# Patient Record
Sex: Female | Born: 1940
Health system: Southern US, Community
[De-identification: ages and names within clinical notes are randomized; demographics above are authoritative.]

## PROBLEM LIST (undated history)

## (undated) DIAGNOSIS — M810 Age-related osteoporosis without current pathological fracture: Secondary | ICD-10-CM

## (undated) DIAGNOSIS — R519 Headache, unspecified: Secondary | ICD-10-CM

## (undated) DIAGNOSIS — R51 Headache: Secondary | ICD-10-CM

## (undated) DIAGNOSIS — K219 Gastro-esophageal reflux disease without esophagitis: Secondary | ICD-10-CM

## (undated) DIAGNOSIS — K449 Diaphragmatic hernia without obstruction or gangrene: Secondary | ICD-10-CM

## (undated) DIAGNOSIS — G988 Other disorders of nervous system: Secondary | ICD-10-CM

## (undated) DIAGNOSIS — C50919 Malignant neoplasm of unspecified site of unspecified female breast: Secondary | ICD-10-CM

## (undated) DIAGNOSIS — K5790 Diverticulosis of intestine, part unspecified, without perforation or abscess without bleeding: Secondary | ICD-10-CM

## (undated) DIAGNOSIS — D126 Benign neoplasm of colon, unspecified: Secondary | ICD-10-CM

## (undated) DIAGNOSIS — N319 Neuromuscular dysfunction of bladder, unspecified: Secondary | ICD-10-CM

## (undated) DIAGNOSIS — G4700 Insomnia, unspecified: Secondary | ICD-10-CM

## (undated) DIAGNOSIS — D6851 Activated protein C resistance: Secondary | ICD-10-CM

## (undated) DIAGNOSIS — R32 Unspecified urinary incontinence: Secondary | ICD-10-CM

## (undated) HISTORY — PX: OTHER SURGICAL HISTORY: SHX169

## (undated) HISTORY — DX: Unspecified urinary incontinence: R32

## (undated) HISTORY — DX: Headache: R51

## (undated) HISTORY — DX: Age-related osteoporosis without current pathological fracture: M81.0

## (undated) HISTORY — DX: Insomnia, unspecified: G47.00

## (undated) HISTORY — DX: Neuromuscular dysfunction of bladder, unspecified: N31.9

## (undated) HISTORY — DX: Other disorders of nervous system: G98.8

## (undated) HISTORY — DX: Diverticulosis of intestine, part unspecified, without perforation or abscess without bleeding: K57.90

## (undated) HISTORY — DX: Benign neoplasm of colon, unspecified: D12.6

## (undated) HISTORY — DX: Activated protein C resistance: D68.51

## (undated) HISTORY — DX: Gastro-esophageal reflux disease without esophagitis: K21.9

## (undated) HISTORY — PX: TUBAL LIGATION: SHX77

## (undated) HISTORY — DX: Diaphragmatic hernia without obstruction or gangrene: K44.9

## (undated) HISTORY — DX: Malignant neoplasm of unspecified site of unspecified female breast: C50.919

## (undated) HISTORY — DX: Headache, unspecified: R51.9

## (undated) HISTORY — PX: SALPINGOOPHORECTOMY: SHX82

---

## 1994-10-12 HISTORY — PX: PARTIAL HYSTERECTOMY: SHX80

## 2008-03-17 ENCOUNTER — Emergency Department (HOSPITAL_COMMUNITY): Admission: EM | Admit: 2008-03-17 | Discharge: 2008-03-17 | Payer: Self-pay | Admitting: Emergency Medicine

## 2008-08-28 ENCOUNTER — Encounter: Admission: RE | Admit: 2008-08-28 | Discharge: 2008-09-26 | Payer: Self-pay | Admitting: Family Medicine

## 2008-11-13 ENCOUNTER — Ambulatory Visit (HOSPITAL_COMMUNITY): Admission: RE | Admit: 2008-11-13 | Discharge: 2008-11-13 | Payer: Self-pay | Admitting: Neurology

## 2010-03-27 ENCOUNTER — Ambulatory Visit (HOSPITAL_COMMUNITY): Admission: RE | Admit: 2010-03-27 | Discharge: 2010-03-27 | Payer: Self-pay | Admitting: Gastroenterology

## 2010-10-08 ENCOUNTER — Encounter
Admission: RE | Admit: 2010-10-08 | Discharge: 2010-10-09 | Payer: Self-pay | Source: Home / Self Care | Attending: Family Medicine | Admitting: Family Medicine

## 2010-10-12 ENCOUNTER — Encounter
Admission: RE | Admit: 2010-10-12 | Discharge: 2010-11-11 | Payer: Self-pay | Source: Home / Self Care | Attending: Family Medicine | Admitting: Family Medicine

## 2010-10-13 ENCOUNTER — Encounter
Admission: RE | Admit: 2010-10-13 | Discharge: 2010-11-11 | Payer: Self-pay | Source: Home / Self Care | Attending: Family Medicine | Admitting: Family Medicine

## 2010-10-20 ENCOUNTER — Encounter: Admit: 2010-10-20 | Payer: Self-pay | Admitting: Family Medicine

## 2010-10-22 ENCOUNTER — Encounter: Admit: 2010-10-22 | Payer: Self-pay | Admitting: Family Medicine

## 2010-10-28 ENCOUNTER — Encounter: Admit: 2010-10-28 | Payer: Self-pay | Admitting: Family Medicine

## 2010-10-31 ENCOUNTER — Encounter: Admit: 2010-10-31 | Payer: Self-pay | Admitting: Family Medicine

## 2010-11-10 ENCOUNTER — Encounter: Admit: 2010-11-10 | Payer: Self-pay | Admitting: Family Medicine

## 2010-11-12 ENCOUNTER — Ambulatory Visit: Payer: Medicare Other | Attending: Family Medicine | Admitting: Physical Therapy

## 2010-11-12 ENCOUNTER — Ambulatory Visit: Payer: Medicare Other | Admitting: Physical Therapy

## 2010-11-12 DIAGNOSIS — M25559 Pain in unspecified hip: Secondary | ICD-10-CM | POA: Insufficient documentation

## 2010-11-12 DIAGNOSIS — IMO0001 Reserved for inherently not codable concepts without codable children: Secondary | ICD-10-CM | POA: Insufficient documentation

## 2010-11-12 DIAGNOSIS — M25659 Stiffness of unspecified hip, not elsewhere classified: Secondary | ICD-10-CM | POA: Insufficient documentation

## 2010-11-14 ENCOUNTER — Ambulatory Visit: Payer: Medicare Other | Admitting: Physical Therapy

## 2010-11-18 ENCOUNTER — Ambulatory Visit: Payer: Medicare Other | Admitting: Physical Therapy

## 2010-11-19 ENCOUNTER — Ambulatory Visit: Payer: Medicare Other | Admitting: Physical Therapy

## 2010-11-19 ENCOUNTER — Encounter: Payer: Medicare Other | Admitting: Physical Therapy

## 2010-11-20 ENCOUNTER — Ambulatory Visit: Payer: Medicare Other | Admitting: Physical Therapy

## 2010-11-24 ENCOUNTER — Ambulatory Visit: Payer: Medicare Other | Admitting: Physical Therapy

## 2010-11-25 ENCOUNTER — Ambulatory Visit: Payer: Medicare Other | Admitting: Physical Therapy

## 2010-11-26 ENCOUNTER — Ambulatory Visit: Payer: Medicare Other | Admitting: Physical Therapy

## 2010-11-26 ENCOUNTER — Encounter: Payer: Medicare Other | Admitting: Physical Therapy

## 2010-11-27 ENCOUNTER — Ambulatory Visit: Payer: Medicare Other | Admitting: Physical Therapy

## 2010-12-01 ENCOUNTER — Ambulatory Visit: Payer: Medicare Other | Admitting: Physical Therapy

## 2010-12-02 ENCOUNTER — Ambulatory Visit: Payer: Medicare Other | Admitting: Physical Therapy

## 2010-12-02 ENCOUNTER — Encounter: Payer: Medicare Other | Admitting: Physical Therapy

## 2010-12-04 ENCOUNTER — Ambulatory Visit: Payer: Medicare Other | Admitting: Physical Therapy

## 2010-12-09 ENCOUNTER — Ambulatory Visit: Payer: Medicare Other | Admitting: Physical Therapy

## 2011-07-09 LAB — CBC
Hemoglobin: 13.5
MCHC: 34.3
Platelets: 207
RDW: 13.7
WBC: 7.7

## 2011-07-09 LAB — DIFFERENTIAL
Basophils Relative: 0
Eosinophils Absolute: 0
Lymphs Abs: 0.6 — ABNORMAL LOW
Monocytes Absolute: 0.3
Monocytes Relative: 3

## 2011-07-09 LAB — URINALYSIS, ROUTINE W REFLEX MICROSCOPIC
Ketones, ur: >80 — AB
Nitrite: NEGATIVE
Protein, ur: NEGATIVE
Urobilinogen, UA: 0.2
pH: 7

## 2011-07-09 LAB — COMPREHENSIVE METABOLIC PANEL
ALT: 33
AST: 27
Albumin: 4
Alkaline Phosphatase: 46
Calcium: 9.2
GFR calc Af Amer: >60
GFR calc non Af Amer: >60
Potassium: 3.8
Sodium: 139
Total Protein: 6.6

## 2011-07-09 LAB — LIPASE, BLOOD: Lipase: 26

## 2012-07-28 ENCOUNTER — Other Ambulatory Visit: Payer: Self-pay | Admitting: Dermatology

## 2012-10-17 ENCOUNTER — Other Ambulatory Visit: Payer: Self-pay | Admitting: Gastroenterology

## 2012-10-17 ENCOUNTER — Ambulatory Visit
Admission: RE | Admit: 2012-10-17 | Discharge: 2012-10-17 | Disposition: A | Discharge status: Home / Self Care | Payer: Medicare Other | Source: Ambulatory Visit | Attending: Gastroenterology | Admitting: Gastroenterology

## 2012-10-17 DIAGNOSIS — R05 Cough: Secondary | ICD-10-CM

## 2012-10-17 DIAGNOSIS — R059 Cough, unspecified: Secondary | ICD-10-CM

## 2012-11-01 ENCOUNTER — Other Ambulatory Visit: Payer: Self-pay | Admitting: Neurology

## 2012-11-01 DIAGNOSIS — R32 Unspecified urinary incontinence: Secondary | ICD-10-CM

## 2012-11-01 DIAGNOSIS — Q068 Other specified congenital malformations of spinal cord: Secondary | ICD-10-CM

## 2012-11-07 ENCOUNTER — Ambulatory Visit
Admission: RE | Admit: 2012-11-07 | Discharge: 2012-11-07 | Disposition: A | Discharge status: Home / Self Care | Payer: Medicare Other | Source: Ambulatory Visit | Attending: Neurology | Admitting: Neurology

## 2012-11-07 DIAGNOSIS — Q068 Other specified congenital malformations of spinal cord: Secondary | ICD-10-CM

## 2012-11-07 DIAGNOSIS — R32 Unspecified urinary incontinence: Secondary | ICD-10-CM

## 2012-11-07 MED ORDER — GADOBENATE DIMEGLUMINE 529 MG/ML IV SOLN
12.0000 mL | Freq: Once | INTRAVENOUS | Status: AC | PRN
  Administered 2012-11-07: 12 mL via INTRAVENOUS

## 2013-02-22 DIAGNOSIS — D179 Benign lipomatous neoplasm, unspecified: Secondary | ICD-10-CM | POA: Insufficient documentation

## 2013-06-30 ENCOUNTER — Other Ambulatory Visit: Payer: Self-pay | Admitting: Gastroenterology

## 2013-08-14 ENCOUNTER — Other Ambulatory Visit: Payer: Self-pay | Admitting: Dermatology

## 2014-03-20 ENCOUNTER — Ambulatory Visit (INDEPENDENT_AMBULATORY_CARE_PROVIDER_SITE_OTHER)
Admission: RE | Admit: 2014-03-20 | Discharge: 2014-03-20 | Disposition: A | Discharge status: Home / Self Care | Payer: Commercial Managed Care - HMO | Source: Ambulatory Visit | Attending: Emergency Medicine | Admitting: Emergency Medicine

## 2014-03-20 ENCOUNTER — Encounter: Payer: Self-pay | Admitting: Emergency Medicine

## 2014-03-20 ENCOUNTER — Ambulatory Visit (INDEPENDENT_AMBULATORY_CARE_PROVIDER_SITE_OTHER): Payer: Commercial Managed Care - HMO | Admitting: Emergency Medicine

## 2014-03-20 VITALS — BP 128/88 | HR 77 | Ht 63.5 in | Wt 138.0 lb

## 2014-03-20 DIAGNOSIS — R05 Cough: Secondary | ICD-10-CM

## 2014-03-20 DIAGNOSIS — R059 Cough, unspecified: Secondary | ICD-10-CM

## 2014-03-20 MED ORDER — FLUTICASONE PROPIONATE 50 MCG/ACT NA SUSP: 2.0000 | Freq: Every day | NASAL | Status: DC

## 2014-03-20 MED ORDER — HYDROCODONE-HOMATROPINE 5-1.5 MG/5ML PO SYRP: 5.0000 mL | ORAL_SOLUTION | Freq: Four times a day (QID) | ORAL | Status: DC | PRN

## 2014-03-20 NOTE — Patient Instructions (Signed)
CXR today Please increase your omeprazole to twice a day Go back on your fexofenadine 180mg  daily Start fluticasone nasal spray 2 sprays daily Use hycodan as needed to suppress you cough Practice voice rest as recommended for a weekend Follow with Dr Lamonte Sakai in 1 month

## 2014-03-20 NOTE — Assessment & Plan Note (Addendum)
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 Using her voice, GERD, allergic rhinitis are all contributing and sustaining her upper airway irritation. We discussed cyclical cough and the role each of these factors was playing. I believe she needs cough suppression and voice rest while we work on eliminating her contributors especially GERD.   CXR today Please increase your omeprazole to twice a day Go back on your fexofenadine 180mg  daily Start fluticasone nasal spray 2 sprays daily Use hycodan as needed to suppress you cough Practice voice rest as recommended for a weekend Follow with Dr Lamonte Sakai in 1 month

## 2014-03-20 NOTE — Progress Notes (Signed)
Subjective:    Patient ID: Regina [name], female    DOB: May 25, 1941, 73 y.o.   MRN: [id number]  HPI 73 yo former smoker (8 pk-yr), little PMH. She is referred for chronic cough. She has had trouble with cough for years, but since May '15 she has had more. 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  It appears to be paroxysmal, is often productive of clear to white mucous. She has GERD and has moderate control. She switched omeprazole from bid to qd several months ago without any apparent worsening. She was tried on allegra, albuterol. The SABA didn't seem to help.   Review of Systems  Constitutional: Negative for fever and unexpected weight change.  HENT: Positive for dental problem and sore throat. Negative for congestion, ear pain, nosebleeds, postnasal drip, rhinorrhea, sinus pressure, sneezing and trouble swallowing.   Eyes: Negative for redness and itching.  Respiratory: Positive for shortness of breath. Negative for cough, chest tightness and wheezing.   Cardiovascular: Negative for palpitations and leg swelling.  Gastrointestinal: Positive for abdominal distention. Negative for nausea and vomiting.       Reflux   Genitourinary: Negative for dysuria.  Musculoskeletal: Negative for joint swelling.  Skin: Negative for rash.  Neurological: Negative for headaches.  Hematological: Does not bruise/bleed easily.  Psychiatric/Behavioral: Negative for dysphoric mood. The patient is not nervous/anxious.    Past Medical History  Diagnosis Date  . Nervous system disorder     spinal cord tethered     Family History  Problem Relation Age of Onset  . Asthma Mother   . COPD Mother      History   Social History  . Marital Status: Married    Spouse Name: N/A    Number of Children: N/A  . Years of Education: N/A   Occupational History  . Not on file.   Social History Main Topics  . Smoking status: Former Smoker -- 2.00 packs/day  for 4 years    Types: Cigarettes    Quit date: 12/10/1972  . Smokeless tobacco: Not on file  . Alcohol Use: Yes     Comment: social  . Drug Use: No  . Sexual Activity: Not on file   Other Topics Concern  . Not on file   Social History Narrative  . No narrative on file     Allergies  Allergen Reactions  . Ketoprofen     Stomach issues  . Morphine And Related      No outpatient prescriptions prior to visit.   No facility-administered medications prior to visit.       Objective:   Physical Exam Filed Vitals:   03/20/14 1013  BP: 128/88  Pulse: 77  Height: 5' 3.5" (1.613 m)  Weight: 138 lb (62.596 kg)  SpO2: 97%   Gen: Pleasant, well-nourished, in no distress,  normal affect  ENT: No lesions,  mouth clear,  oropharynx clear, no postnasal drip  Neck: No JVD, no TMG, no carotid bruits  Lungs: No use of accessory muscles, clear without rales or rhonchi  Cardiovascular: RRR, heart sounds normal, no murmur or gallops, no peripheral edema  Musculoskeletal: No deformities, no cyanosis or clubbing  Neuro: alert, non focal  Skin: Warm, no lesions or rashes      Assessment & Plan:  Cough Sounds multifactorial with 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 Using her voice, GERD, allergic rhinitis are all  contributing and sustaining her upper airway irritation. We discussed cyclical cough and the role each of these factors was playing. I believe she needs cough suppression and voice rest while we work on eliminating her contributors especially GERD.   CXR today Please increase your omeprazole to twice a day Go back on your fexofenadine 180mg  daily Start fluticasone nasal spray 2 sprays daily Use hycodan as needed to suppress you cough Practice voice rest as recommended for a weekend Follow with Dr Lamonte Sakai in 1 month

## 2014-04-19 ENCOUNTER — Ambulatory Visit (INDEPENDENT_AMBULATORY_CARE_PROVIDER_SITE_OTHER): Payer: Commercial Managed Care - HMO | Admitting: Emergency Medicine

## 2014-04-19 ENCOUNTER — Encounter: Payer: Self-pay | Admitting: Emergency Medicine

## 2014-04-19 VITALS — BP 122/78 | HR 77 | Ht 63.5 in | Wt 138.0 lb

## 2014-04-19 DIAGNOSIS — R05 Cough: Secondary | ICD-10-CM

## 2014-04-19 DIAGNOSIS — R059 Cough, unspecified: Secondary | ICD-10-CM

## 2014-04-19 MED ORDER — PANTOPRAZOLE SODIUM 40 MG PO TBEC: 40.0000 mg | DELAYED_RELEASE_TABLET | Freq: Every day | ORAL | Status: DC

## 2014-04-19 NOTE — Progress Notes (Signed)
   Subjective:    Patient ID: Regina [name], female    DOB: October 22, 1940, 73 y.o.   MRN: [id number]  Cough Associated symptoms include a sore throat and shortness of breath. Pertinent negatives include no ear pain, eye redness, fever, headaches, postnasal drip, rash, rhinorrhea or wheezing.   73 yo former smoker (8 pk-yr), little PMH. She is referred for chronic cough. She has had trouble with cough for years, but since May '15 she has had more. 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  It appears to be paroxysmal, is often productive of clear to white mucous. She has GERD and has moderate control. She switched omeprazole from bid to qd several months ago without any apparent worsening. She was tried on allegra, albuterol. The SABA didn't seem to help.   ROV 04/19/14 -- follows for chronic cough, suspected allergic rhinitis. We empirically increased omeprazole, added fluticasone (taking prn) to fexofenadine. She had itching with hydrcodone, could not take. She has continued to cough - was treated for an acute bronchitis end of June with pred and azithro.    Review of Systems  Constitutional: Negative for fever and unexpected weight change.  HENT: Positive for dental problem and sore throat. Negative for congestion, ear pain, nosebleeds, postnasal drip, rhinorrhea, sinus pressure, sneezing and trouble swallowing.   Eyes: Negative for redness and itching.  Respiratory: Positive for cough and shortness of breath. Negative for chest tightness and wheezing.   Cardiovascular: Negative for palpitations and leg swelling.  Gastrointestinal: Positive for abdominal distention. Negative for nausea and vomiting.       Reflux   Genitourinary: Negative for dysuria.  Musculoskeletal: Negative for joint swelling.  Skin: Negative for rash.  Neurological: Negative for headaches.  Hematological: Does not bruise/bleed easily.  Psychiatric/Behavioral: Negative for  dysphoric mood. The patient is not nervous/anxious.        Objective:   Physical Exam Filed Vitals:   04/19/14 0946  BP: 122/78  Pulse: 77  Height: 5' 3.5" (1.613 m)  Weight: 138 lb (62.596 kg)  SpO2: 100%   Gen: Pleasant, well-nourished, in no distress,  normal affect  ENT: No lesions,  mouth clear,  oropharynx clear, no postnasal drip  Neck: No JVD, no TMG, no carotid bruits  Lungs: No use of accessory muscles, clear without rales or rhonchi  Cardiovascular: RRR, heart sounds normal, no murmur or gallops, no peripheral edema  Musculoskeletal: No deformities, no cyanosis or clubbing  Neuro: alert, non focal  Skin: Warm, no lesions or rashes      Assessment & Plan:  Cough - we will perform full PFT - continue allegra, start taking nasal steroid every day  - raise head of bed - GERD precautions.  - change omeprazole to pantoprazole  - follow 1 month

## 2014-04-19 NOTE — Patient Instructions (Signed)
Please continue allegra Start taking your fluticasone nasal spray every day Please stop your omeprazole. We will start pantoprazole 40mg . Take this twice a day for the first 10 days, then change to daily. If you have any reflux symptoms on the lower dose then go back to twice a day We will perform full PFT at your next office visit Follow with Dr Lamonte Sakai in 1 month with full PFT

## 2014-04-19 NOTE — Assessment & Plan Note (Signed)
-   we will perform full PFT - continue allegra, start taking nasal steroid every day  - raise head of bed - GERD precautions.  - change omeprazole to pantoprazole  - follow 1 month

## 2014-04-24 ENCOUNTER — Telehealth: Payer: Self-pay | Admitting: Emergency Medicine

## 2014-04-24 ENCOUNTER — Encounter (INDEPENDENT_AMBULATORY_CARE_PROVIDER_SITE_OTHER): Payer: Self-pay

## 2014-04-24 NOTE — Telephone Encounter (Signed)
Spoke with pt and advised her of RB's rec's. Confirmed appointment for 06/05/14 and advised pt to call if symptoms no better within 10 days. Pt verbalized understanding

## 2014-04-24 NOTE — Telephone Encounter (Signed)
Per OV 04/19/14; Please continue allegra Start taking your fluticasone nasal spray every day Please stop your omeprazole. We will start pantoprazole 40mg . Take this twice a day for the first 10 days, then change to daily. If you have any reflux symptoms on the lower dose then go back to twice a day We will perform full PFT at your next office visit Follow with Dr Lamonte Sakai in 1 month with full PFT --   Called spoke with pt. She reports the new PPI she was placed on, she is now coughing more than when she was on the omperazole. It is a constant cough and happens all during the day and night. Pt is requesting further recs. Please advise RB thanks  Allergies  Allergen Reactions  . Ketoprofen     Stomach issues  . Morphine And Related

## 2014-04-24 NOTE — Telephone Encounter (Signed)
There could be multiple reasons why she is coughing more, but I would like for her to stop the pantoprazole and go back to omeprazole. Please have her make this change and ensure that she has followup with me

## 2014-06-05 ENCOUNTER — Encounter: Payer: Self-pay | Admitting: Emergency Medicine

## 2014-06-05 ENCOUNTER — Ambulatory Visit (INDEPENDENT_AMBULATORY_CARE_PROVIDER_SITE_OTHER): Payer: Commercial Managed Care - HMO | Admitting: Emergency Medicine

## 2014-06-05 VITALS — BP 118/80 | HR 70 | Temp 98.2°F | Ht 63.0 in | Wt 140.0 lb

## 2014-06-05 DIAGNOSIS — R059 Cough, unspecified: Secondary | ICD-10-CM

## 2014-06-05 DIAGNOSIS — R05 Cough: Secondary | ICD-10-CM

## 2014-06-05 DIAGNOSIS — J4520 Mild intermittent asthma, uncomplicated: Secondary | ICD-10-CM

## 2014-06-05 DIAGNOSIS — J45909 Unspecified asthma, uncomplicated: Secondary | ICD-10-CM

## 2014-06-05 LAB — PULMONARY FUNCTION TEST
DL/VA % pred: 96 %
DL/VA: 4.52 ml/min/mmHg/L
DLCO unc % pred: 77 %
DLCO unc: 17.68 ml/min/mmHg
FEF 25-75 PRE: 1.57 L/s
FEF 25-75 Post: 2.4 L/sec
FEF2575-%CHANGE-POST: 53 %
FEF2575-%Pred-Post: 132 %
FEF2575-%Pred-Pre: 86 %
FEV1-%CHANGE-POST: 14 %
FEV1-%PRED-PRE: 84 %
FEV1-%Pred-Post: 97 %
FEV1-Post: 2.09 L
FEV1-Pre: 1.82 L
FEV1FVC-%CHANGE-POST: 3 %
FEV1FVC-%Pred-Pre: 103 %
FEV6-%Change-Post: 11 %
FEV6-%PRED-POST: 94 %
FEV6-%Pred-Pre: 84 %
FEV6-PRE: 2.31 L
FEV6-Post: 2.58 L
FEV6FVC-%Change-Post: 0 %
FEV6FVC-%Pred-Post: 105 %
FEV6FVC-%Pred-Pre: 104 %
FVC-%Change-Post: 11 %
FVC-%Pred-Post: 90 %
FVC-%Pred-Pre: 81 %
FVC-POST: 2.58 L
FVC-Pre: 2.32 L
POST FEV1/FVC RATIO: 81 %
POST FEV6/FVC RATIO: 100 %
Pre FEV1/FVC ratio: 79 %
Pre FEV6/FVC Ratio: 100 %
RV % pred: 84 %
RV: 1.83 L
TLC % PRED: 87 %
TLC: 4.27 L

## 2014-06-05 NOTE — Assessment & Plan Note (Signed)
-   continue rx for allergies, decrease omeprazole back to qd - use albuterol to treat any component of lower airways disease.  - rov 6

## 2014-06-05 NOTE — Patient Instructions (Signed)
We will continue your fluticasone nasal spray, 1 spray twice a day Continue your allegra Decrease your omeprazole to once a day Try using albuterol 2 puffs if needed for coughing spells or shortness of breath Follow with Dr Lamonte Sakai in 6 months or sooner if you have any problems

## 2014-06-05 NOTE — Assessment & Plan Note (Signed)
Based on BD response on PFT today. Suspect her main manifestation (if any) is paroxysms of cough.  - will try to target her albuterol use to her coughing spells, see if she benefits.

## 2014-06-05 NOTE — Progress Notes (Signed)
PFT done today. 

## 2014-06-05 NOTE — Progress Notes (Signed)
   Subjective:    Patient ID: Regina [name], female    DOB: 1941-08-05, 73 y.o.   MRN: [id number]  Cough Associated symptoms include a sore throat and shortness of breath. Pertinent negatives include no ear pain, eye redness, fever, headaches, postnasal drip, rash, rhinorrhea or wheezing.   73 yo former smoker (8 pk-yr), little PMH. She is referred for chronic cough. She has had trouble with cough for years, but since May '15 she has had more. 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  It appears to be paroxysmal, is often productive of clear to white mucous. She has GERD and has moderate control. She switched omeprazole from bid to qd several months ago without any apparent worsening. She was tried on allegra, albuterol. The SABA didn't seem to help.   ROV 04/19/14 -- follows for chronic cough, suspected allergic rhinitis. We empirically increased omeprazole, added fluticasone (taking prn) to fexofenadine. She had itching with hydrcodone, could not take. She has continued to cough - was treated for an acute bronchitis end of June with pred and azithro.   ROV 06/05/14 -- follow up visit for cough in setting rhinitis, GERD. We performed PFT to assess for AFL today >> Mild AFL based on positive BD response, normal volumes, decreased DLCO with correction. She tells me that she has been doing better, less cough. She believes that she benefited from the fluticasone. She wasn't able to stay on the pantoprazole, back to omeprazole.    Review of Systems  Constitutional: Negative for fever and unexpected weight change.  HENT: Positive for dental problem and sore throat. Negative for congestion, ear pain, nosebleeds, postnasal drip, rhinorrhea, sinus pressure, sneezing and trouble swallowing.   Eyes: Negative for redness and itching.  Respiratory: Positive for cough and shortness of breath. Negative for chest tightness and wheezing.   Cardiovascular: Negative  for palpitations and leg swelling.  Gastrointestinal: Positive for abdominal distention. Negative for nausea and vomiting.       Reflux   Genitourinary: Negative for dysuria.  Musculoskeletal: Negative for joint swelling.  Skin: Negative for rash.  Neurological: Negative for headaches.  Hematological: Does not bruise/bleed easily.  Psychiatric/Behavioral: Negative for dysphoric mood. The patient is not nervous/anxious.        Objective:   Physical Exam Filed Vitals:   06/05/14 1103  BP: 118/80  Pulse: 70  Temp: 98.2 F (36.8 C)  TempSrc: Oral  Height: 5\' 3"  (1.6 m)  Weight: 140 lb (63.504 kg)  SpO2: 99%   Gen: Pleasant, well-nourished, in no distress,  normal affect  ENT: No lesions,  mouth clear,  oropharynx clear, no postnasal drip  Neck: No JVD, no TMG, no carotid bruits  Lungs: No use of accessory muscles, clear without rales or rhonchi  Cardiovascular: RRR, heart sounds normal, no murmur or gallops, no peripheral edema  Musculoskeletal: No deformities, no cyanosis or clubbing  Neuro: alert, non focal  Skin: Warm, no lesions or rashes      Assessment & Plan:  Asthma, mild intermittent Based on BD response on PFT today. Suspect her main manifestation (if any) is paroxysms of cough.  - will try to target her albuterol use to her coughing spells, see if she benefits.   Cough - continue rx for allergies, decrease omeprazole back to qd - use albuterol to treat any component of lower airways disease.  - rov 6

## 2014-07-19 ENCOUNTER — Other Ambulatory Visit (HOSPITAL_COMMUNITY): Payer: Self-pay | Admitting: Urology

## 2014-07-19 DIAGNOSIS — N281 Cyst of kidney, acquired: Secondary | ICD-10-CM

## 2014-08-01 ENCOUNTER — Other Ambulatory Visit (HOSPITAL_COMMUNITY): Payer: Self-pay | Admitting: Urology

## 2014-08-01 ENCOUNTER — Ambulatory Visit (HOSPITAL_COMMUNITY)
Admission: RE | Admit: 2014-08-01 | Discharge: 2014-08-01 | Disposition: A | Discharge status: Home / Self Care | Payer: Medicare PPO | Source: Ambulatory Visit | Attending: Urology | Admitting: Urology

## 2014-08-01 DIAGNOSIS — D3502 Benign neoplasm of left adrenal gland: Secondary | ICD-10-CM | POA: Diagnosis not present

## 2014-08-01 DIAGNOSIS — K7689 Other specified diseases of liver: Secondary | ICD-10-CM | POA: Diagnosis not present

## 2014-08-01 DIAGNOSIS — N289 Disorder of kidney and ureter, unspecified: Secondary | ICD-10-CM | POA: Insufficient documentation

## 2014-08-01 DIAGNOSIS — N281 Cyst of kidney, acquired: Secondary | ICD-10-CM

## 2014-08-01 MED ORDER — GADOBENATE DIMEGLUMINE 529 MG/ML IV SOLN
13.0000 mL | Freq: Once | INTRAVENOUS | Status: AC | PRN
  Administered 2014-08-01: 13 mL via INTRAVENOUS

## 2014-09-19 NOTE — Progress Notes (Signed)
Quick Note:    Discussed at OV  ______

## 2014-11-07 ENCOUNTER — Other Ambulatory Visit: Payer: Self-pay | Admitting: Dermatology

## 2014-12-10 ENCOUNTER — Encounter: Payer: Self-pay | Admitting: Emergency Medicine

## 2014-12-24 ENCOUNTER — Ambulatory Visit: Payer: Commercial Managed Care - HMO | Admitting: Emergency Medicine

## 2015-01-31 ENCOUNTER — Encounter: Payer: Self-pay | Admitting: Emergency Medicine

## 2015-01-31 ENCOUNTER — Ambulatory Visit (INDEPENDENT_AMBULATORY_CARE_PROVIDER_SITE_OTHER): Payer: PPO | Admitting: Emergency Medicine

## 2015-01-31 VITALS — BP 124/64 | HR 82 | Ht 67.0 in | Wt 142.6 lb

## 2015-01-31 DIAGNOSIS — R05 Cough: Secondary | ICD-10-CM

## 2015-01-31 DIAGNOSIS — J4520 Mild intermittent asthma, uncomplicated: Secondary | ICD-10-CM

## 2015-01-31 DIAGNOSIS — R059 Cough, unspecified: Secondary | ICD-10-CM

## 2015-01-31 MED ORDER — FEXOFENADINE HCL 180 MG PO TABS: 180.0000 mg | ORAL_TABLET | Freq: Every day | ORAL | Status: DC

## 2015-01-31 NOTE — Assessment & Plan Note (Signed)
Continue albuterol as needed 

## 2015-01-31 NOTE — Assessment & Plan Note (Signed)
Continue to suspect that this is largely due to upper airway irritation. She has been on and off her PPI and her allergy routine. She currently inhales and essential oil which concerns me for a risk for lipoid pneumonia. I think the best approach currently would be to restart her omeprazole twice a day, encouraged her to adhere to a GERD diet and reflux precautions. We will also restart her fluticasone nasal spray and her fexofenadine. She can continue to use her albuterol if needed. If she does not improve then I believe she needs an airway inspection with bronchoscopy. We will discuss this at her next visit.

## 2015-01-31 NOTE — Progress Notes (Signed)
Subjective:    Patient ID: Regina [name], female    DOB: [birth year], 74 y.o.   MRN: [id number]  Cough Associated symptoms include shortness of breath. Pertinent negatives include no ear pain, eye redness, fever, headaches, postnasal drip, rash, rhinorrhea, sore throat or wheezing.   74 yo former smoker (8 pk-yr), little PMH. She is referred for chronic cough. She has had trouble with cough for years, but since May '15 she has had more. 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  It appears to be paroxysmal, is often productive of clear to white mucous. She has GERD and has moderate control. She switched omeprazole from bid to qd several months ago without any apparent worsening. She was tried on allegra, albuterol. The SABA didn't seem to help.   ROV 04/19/14 -- follows for chronic cough, suspected allergic rhinitis. We empirically increased omeprazole, added fluticasone (taking prn) to fexofenadine. She had itching with hydrcodone, could not take. She has continued to cough - was treated for an acute bronchitis end of June with pred and azithro.   ROV 06/05/14 -- follow up visit for cough in setting rhinitis, GERD. We performed PFT to assess for AFL today >> Mild AFL based on positive BD response, normal volumes, decreased DLCO with correction. She tells me that she has been doing better, less cough. She believes that she benefited from the fluticasone. She wasn't able to stay on the pantoprazole, back to omeprazole.   ROV 01/31/15 -- Follow-up visit for mild obstructive lung disease and cough in the setting of allergic rhinitis and GERD.  She has a positive bronchodilator response on her pulmonary function testing. She has some spells of coughing that seem to happen after breakfast, when sitting up in bed. She is taking her omeprazole 40mg  in the evening before supper. She has purchased an air filter. She has used albuterol with some success. She  stopped allegra, uses flonase prn.    Review of Systems  Constitutional: Negative for fever and unexpected weight change.  HENT: Negative for congestion, dental problem, ear pain, nosebleeds, postnasal drip, rhinorrhea, sinus pressure, sneezing, sore throat and trouble swallowing.   Eyes: Negative for redness and itching.  Respiratory: Positive for cough and shortness of breath. Negative for chest tightness and wheezing.   Cardiovascular: Negative for palpitations and leg swelling.  Gastrointestinal: Negative for nausea, vomiting and abdominal distention.       Reflux   Genitourinary: Negative for dysuria.  Musculoskeletal: Negative for joint swelling.  Skin: Negative for rash.  Neurological: Negative for headaches.  Hematological: Does not bruise/bleed easily.  Psychiatric/Behavioral: Negative for dysphoric mood. The patient is not nervous/anxious.        Objective:   Physical Exam Filed Vitals:   01/31/15 1625  BP: 124/64  Pulse: 82  Height: 5\' 7"  (1.702 m)  Weight: 142 lb 9.6 oz (64.683 kg)  SpO2: 100%    Gen: Pleasant, well-nourished, in no distress,  normal affect  ENT: No lesions,  mouth clear,  oropharynx narrow but clear, no postnasal drip  Neck: No JVD, no TMG, no carotid bruits  Lungs: No use of accessory muscles, clear without rales or rhonchi  Cardiovascular: RRR, heart sounds normal, no murmur or gallops, no peripheral edema  Musculoskeletal: No deformities, no cyanosis or clubbing   Neuro: alert, non focal  Skin: Warm, no lesions or rashes      Assessment & Plan:  Cough Continue to suspect that  this is largely due to upper airway irritation. She has been on and off her PPI and her allergy routine. She currently inhales and essential oil which concerns me for a risk for lipoid pneumonia. I think the best approach currently would be to restart her omeprazole twice a day, encouraged her to adhere to a GERD diet and reflux precautions. We will also  restart her fluticasone nasal spray and her fexofenadine. She can continue to use her albuterol if needed. If she does not improve then I believe she needs an airway inspection with bronchoscopy. We will discuss this at her next visit.    Asthma, mild intermittent Continue albuterol as needed    Over 75% of this 30 minute visit was spent counseling and discussing strategies to avoid upper airway irritation and cough.

## 2015-01-31 NOTE — Patient Instructions (Signed)
Please increase your omeprazole back to 40mg  twice a day Restart your flonase every day Start fexofenadine 180mg  daily Use albuterol 2 puffs as needed for your breathing or for cough We may consider performing a bronchoscopy (airway inspection) at some point in the future.  Follow with Dr Lamonte Sakai in 3 months or sooner if you have any problems.

## 2015-03-07 ENCOUNTER — Other Ambulatory Visit: Payer: Self-pay | Admitting: Emergency Medicine

## 2015-08-01 DIAGNOSIS — H02831 Dermatochalasis of right upper eyelid: Secondary | ICD-10-CM | POA: Insufficient documentation

## 2015-10-17 DIAGNOSIS — N281 Cyst of kidney, acquired: Secondary | ICD-10-CM | POA: Diagnosis not present

## 2015-11-13 DIAGNOSIS — Z23 Encounter for immunization: Secondary | ICD-10-CM | POA: Diagnosis not present

## 2015-11-13 DIAGNOSIS — D225 Melanocytic nevi of trunk: Secondary | ICD-10-CM | POA: Diagnosis not present

## 2015-11-13 DIAGNOSIS — D485 Neoplasm of uncertain behavior of skin: Secondary | ICD-10-CM | POA: Diagnosis not present

## 2015-11-13 DIAGNOSIS — Z85828 Personal history of other malignant neoplasm of skin: Secondary | ICD-10-CM | POA: Diagnosis not present

## 2015-11-13 DIAGNOSIS — L821 Other seborrheic keratosis: Secondary | ICD-10-CM | POA: Diagnosis not present

## 2015-11-13 DIAGNOSIS — L309 Dermatitis, unspecified: Secondary | ICD-10-CM | POA: Diagnosis not present

## 2015-11-13 DIAGNOSIS — L570 Actinic keratosis: Secondary | ICD-10-CM | POA: Diagnosis not present

## 2015-12-10 DIAGNOSIS — M8589 Other specified disorders of bone density and structure, multiple sites: Secondary | ICD-10-CM | POA: Diagnosis not present

## 2016-01-27 DIAGNOSIS — K30 Functional dyspepsia: Secondary | ICD-10-CM | POA: Diagnosis not present

## 2016-01-27 DIAGNOSIS — Z86010 Personal history of colonic polyps: Secondary | ICD-10-CM | POA: Diagnosis not present

## 2016-01-27 DIAGNOSIS — Z131 Encounter for screening for diabetes mellitus: Secondary | ICD-10-CM | POA: Diagnosis not present

## 2016-01-27 DIAGNOSIS — Z7189 Other specified counseling: Secondary | ICD-10-CM | POA: Diagnosis not present

## 2016-01-27 DIAGNOSIS — Z1389 Encounter for screening for other disorder: Secondary | ICD-10-CM | POA: Diagnosis not present

## 2016-01-27 DIAGNOSIS — N319 Neuromuscular dysfunction of bladder, unspecified: Secondary | ICD-10-CM | POA: Diagnosis not present

## 2016-01-27 DIAGNOSIS — M85851 Other specified disorders of bone density and structure, right thigh: Secondary | ICD-10-CM | POA: Diagnosis not present

## 2016-01-27 DIAGNOSIS — Q069 Congenital malformation of spinal cord, unspecified: Secondary | ICD-10-CM | POA: Diagnosis not present

## 2016-01-27 DIAGNOSIS — G43109 Migraine with aura, not intractable, without status migrainosus: Secondary | ICD-10-CM | POA: Diagnosis not present

## 2016-01-27 DIAGNOSIS — R05 Cough: Secondary | ICD-10-CM | POA: Diagnosis not present

## 2016-01-27 DIAGNOSIS — Z0000 Encounter for general adult medical examination without abnormal findings: Secondary | ICD-10-CM | POA: Diagnosis not present

## 2016-01-27 DIAGNOSIS — M25552 Pain in left hip: Secondary | ICD-10-CM | POA: Diagnosis not present

## 2016-02-06 DIAGNOSIS — M25552 Pain in left hip: Secondary | ICD-10-CM | POA: Diagnosis not present

## 2016-02-06 DIAGNOSIS — M25562 Pain in left knee: Secondary | ICD-10-CM | POA: Diagnosis not present

## 2016-03-13 DIAGNOSIS — G43801 Other migraine, not intractable, with status migrainosus: Secondary | ICD-10-CM | POA: Diagnosis not present

## 2016-03-13 DIAGNOSIS — H16223 Keratoconjunctivitis sicca, not specified as Sjogren's, bilateral: Secondary | ICD-10-CM | POA: Diagnosis not present

## 2016-03-13 DIAGNOSIS — Z83511 Family history of glaucoma: Secondary | ICD-10-CM | POA: Diagnosis not present

## 2016-03-13 DIAGNOSIS — H40023 Open angle with borderline findings, high risk, bilateral: Secondary | ICD-10-CM | POA: Diagnosis not present

## 2016-03-13 DIAGNOSIS — H2513 Age-related nuclear cataract, bilateral: Secondary | ICD-10-CM | POA: Diagnosis not present

## 2016-03-19 DIAGNOSIS — M25552 Pain in left hip: Secondary | ICD-10-CM | POA: Diagnosis not present

## 2016-03-19 DIAGNOSIS — M25562 Pain in left knee: Secondary | ICD-10-CM | POA: Diagnosis not present

## 2016-03-23 DIAGNOSIS — M25552 Pain in left hip: Secondary | ICD-10-CM | POA: Diagnosis not present

## 2016-03-23 DIAGNOSIS — M25562 Pain in left knee: Secondary | ICD-10-CM | POA: Diagnosis not present

## 2016-03-25 DIAGNOSIS — M25552 Pain in left hip: Secondary | ICD-10-CM | POA: Diagnosis not present

## 2016-03-25 DIAGNOSIS — M25562 Pain in left knee: Secondary | ICD-10-CM | POA: Diagnosis not present

## 2016-03-30 DIAGNOSIS — M25552 Pain in left hip: Secondary | ICD-10-CM | POA: Diagnosis not present

## 2016-03-30 DIAGNOSIS — M25562 Pain in left knee: Secondary | ICD-10-CM | POA: Diagnosis not present

## 2016-04-01 DIAGNOSIS — M25562 Pain in left knee: Secondary | ICD-10-CM | POA: Diagnosis not present

## 2016-04-01 DIAGNOSIS — M25552 Pain in left hip: Secondary | ICD-10-CM | POA: Diagnosis not present

## 2016-04-06 ENCOUNTER — Ambulatory Visit (INDEPENDENT_AMBULATORY_CARE_PROVIDER_SITE_OTHER): Payer: PPO | Admitting: Neurology

## 2016-04-06 ENCOUNTER — Encounter: Payer: Self-pay | Admitting: Neurology

## 2016-04-06 VITALS — BP 92/68 | HR 66 | Ht 63.5 in | Wt 130.6 lb

## 2016-04-06 DIAGNOSIS — M25562 Pain in left knee: Secondary | ICD-10-CM | POA: Diagnosis not present

## 2016-04-06 DIAGNOSIS — G43109 Migraine with aura, not intractable, without status migrainosus: Secondary | ICD-10-CM

## 2016-04-06 DIAGNOSIS — M25552 Pain in left hip: Secondary | ICD-10-CM | POA: Diagnosis not present
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
## 2016-04-06 NOTE — Patient Instructions (Addendum)
Remember to drink plenty of fluid, eat healthy meals and do not skip any meals. Try to eat protein with a every meal and eat a healthy snack such as fruit or nuts in between meals. Try to keep a regular sleep-wake schedule and try to exercise daily, particularly in the form of walking, 20-30 minutes a day, if you can.   As far as diagnostic testing: Will review all the records and get back to you over the phone  Will review all the records and give you a call back.   Our phone number is [phone]. We also have an after hours call service for urgent matters and there is a physician on-call for urgent questions. For any emergencies you know to call 911 or go to the nearest emergency room

## 2016-04-08 DIAGNOSIS — M25552 Pain in left hip: Secondary | ICD-10-CM | POA: Diagnosis not present

## 2016-04-08 DIAGNOSIS — M25562 Pain in left knee: Secondary | ICD-10-CM | POA: Diagnosis not present

## 2016-04-13 ENCOUNTER — Encounter: Payer: Self-pay | Admitting: Neurology

## 2016-04-13 DIAGNOSIS — G43109 Migraine with aura, not intractable, without status migrainosus: Secondary | ICD-10-CM | POA: Insufficient documentation

## 2016-04-13 DIAGNOSIS — M25552 Pain in left hip: Secondary | ICD-10-CM | POA: Diagnosis not present

## 2016-04-13 DIAGNOSIS — M25562 Pain in left knee: Secondary | ICD-10-CM | POA: Diagnosis not present

## 2016-04-16 DIAGNOSIS — M25552 Pain in left hip: Secondary | ICD-10-CM | POA: Diagnosis not present

## 2016-04-16 DIAGNOSIS — M25562 Pain in left knee: Secondary | ICD-10-CM | POA: Diagnosis not present

## 2016-04-20 DIAGNOSIS — M25552 Pain in left hip: Secondary | ICD-10-CM | POA: Diagnosis not present

## 2016-04-20 DIAGNOSIS — M25562 Pain in left knee: Secondary | ICD-10-CM | POA: Diagnosis not present

## 2016-04-22 DIAGNOSIS — M25552 Pain in left hip: Secondary | ICD-10-CM | POA: Diagnosis not present

## 2016-04-22 DIAGNOSIS — M25562 Pain in left knee: Secondary | ICD-10-CM | POA: Diagnosis not present

## 2016-04-27 DIAGNOSIS — M25552 Pain in left hip: Secondary | ICD-10-CM | POA: Diagnosis not present

## 2016-04-27 DIAGNOSIS — M25562 Pain in left knee: Secondary | ICD-10-CM | POA: Diagnosis not present

## 2016-04-29 DIAGNOSIS — M25562 Pain in left knee: Secondary | ICD-10-CM | POA: Diagnosis not present

## 2016-04-29 DIAGNOSIS — M25552 Pain in left hip: Secondary | ICD-10-CM | POA: Diagnosis not present

## 2016-05-08 DIAGNOSIS — G43801 Other migraine, not intractable, with status migrainosus: Secondary | ICD-10-CM | POA: Diagnosis not present

## 2016-05-08 DIAGNOSIS — G453 Amaurosis fugax: Secondary | ICD-10-CM | POA: Diagnosis not present

## 2016-05-11 ENCOUNTER — Other Ambulatory Visit: Payer: Self-pay | Admitting: Family Medicine

## 2016-05-11 DIAGNOSIS — G453 Amaurosis fugax: Secondary | ICD-10-CM

## 2016-05-13 DIAGNOSIS — M25562 Pain in left knee: Secondary | ICD-10-CM | POA: Diagnosis not present

## 2016-05-13 DIAGNOSIS — M25552 Pain in left hip: Secondary | ICD-10-CM | POA: Diagnosis not present

## 2016-05-18 DIAGNOSIS — M25562 Pain in left knee: Secondary | ICD-10-CM | POA: Diagnosis not present

## 2016-05-18 DIAGNOSIS — M25552 Pain in left hip: Secondary | ICD-10-CM | POA: Diagnosis not present

## 2016-05-19 ENCOUNTER — Other Ambulatory Visit: Payer: PPO

## 2016-05-19 DIAGNOSIS — G453 Amaurosis fugax: Secondary | ICD-10-CM | POA: Diagnosis not present

## 2016-05-19 DIAGNOSIS — G43801 Other migraine, not intractable, with status migrainosus: Secondary | ICD-10-CM | POA: Diagnosis not present

## 2016-05-19 DIAGNOSIS — H40013 Open angle with borderline findings, low risk, bilateral: Secondary | ICD-10-CM | POA: Diagnosis not present

## 2016-05-21 DIAGNOSIS — M25562 Pain in left knee: Secondary | ICD-10-CM | POA: Diagnosis not present

## 2016-05-21 DIAGNOSIS — M25552 Pain in left hip: Secondary | ICD-10-CM | POA: Diagnosis not present

## 2016-05-22 ENCOUNTER — Encounter: Payer: Self-pay | Admitting: Hematology and Oncology

## 2016-05-22 ENCOUNTER — Telehealth: Payer: Self-pay | Admitting: Hematology and Oncology

## 2016-05-22 NOTE — Telephone Encounter (Signed)
Appointment scheduled with Alvy Bimler on 8/16 at 930am. Patient agreed. Letter faxed to the referring and mailed to the patient. Demographics to the referring.

## 2016-05-25 ENCOUNTER — Ambulatory Visit
Admission: RE | Admit: 2016-05-25 | Discharge: 2016-05-25 | Disposition: A | Discharge status: Home / Self Care | Payer: PPO | Source: Ambulatory Visit | Attending: Family Medicine | Admitting: Family Medicine

## 2016-05-25 DIAGNOSIS — I6523 Occlusion and stenosis of bilateral carotid arteries: Secondary | ICD-10-CM | POA: Diagnosis not present

## 2016-05-25 DIAGNOSIS — G453 Amaurosis fugax: Secondary | ICD-10-CM

## 2016-05-26 DIAGNOSIS — M25552 Pain in left hip: Secondary | ICD-10-CM | POA: Diagnosis not present

## 2016-05-26 DIAGNOSIS — M25562 Pain in left knee: Secondary | ICD-10-CM | POA: Diagnosis not present

## 2016-05-27 ENCOUNTER — Ambulatory Visit: Payer: PPO | Admitting: Hematology and Oncology

## 2016-05-28 ENCOUNTER — Ambulatory Visit (HOSPITAL_BASED_OUTPATIENT_CLINIC_OR_DEPARTMENT_OTHER): Payer: PPO | Admitting: Hematology and Oncology

## 2016-05-28 ENCOUNTER — Encounter: Payer: Self-pay | Admitting: Hematology and Oncology

## 2016-05-28 DIAGNOSIS — G453 Amaurosis fugax: Secondary | ICD-10-CM

## 2016-05-28 DIAGNOSIS — D6851 Activated protein C resistance: Secondary | ICD-10-CM | POA: Diagnosis not present

## 2016-05-28 DIAGNOSIS — M25562 Pain in left knee: Secondary | ICD-10-CM | POA: Diagnosis not present

## 2016-05-28 DIAGNOSIS — M25552 Pain in left hip: Secondary | ICD-10-CM | POA: Diagnosis not present

## 2016-05-28 NOTE — Assessment & Plan Note (Signed)
This is likely triggered by recent HRT use She will remain on aspirin indefinitely Sometimes, significant migraine can also cause TIA-like symptoms If aspirin is not enough to protect her, she may need additional Plavix

## 2016-05-28 NOTE — Progress Notes (Signed)
[facility] CONSULT NOTE  Patient Care Team: Cari Caraway, MD as PCP - General (Family Medicine)  CHIEF COMPLAINTS/PURPOSE OF CONSULTATION:  Recurrent TIA/amaurosis fugax of the left eye  HISTORY OF PRESENTING ILLNESS:  Regina [name] 75 y.o. female is here because of recent symptoms of TIA. This patient experienced transient left eye visual loss lasting approximately 5 minutes, 2 separate episodes a month apart recently. The patient had been taking estrogen replacement therapy for many years for osteoporosis. She subsequently went to see her ophthalmologist and her primary care doctor had ordered further evaluation. She tested positive for factor V Leiden mutation and hence was referred here. She denies permanent neurological deficits. Interim of risk factors, she has no history of diabetes, hypertension, prior stroke or atrial fibrillation She denies lower extremity swelling, warmth, tenderness & erythema.  She denies recent chest pain on exertion, shortness of breath on minimal exertion, pre-syncopal episodes, hemoptysis, or palpitation. She denies recent history of trauma, long distance travel, dehydration, recent surgery, smoking or prolonged immobilization. She had no prior history or diagnosis of cancer. Her age appropriate screening programs are up-to-date. She had prior surgeries before and never had perioperative thromboembolic events. There is no family history of blood clots or miscarriages. She has no children   MEDICAL HISTORY:  Past Medical History:  Diagnosis Date  . Headache   . Nervous system disorder    spinal cord tethered  . Osteoporosis     SURGICAL HISTORY: Past Surgical History:  Procedure Laterality Date  . PARTIAL HYSTERECTOMY  1996  . TUBAL LIGATION      SOCIAL HISTORY: Social History   Social History  . Marital status: Married    Spouse name: [name]  . Number of children: 0  . Years of education: Some colle   Occupational  History  . Self employed    Social History Main Topics  . Smoking status: Former Smoker    Packs/day: 2.00    Years: 4.00    Types: Cigarettes    Quit date: 12/10/1972  . Smokeless tobacco: Never Used  . Alcohol use 0.0 oz/week     Comment: 2-3 glasses wine/week  . Drug use: No  . Sexual activity: Not on file   Other Topics Concern  . Not on file   Social History Narrative   Lives w/ husband    Caffeine use: 2-3 cups coffee per day    FAMILY HISTORY: Family History  Problem Relation Age of Onset  . Asthma Mother   . COPD Mother   . Stroke Mother     TIA  . Heart attack Maternal Grandmother   . Heart attack Maternal Grandfather   . Cancer Paternal Grandmother   . Migraines Neg Hx     ALLERGIES:  is allergic to ketoprofen and morphine and related.  MEDICATIONS:  Current Outpatient Prescriptions  Medication Sig Dispense Refill  . acetaminophen-codeine (TYLENOL #3) 300-30 MG tablet Take 1 tablet by mouth every 6 (six) hours as needed.    . Ascorbic Acid (VITAMIN C) 1000 MG tablet Take 1,000 mg by mouth.    Marland Kitchen aspirin EC 81 MG tablet Take 81 mg by mouth.    . B Complex Vitamins (VITAMIN-B COMPLEX) TABS Take 1 tablet by mouth daily.    . calcium citrate-vitamin D (CITRACAL+D) 315-200 MG-UNIT tablet Take 1 tablet by mouth every morning.    . Cholecalciferol (VITAMIN D3) 2000 units TABS Take 2,000 Units by mouth daily.    . CHROMIUM PO  Take 200 mg by mouth daily.    Marland Kitchen LORazepam (ATIVAN) 0.5 MG tablet Take 1 tablet by mouth daily as needed.     . Lutein 6 MG CAPS Take 1 capsule by mouth daily.    Marland Kitchen Lysine ([employer] LYSINE) 1000 MG TABS Take 1 tablet by mouth daily.    . Multiple Vitamins-Minerals (MACUVITE EYE CARE PO) Take 1 capsule by mouth daily.    . Multiple Vitamins-Minerals (WOMENS BONE HEALTH PO) Take 1 tablet by mouth daily.    Marland Kitchen omeprazole (PRILOSEC) 40 MG capsule 1 capsule 2 (two) times daily.    Marland Kitchen UNABLE TO FIND Inhale 1 puff into the lungs 3 (three) times daily as  needed. Med Name: Wyvonna Plum Inhaler     No current facility-administered medications for this visit.     REVIEW OF SYSTEMS:   Constitutional: Denies fevers, chills or abnormal night sweats Eyes: Denies blurriness of vision, double vision or watery eyes Ears, nose, mouth, throat, and face: Denies mucositis or sore throat Respiratory: Denies cough, dyspnea or wheezes Cardiovascular: Denies palpitation, chest discomfort or lower extremity swelling Gastrointestinal:  Denies nausea, heartburn or change in bowel habits Skin: Denies abnormal skin rashes Lymphatics: Denies new lymphadenopathy or easy bruising Neurological:Denies numbness, tingling or new weaknesses Behavioral/Psych: Mood is stable, no new changes  All other systems were reviewed with the patient and are negative.  PHYSICAL EXAMINATION: ECOG PERFORMANCE STATUS: 0 - Asymptomatic  Vitals:   05/28/16 0836  BP: 126/78  Pulse: (!) 58  Resp: 18  Temp: 97.7 F (36.5 C)   Filed Weights   05/28/16 0836  Weight: 132 lb (59.9 kg)    GENERAL:alert, no distress and comfortable SKIN: skin color, texture, turgor are normal, no rashes or significant lesions EYES: normal, conjunctiva are pink and non-injected, sclera clear OROPHARYNX:no exudate, no erythema and lips, buccal mucosa, and tongue normal  NECK: supple, thyroid normal size, non-tender, without nodularity LYMPH:  no palpable lymphadenopathy in the cervical, axillary or inguinal LUNGS: clear to auscultation and percussion with normal breathing effort HEART: regular rate & rhythm and no murmurs and no lower extremity edema ABDOMEN:abdomen soft, non-tender and normal bowel sounds Musculoskeletal:no cyanosis of digits and no clubbing  PSYCH: alert & oriented x 3 with fluent speech NEURO: no focal motor/sensory deficits  LABORATORY DATA:  I have reviewed the data as listed  RADIOGRAPHIC STUDIES:I reviewed her old MRI from 3 years ago I have personally reviewed the  radiological images as listed and agreed with the findings in the report. US Carotid Bilateral  Result Date: 05/25/2016 CLINICAL DATA:  75 year old female with 2 episodes of vision loss in the left eye and dizziness EXAM: BILATERAL CAROTID DUPLEX ULTRASOUND TECHNIQUE: Pearline Cables scale imaging, color Doppler and duplex ultrasound were performed of bilateral carotid and vertebral arteries in the neck. COMPARISON:  None. FINDINGS: Criteria: Quantification of carotid stenosis is based on velocity parameters that correlate the residual internal carotid diameter with NASCET-based stenosis levels, using the diameter of the distal internal carotid lumen as the denominator for stenosis measurement. The following velocity measurements were obtained: RIGHT ICA:  85/30 cm/sec CCA:  [id number] cm/sec SYSTOLIC ICA/CCA RATIO:  1.0 DIASTOLIC ICA/CCA RATIO:  1.9 ECA:  58 cm/sec LEFT ICA:  76/22 cm/sec CCA:  [id number] cm/sec SYSTOLIC ICA/CCA RATIO:  0.8 DIASTOLIC ICA/CCA RATIO:  1.1 ECA:  50 for cm/sec RIGHT CAROTID ARTERY: Trace heterogeneous atherosclerotic plaque in the proximal internal carotid artery. By peak systolic velocity criteria the estimated stenosis remains less than 50%.  RIGHT VERTEBRAL ARTERY:  Patent with normal antegrade flow. LEFT CAROTID ARTERY: Trace heterogeneous plaque in the proximal internal carotid artery. By peak systolic velocity criteria the estimated stenosis remains less than 50%. LEFT VERTEBRAL ARTERY:  Patent with normal antegrade flow. Other: Incidentally detected solid isoechoic nodule in the right thyroid gland measures up to 1.1 cm. No specific imaging follow-up required for incidentally detected thyroid nodule smaller than 1.5 cm. IMPRESSION: 1. Mild (1-49%) stenosis proximal right internal carotid artery secondary to mild focal heterogeneous atherosclerotic plaque. 2. Mild (1-49%) stenosis proximal left internal carotid artery secondary to trace focal heterogeneous atherosclerotic plaque. 3. Vertebral  arteries are patent with normal antegrade flow. Signed, Criselda Peaches, MD [facility] Electronically Signed   By: Jacqulynn Cadet M.D.   On: 05/25/2016 16:58    ASSESSMENT & PLAN: Heterozygous factor V Leiden mutation ([place]) I reviewed with the patient about the plan for care for recent TIA It is very likely that recent TIA was predisposed by hormone replacement therapy. I do not believe the factor V Leiden mutation is the major cause of her recent TIA. The patient have discontinued HRT and is on aspirin. She is doing well. I recommend she continue 81 mg aspirin indefinitely  We discussed the implication of factor V Leiden mutation. Thrombophilia disorder is a genetic predisposition which increases an individual's risk for a thrombotic event, NOT a disease. Since the patient has no children, there is no need to worry about testing other family members.  Finally, at the end of our consultation today, I reinforced the importance of preventive strategies such as avoiding hormonal supplement, avoiding cigarette smoking, keeping up-to-date with screening programs for early cancer detection, frequent ambulation for long distance travel and aggressive DVT prophylaxis in all surgical settings.    Amaurosis fugax of left eye This is likely triggered by recent HRT use She will remain on aspirin indefinitely Sometimes, significant migraine can also cause TIA-like symptoms If aspirin is not enough to protect her, she may need additional Plavix    I have not made a return appointment for the patient to come back. All questions were answered. The patient knows to call the clinic with any problems, questions or concerns. I spent 40 minutes counseling the patient face to face. The total time spent in the appointment was 55 minutes and more than 50% was on counseling.     Sufyaan Palma, MD 05/28/2016 10:24 AM

## 2016-05-28 NOTE — Assessment & Plan Note (Signed)
I reviewed with the patient about the plan for care for recent TIA It is very likely that recent TIA was predisposed by hormone replacement therapy. I do not believe the factor V Leiden mutation is the major cause of her recent TIA. The patient have discontinued HRT and is on aspirin. She is doing well. I recommend she continue 81 mg aspirin indefinitely  We discussed the implication of factor V Leiden mutation. Thrombophilia disorder is a genetic predisposition which increases an individual's risk for a thrombotic event, NOT a disease. Since the patient has no children, there is no need to worry about testing other family members.  Finally, at the end of our consultation today, I reinforced the importance of preventive strategies such as avoiding hormonal supplement, avoiding cigarette smoking, keeping up-to-date with screening programs for early cancer detection, frequent ambulation for long distance travel and aggressive DVT prophylaxis in all surgical settings.

## 2016-06-05 DIAGNOSIS — M25552 Pain in left hip: Secondary | ICD-10-CM | POA: Diagnosis not present

## 2016-06-05 DIAGNOSIS — M25562 Pain in left knee: Secondary | ICD-10-CM | POA: Diagnosis not present

## 2016-06-11 DIAGNOSIS — M25562 Pain in left knee: Secondary | ICD-10-CM | POA: Diagnosis not present

## 2016-06-11 DIAGNOSIS — M25552 Pain in left hip: Secondary | ICD-10-CM | POA: Diagnosis not present

## 2016-06-17 DIAGNOSIS — M25552 Pain in left hip: Secondary | ICD-10-CM | POA: Diagnosis not present

## 2016-06-17 DIAGNOSIS — M25562 Pain in left knee: Secondary | ICD-10-CM | POA: Diagnosis not present

## 2016-06-24 DIAGNOSIS — M25562 Pain in left knee: Secondary | ICD-10-CM | POA: Diagnosis not present

## 2016-06-24 DIAGNOSIS — M25552 Pain in left hip: Secondary | ICD-10-CM | POA: Diagnosis not present

## 2016-07-03 DIAGNOSIS — M25552 Pain in left hip: Secondary | ICD-10-CM | POA: Diagnosis not present

## 2016-07-03 DIAGNOSIS — M25562 Pain in left knee: Secondary | ICD-10-CM | POA: Diagnosis not present

## 2016-08-26 DIAGNOSIS — L820 Inflamed seborrheic keratosis: Secondary | ICD-10-CM | POA: Diagnosis not present

## 2016-08-26 DIAGNOSIS — D225 Melanocytic nevi of trunk: Secondary | ICD-10-CM | POA: Diagnosis not present

## 2016-08-26 DIAGNOSIS — L918 Other hypertrophic disorders of the skin: Secondary | ICD-10-CM | POA: Diagnosis not present

## 2016-08-26 DIAGNOSIS — D2372 Other benign neoplasm of skin of left lower limb, including hip: Secondary | ICD-10-CM | POA: Diagnosis not present

## 2016-08-26 DIAGNOSIS — D2271 Melanocytic nevi of right lower limb, including hip: Secondary | ICD-10-CM | POA: Diagnosis not present

## 2016-08-26 DIAGNOSIS — D1801 Hemangioma of skin and subcutaneous tissue: Secondary | ICD-10-CM | POA: Diagnosis not present

## 2016-08-26 DIAGNOSIS — L821 Other seborrheic keratosis: Secondary | ICD-10-CM | POA: Diagnosis not present

## 2016-08-26 DIAGNOSIS — C44519 Basal cell carcinoma of skin of other part of trunk: Secondary | ICD-10-CM | POA: Diagnosis not present

## 2016-08-26 DIAGNOSIS — L812 Freckles: Secondary | ICD-10-CM | POA: Diagnosis not present

## 2016-10-06 DIAGNOSIS — Z83511 Family history of glaucoma: Secondary | ICD-10-CM | POA: Diagnosis not present

## 2016-10-06 DIAGNOSIS — H2513 Age-related nuclear cataract, bilateral: Secondary | ICD-10-CM | POA: Diagnosis not present

## 2016-10-06 DIAGNOSIS — H40013 Open angle with borderline findings, low risk, bilateral: Secondary | ICD-10-CM | POA: Diagnosis not present

## 2016-10-06 DIAGNOSIS — H16223 Keratoconjunctivitis sicca, not specified as Sjogren's, bilateral: Secondary | ICD-10-CM | POA: Diagnosis not present

## 2016-11-11 DIAGNOSIS — H43812 Vitreous degeneration, left eye: Secondary | ICD-10-CM | POA: Diagnosis not present

## 2016-11-11 DIAGNOSIS — H43392 Other vitreous opacities, left eye: Secondary | ICD-10-CM | POA: Diagnosis not present

## 2016-12-02 DIAGNOSIS — H43392 Other vitreous opacities, left eye: Secondary | ICD-10-CM | POA: Diagnosis not present

## 2016-12-02 DIAGNOSIS — H43812 Vitreous degeneration, left eye: Secondary | ICD-10-CM | POA: Diagnosis not present

## 2017-01-28 DIAGNOSIS — Z0000 Encounter for general adult medical examination without abnormal findings: Secondary | ICD-10-CM | POA: Diagnosis not present

## 2017-01-28 DIAGNOSIS — G453 Amaurosis fugax: Secondary | ICD-10-CM | POA: Diagnosis not present

## 2017-01-28 DIAGNOSIS — Z7189 Other specified counseling: Secondary | ICD-10-CM | POA: Diagnosis not present

## 2017-01-28 DIAGNOSIS — R8290 Unspecified abnormal findings in urine: Secondary | ICD-10-CM | POA: Diagnosis not present

## 2017-01-28 DIAGNOSIS — D6851 Activated protein C resistance: Secondary | ICD-10-CM | POA: Diagnosis not present

## 2017-01-28 DIAGNOSIS — M810 Age-related osteoporosis without current pathological fracture: Secondary | ICD-10-CM | POA: Diagnosis not present

## 2017-01-28 DIAGNOSIS — N319 Neuromuscular dysfunction of bladder, unspecified: Secondary | ICD-10-CM | POA: Diagnosis not present

## 2017-01-28 DIAGNOSIS — F419 Anxiety disorder, unspecified: Secondary | ICD-10-CM | POA: Diagnosis not present

## 2017-01-28 DIAGNOSIS — Z131 Encounter for screening for diabetes mellitus: Secondary | ICD-10-CM | POA: Diagnosis not present

## 2017-01-28 DIAGNOSIS — Z01419 Encounter for gynecological examination (general) (routine) without abnormal findings: Secondary | ICD-10-CM | POA: Diagnosis not present

## 2017-01-28 DIAGNOSIS — Z1389 Encounter for screening for other disorder: Secondary | ICD-10-CM | POA: Diagnosis not present

## 2017-01-28 DIAGNOSIS — K30 Functional dyspepsia: Secondary | ICD-10-CM | POA: Diagnosis not present

## 2017-02-17 DIAGNOSIS — H16223 Keratoconjunctivitis sicca, not specified as Sjogren's, bilateral: Secondary | ICD-10-CM | POA: Diagnosis not present

## 2017-02-17 DIAGNOSIS — H40013 Open angle with borderline findings, low risk, bilateral: Secondary | ICD-10-CM | POA: Diagnosis not present

## 2017-02-17 DIAGNOSIS — H2513 Age-related nuclear cataract, bilateral: Secondary | ICD-10-CM | POA: Diagnosis not present

## 2017-06-24 DIAGNOSIS — N1330 Unspecified hydronephrosis: Secondary | ICD-10-CM | POA: Diagnosis not present

## 2017-06-24 DIAGNOSIS — N319 Neuromuscular dysfunction of bladder, unspecified: Secondary | ICD-10-CM | POA: Diagnosis not present

## 2017-06-28 DIAGNOSIS — Z1231 Encounter for screening mammogram for malignant neoplasm of breast: Secondary | ICD-10-CM | POA: Diagnosis not present

## 2017-08-10 DIAGNOSIS — M25552 Pain in left hip: Secondary | ICD-10-CM | POA: Diagnosis not present

## 2017-08-10 DIAGNOSIS — R8290 Unspecified abnormal findings in urine: Secondary | ICD-10-CM | POA: Diagnosis not present

## 2017-08-10 DIAGNOSIS — Q069 Congenital malformation of spinal cord, unspecified: Secondary | ICD-10-CM | POA: Diagnosis not present

## 2017-08-17 DIAGNOSIS — H43392 Other vitreous opacities, left eye: Secondary | ICD-10-CM | POA: Diagnosis not present

## 2017-08-17 DIAGNOSIS — H40013 Open angle with borderline findings, low risk, bilateral: Secondary | ICD-10-CM | POA: Diagnosis not present

## 2017-08-17 DIAGNOSIS — H2513 Age-related nuclear cataract, bilateral: Secondary | ICD-10-CM | POA: Diagnosis not present

## 2017-08-17 DIAGNOSIS — H43812 Vitreous degeneration, left eye: Secondary | ICD-10-CM | POA: Diagnosis not present

## 2017-10-19 DIAGNOSIS — D224 Melanocytic nevi of scalp and neck: Secondary | ICD-10-CM | POA: Diagnosis not present

## 2017-10-19 DIAGNOSIS — L821 Other seborrheic keratosis: Secondary | ICD-10-CM | POA: Diagnosis not present

## 2017-10-19 DIAGNOSIS — D692 Other nonthrombocytopenic purpura: Secondary | ICD-10-CM | POA: Diagnosis not present

## 2017-10-19 DIAGNOSIS — D2261 Melanocytic nevi of right upper limb, including shoulder: Secondary | ICD-10-CM | POA: Diagnosis not present

## 2017-10-19 DIAGNOSIS — L814 Other melanin hyperpigmentation: Secondary | ICD-10-CM | POA: Diagnosis not present

## 2017-10-19 DIAGNOSIS — L603 Nail dystrophy: Secondary | ICD-10-CM | POA: Diagnosis not present

## 2017-10-19 DIAGNOSIS — Z85828 Personal history of other malignant neoplasm of skin: Secondary | ICD-10-CM | POA: Diagnosis not present

## 2017-10-19 DIAGNOSIS — D1801 Hemangioma of skin and subcutaneous tissue: Secondary | ICD-10-CM | POA: Diagnosis not present

## 2017-10-19 DIAGNOSIS — D225 Melanocytic nevi of trunk: Secondary | ICD-10-CM | POA: Diagnosis not present

## 2017-10-19 DIAGNOSIS — L7211 Pilar cyst: Secondary | ICD-10-CM | POA: Diagnosis not present

## 2017-12-16 DIAGNOSIS — J029 Acute pharyngitis, unspecified: Secondary | ICD-10-CM | POA: Diagnosis not present

## 2017-12-28 DIAGNOSIS — J029 Acute pharyngitis, unspecified: Secondary | ICD-10-CM | POA: Diagnosis not present

## 2017-12-28 DIAGNOSIS — R490 Dysphonia: Secondary | ICD-10-CM | POA: Diagnosis not present

## 2017-12-28 DIAGNOSIS — R05 Cough: Secondary | ICD-10-CM | POA: Diagnosis not present

## 2018-01-03 DIAGNOSIS — J343 Hypertrophy of nasal turbinates: Secondary | ICD-10-CM | POA: Diagnosis not present

## 2018-01-03 DIAGNOSIS — R05 Cough: Secondary | ICD-10-CM | POA: Diagnosis not present

## 2018-01-03 DIAGNOSIS — R490 Dysphonia: Secondary | ICD-10-CM | POA: Diagnosis not present

## 2018-01-03 DIAGNOSIS — R0982 Postnasal drip: Secondary | ICD-10-CM | POA: Insufficient documentation

## 2018-01-05 DIAGNOSIS — J329 Chronic sinusitis, unspecified: Secondary | ICD-10-CM | POA: Diagnosis not present

## 2018-01-05 DIAGNOSIS — R05 Cough: Secondary | ICD-10-CM | POA: Diagnosis not present

## 2018-01-05 DIAGNOSIS — R0982 Postnasal drip: Secondary | ICD-10-CM | POA: Diagnosis not present

## 2018-01-20 DIAGNOSIS — R05 Cough: Secondary | ICD-10-CM | POA: Diagnosis not present

## 2018-01-20 DIAGNOSIS — J302 Other seasonal allergic rhinitis: Secondary | ICD-10-CM | POA: Diagnosis not present

## 2018-01-31 DIAGNOSIS — Z131 Encounter for screening for diabetes mellitus: Secondary | ICD-10-CM | POA: Diagnosis not present

## 2018-01-31 DIAGNOSIS — R05 Cough: Secondary | ICD-10-CM | POA: Diagnosis not present

## 2018-01-31 DIAGNOSIS — H9193 Unspecified hearing loss, bilateral: Secondary | ICD-10-CM | POA: Diagnosis not present

## 2018-01-31 DIAGNOSIS — B351 Tinea unguium: Secondary | ICD-10-CM | POA: Diagnosis not present

## 2018-01-31 DIAGNOSIS — Z1389 Encounter for screening for other disorder: Secondary | ICD-10-CM | POA: Diagnosis not present

## 2018-01-31 DIAGNOSIS — Z23 Encounter for immunization: Secondary | ICD-10-CM | POA: Diagnosis not present

## 2018-01-31 DIAGNOSIS — R011 Cardiac murmur, unspecified: Secondary | ICD-10-CM | POA: Diagnosis not present

## 2018-01-31 DIAGNOSIS — Z79899 Other long term (current) drug therapy: Secondary | ICD-10-CM | POA: Diagnosis not present

## 2018-01-31 DIAGNOSIS — Z0000 Encounter for general adult medical examination without abnormal findings: Secondary | ICD-10-CM | POA: Diagnosis not present

## 2018-01-31 DIAGNOSIS — Z86010 Personal history of colonic polyps: Secondary | ICD-10-CM | POA: Diagnosis not present

## 2018-01-31 DIAGNOSIS — M810 Age-related osteoporosis without current pathological fracture: Secondary | ICD-10-CM | POA: Diagnosis not present

## 2018-01-31 DIAGNOSIS — D6851 Activated protein C resistance: Secondary | ICD-10-CM | POA: Diagnosis not present

## 2018-02-03 DIAGNOSIS — M810 Age-related osteoporosis without current pathological fracture: Secondary | ICD-10-CM | POA: Diagnosis not present

## 2018-02-03 DIAGNOSIS — Z79899 Other long term (current) drug therapy: Secondary | ICD-10-CM | POA: Diagnosis not present

## 2018-02-03 DIAGNOSIS — Z131 Encounter for screening for diabetes mellitus: Secondary | ICD-10-CM | POA: Diagnosis not present

## 2018-02-08 DIAGNOSIS — M8588 Other specified disorders of bone density and structure, other site: Secondary | ICD-10-CM | POA: Diagnosis not present

## 2018-02-08 DIAGNOSIS — M810 Age-related osteoporosis without current pathological fracture: Secondary | ICD-10-CM | POA: Diagnosis not present

## 2018-03-02 DIAGNOSIS — H35363 Drusen (degenerative) of macula, bilateral: Secondary | ICD-10-CM | POA: Diagnosis not present

## 2018-03-02 DIAGNOSIS — H40013 Open angle with borderline findings, low risk, bilateral: Secondary | ICD-10-CM | POA: Diagnosis not present

## 2018-03-02 DIAGNOSIS — H04123 Dry eye syndrome of bilateral lacrimal glands: Secondary | ICD-10-CM | POA: Diagnosis not present

## 2018-03-02 DIAGNOSIS — H2513 Age-related nuclear cataract, bilateral: Secondary | ICD-10-CM | POA: Diagnosis not present

## 2018-03-29 DIAGNOSIS — S8000XA Contusion of unspecified knee, initial encounter: Secondary | ICD-10-CM | POA: Diagnosis not present

## 2018-03-29 DIAGNOSIS — M25562 Pain in left knee: Secondary | ICD-10-CM | POA: Diagnosis not present

## 2018-04-21 DIAGNOSIS — H259 Unspecified age-related cataract: Secondary | ICD-10-CM | POA: Diagnosis not present

## 2018-04-21 DIAGNOSIS — S8002XD Contusion of left knee, subsequent encounter: Secondary | ICD-10-CM | POA: Diagnosis not present

## 2018-05-03 DIAGNOSIS — Z86010 Personal history of colonic polyps: Secondary | ICD-10-CM | POA: Diagnosis not present

## 2018-05-03 DIAGNOSIS — K219 Gastro-esophageal reflux disease without esophagitis: Secondary | ICD-10-CM | POA: Diagnosis not present

## 2018-05-26 DIAGNOSIS — Z86010 Personal history of colonic polyps: Secondary | ICD-10-CM | POA: Diagnosis not present

## 2018-05-26 DIAGNOSIS — K219 Gastro-esophageal reflux disease without esophagitis: Secondary | ICD-10-CM | POA: Diagnosis not present

## 2018-05-26 DIAGNOSIS — D126 Benign neoplasm of colon, unspecified: Secondary | ICD-10-CM | POA: Diagnosis not present

## 2018-05-26 DIAGNOSIS — R05 Cough: Secondary | ICD-10-CM | POA: Diagnosis not present

## 2018-05-31 DIAGNOSIS — D126 Benign neoplasm of colon, unspecified: Secondary | ICD-10-CM | POA: Diagnosis not present

## 2018-07-07 DIAGNOSIS — N319 Neuromuscular dysfunction of bladder, unspecified: Secondary | ICD-10-CM | POA: Diagnosis not present

## 2018-07-07 DIAGNOSIS — N3946 Mixed incontinence: Secondary | ICD-10-CM | POA: Diagnosis not present

## 2018-07-07 DIAGNOSIS — N281 Cyst of kidney, acquired: Secondary | ICD-10-CM | POA: Diagnosis not present

## 2018-07-19 DIAGNOSIS — N281 Cyst of kidney, acquired: Secondary | ICD-10-CM | POA: Diagnosis not present

## 2018-07-19 DIAGNOSIS — C642 Malignant neoplasm of left kidney, except renal pelvis: Secondary | ICD-10-CM | POA: Diagnosis not present

## 2018-07-21 DIAGNOSIS — N319 Neuromuscular dysfunction of bladder, unspecified: Secondary | ICD-10-CM | POA: Diagnosis not present

## 2018-07-21 DIAGNOSIS — N281 Cyst of kidney, acquired: Secondary | ICD-10-CM | POA: Diagnosis not present

## 2018-08-11 DIAGNOSIS — Z7184 Encounter for health counseling related to travel: Secondary | ICD-10-CM | POA: Diagnosis not present

## 2018-08-11 DIAGNOSIS — F419 Anxiety disorder, unspecified: Secondary | ICD-10-CM | POA: Diagnosis not present

## 2018-08-11 DIAGNOSIS — G250 Essential tremor: Secondary | ICD-10-CM | POA: Diagnosis not present

## 2018-08-17 DIAGNOSIS — H40013 Open angle with borderline findings, low risk, bilateral: Secondary | ICD-10-CM | POA: Diagnosis not present

## 2018-10-19 DIAGNOSIS — D2271 Melanocytic nevi of right lower limb, including hip: Secondary | ICD-10-CM | POA: Diagnosis not present

## 2018-10-19 DIAGNOSIS — Z85828 Personal history of other malignant neoplasm of skin: Secondary | ICD-10-CM | POA: Diagnosis not present

## 2018-10-19 DIAGNOSIS — L570 Actinic keratosis: Secondary | ICD-10-CM | POA: Diagnosis not present

## 2018-10-19 DIAGNOSIS — L218 Other seborrheic dermatitis: Secondary | ICD-10-CM | POA: Diagnosis not present

## 2018-10-19 DIAGNOSIS — D2272 Melanocytic nevi of left lower limb, including hip: Secondary | ICD-10-CM | POA: Diagnosis not present

## 2018-10-19 DIAGNOSIS — D485 Neoplasm of uncertain behavior of skin: Secondary | ICD-10-CM | POA: Diagnosis not present

## 2018-10-19 DIAGNOSIS — D225 Melanocytic nevi of trunk: Secondary | ICD-10-CM | POA: Diagnosis not present

## 2018-10-19 DIAGNOSIS — L821 Other seborrheic keratosis: Secondary | ICD-10-CM | POA: Diagnosis not present

## 2018-10-19 DIAGNOSIS — C44319 Basal cell carcinoma of skin of other parts of face: Secondary | ICD-10-CM | POA: Diagnosis not present

## 2018-10-19 DIAGNOSIS — L603 Nail dystrophy: Secondary | ICD-10-CM | POA: Diagnosis not present

## 2018-10-19 DIAGNOSIS — D1801 Hemangioma of skin and subcutaneous tissue: Secondary | ICD-10-CM | POA: Diagnosis not present

## 2018-11-17 DIAGNOSIS — Z85828 Personal history of other malignant neoplasm of skin: Secondary | ICD-10-CM | POA: Diagnosis not present

## 2018-11-17 DIAGNOSIS — C44319 Basal cell carcinoma of skin of other parts of face: Secondary | ICD-10-CM | POA: Diagnosis not present

## 2019-02-21 DIAGNOSIS — M810 Age-related osteoporosis without current pathological fracture: Secondary | ICD-10-CM | POA: Diagnosis not present

## 2019-02-21 DIAGNOSIS — Z131 Encounter for screening for diabetes mellitus: Secondary | ICD-10-CM | POA: Diagnosis not present

## 2019-02-21 DIAGNOSIS — Z79899 Other long term (current) drug therapy: Secondary | ICD-10-CM | POA: Diagnosis not present

## 2019-02-23 DIAGNOSIS — N319 Neuromuscular dysfunction of bladder, unspecified: Secondary | ICD-10-CM | POA: Diagnosis not present

## 2019-02-23 DIAGNOSIS — K219 Gastro-esophageal reflux disease without esophagitis: Secondary | ICD-10-CM | POA: Diagnosis not present

## 2019-02-23 DIAGNOSIS — D6851 Activated protein C resistance: Secondary | ICD-10-CM | POA: Diagnosis not present

## 2019-02-23 DIAGNOSIS — M810 Age-related osteoporosis without current pathological fracture: Secondary | ICD-10-CM | POA: Diagnosis not present

## 2019-02-23 DIAGNOSIS — Q069 Congenital malformation of spinal cord, unspecified: Secondary | ICD-10-CM | POA: Diagnosis not present

## 2019-02-23 DIAGNOSIS — G250 Essential tremor: Secondary | ICD-10-CM | POA: Diagnosis not present

## 2019-02-23 DIAGNOSIS — Z0000 Encounter for general adult medical examination without abnormal findings: Secondary | ICD-10-CM | POA: Diagnosis not present

## 2019-02-23 DIAGNOSIS — R05 Cough: Secondary | ICD-10-CM | POA: Diagnosis not present

## 2019-02-23 DIAGNOSIS — F419 Anxiety disorder, unspecified: Secondary | ICD-10-CM | POA: Diagnosis not present

## 2019-02-23 DIAGNOSIS — G43109 Migraine with aura, not intractable, without status migrainosus: Secondary | ICD-10-CM | POA: Diagnosis not present

## 2019-02-23 DIAGNOSIS — R748 Abnormal levels of other serum enzymes: Secondary | ICD-10-CM | POA: Diagnosis not present

## 2019-02-23 DIAGNOSIS — Z1389 Encounter for screening for other disorder: Secondary | ICD-10-CM | POA: Diagnosis not present

## 2019-03-03 DIAGNOSIS — Z23 Encounter for immunization: Secondary | ICD-10-CM | POA: Diagnosis not present

## 2019-03-29 DIAGNOSIS — H02886 Meibomian gland dysfunction of left eye, unspecified eyelid: Secondary | ICD-10-CM | POA: Insufficient documentation

## 2019-03-29 DIAGNOSIS — H43393 Other vitreous opacities, bilateral: Secondary | ICD-10-CM | POA: Diagnosis not present

## 2019-03-29 DIAGNOSIS — H524 Presbyopia: Secondary | ICD-10-CM | POA: Diagnosis not present

## 2019-03-29 DIAGNOSIS — Z83511 Family history of glaucoma: Secondary | ICD-10-CM | POA: Insufficient documentation

## 2019-03-29 DIAGNOSIS — H40013 Open angle with borderline findings, low risk, bilateral: Secondary | ICD-10-CM | POA: Diagnosis not present

## 2019-03-29 DIAGNOSIS — H0288A Meibomian gland dysfunction right eye, upper and lower eyelids: Secondary | ICD-10-CM | POA: Diagnosis not present

## 2019-03-29 DIAGNOSIS — H0288B Meibomian gland dysfunction left eye, upper and lower eyelids: Secondary | ICD-10-CM | POA: Diagnosis not present

## 2019-03-29 DIAGNOSIS — H25043 Posterior subcapsular polar age-related cataract, bilateral: Secondary | ICD-10-CM | POA: Diagnosis not present

## 2019-03-29 DIAGNOSIS — H52203 Unspecified astigmatism, bilateral: Secondary | ICD-10-CM | POA: Diagnosis not present

## 2019-03-29 DIAGNOSIS — H02883 Meibomian gland dysfunction of right eye, unspecified eyelid: Secondary | ICD-10-CM | POA: Insufficient documentation

## 2019-03-29 DIAGNOSIS — H2512 Age-related nuclear cataract, left eye: Secondary | ICD-10-CM | POA: Diagnosis not present

## 2019-03-29 DIAGNOSIS — H2513 Age-related nuclear cataract, bilateral: Secondary | ICD-10-CM | POA: Diagnosis not present

## 2019-03-29 DIAGNOSIS — H18413 Arcus senilis, bilateral: Secondary | ICD-10-CM | POA: Diagnosis not present

## 2019-03-29 DIAGNOSIS — H25042 Posterior subcapsular polar age-related cataract, left eye: Secondary | ICD-10-CM | POA: Diagnosis not present

## 2019-04-06 DIAGNOSIS — Z1231 Encounter for screening mammogram for malignant neoplasm of breast: Secondary | ICD-10-CM | POA: Diagnosis not present

## 2019-04-07 DIAGNOSIS — H2513 Age-related nuclear cataract, bilateral: Secondary | ICD-10-CM | POA: Diagnosis not present

## 2019-04-07 DIAGNOSIS — Z1159 Encounter for screening for other viral diseases: Secondary | ICD-10-CM | POA: Diagnosis not present

## 2019-04-07 DIAGNOSIS — Z01812 Encounter for preprocedural laboratory examination: Secondary | ICD-10-CM | POA: Diagnosis not present

## 2019-04-13 DIAGNOSIS — H2512 Age-related nuclear cataract, left eye: Secondary | ICD-10-CM | POA: Diagnosis not present

## 2019-04-13 DIAGNOSIS — H25042 Posterior subcapsular polar age-related cataract, left eye: Secondary | ICD-10-CM | POA: Diagnosis not present

## 2019-04-13 DIAGNOSIS — H25812 Combined forms of age-related cataract, left eye: Secondary | ICD-10-CM | POA: Diagnosis not present

## 2019-05-19 DIAGNOSIS — H2511 Age-related nuclear cataract, right eye: Secondary | ICD-10-CM | POA: Diagnosis not present

## 2019-07-07 DIAGNOSIS — Z20828 Contact with and (suspected) exposure to other viral communicable diseases: Secondary | ICD-10-CM | POA: Diagnosis not present

## 2019-07-07 DIAGNOSIS — H2511 Age-related nuclear cataract, right eye: Secondary | ICD-10-CM | POA: Diagnosis not present

## 2019-07-07 DIAGNOSIS — H25811 Combined forms of age-related cataract, right eye: Secondary | ICD-10-CM | POA: Diagnosis not present

## 2019-07-07 DIAGNOSIS — Z01812 Encounter for preprocedural laboratory examination: Secondary | ICD-10-CM | POA: Diagnosis not present

## 2019-07-13 DIAGNOSIS — H2511 Age-related nuclear cataract, right eye: Secondary | ICD-10-CM | POA: Diagnosis not present

## 2019-07-13 DIAGNOSIS — H25811 Combined forms of age-related cataract, right eye: Secondary | ICD-10-CM | POA: Diagnosis not present

## 2019-07-13 DIAGNOSIS — H25041 Posterior subcapsular polar age-related cataract, right eye: Secondary | ICD-10-CM | POA: Diagnosis not present

## 2019-08-16 DIAGNOSIS — H52203 Unspecified astigmatism, bilateral: Secondary | ICD-10-CM | POA: Diagnosis not present

## 2019-08-16 DIAGNOSIS — H524 Presbyopia: Secondary | ICD-10-CM | POA: Diagnosis not present

## 2019-08-16 DIAGNOSIS — H5203 Hypermetropia, bilateral: Secondary | ICD-10-CM | POA: Diagnosis not present

## 2019-08-21 DIAGNOSIS — H259 Unspecified age-related cataract: Secondary | ICD-10-CM | POA: Diagnosis not present

## 2019-08-21 DIAGNOSIS — M810 Age-related osteoporosis without current pathological fracture: Secondary | ICD-10-CM | POA: Diagnosis not present

## 2019-08-22 DIAGNOSIS — H9313 Tinnitus, bilateral: Secondary | ICD-10-CM | POA: Diagnosis not present

## 2019-08-22 DIAGNOSIS — S060X1S Concussion with loss of consciousness of 30 minutes or less, sequela: Secondary | ICD-10-CM | POA: Diagnosis not present

## 2019-08-22 DIAGNOSIS — H903 Sensorineural hearing loss, bilateral: Secondary | ICD-10-CM | POA: Diagnosis not present

## 2019-08-22 DIAGNOSIS — Z822 Family history of deafness and hearing loss: Secondary | ICD-10-CM | POA: Diagnosis not present

## 2019-09-27 DIAGNOSIS — N319 Neuromuscular dysfunction of bladder, unspecified: Secondary | ICD-10-CM | POA: Diagnosis not present

## 2019-09-27 DIAGNOSIS — L820 Inflamed seborrheic keratosis: Secondary | ICD-10-CM | POA: Diagnosis not present

## 2019-09-27 DIAGNOSIS — D1801 Hemangioma of skin and subcutaneous tissue: Secondary | ICD-10-CM | POA: Diagnosis not present

## 2019-09-27 DIAGNOSIS — Z85828 Personal history of other malignant neoplasm of skin: Secondary | ICD-10-CM | POA: Diagnosis not present

## 2019-09-27 DIAGNOSIS — D2272 Melanocytic nevi of left lower limb, including hip: Secondary | ICD-10-CM | POA: Diagnosis not present

## 2019-09-27 DIAGNOSIS — C539 Malignant neoplasm of cervix uteri, unspecified: Secondary | ICD-10-CM | POA: Diagnosis not present

## 2019-09-27 DIAGNOSIS — N390 Urinary tract infection, site not specified: Secondary | ICD-10-CM | POA: Diagnosis not present

## 2019-09-27 DIAGNOSIS — B351 Tinea unguium: Secondary | ICD-10-CM | POA: Diagnosis not present

## 2019-09-27 DIAGNOSIS — L821 Other seborrheic keratosis: Secondary | ICD-10-CM | POA: Diagnosis not present

## 2019-09-27 DIAGNOSIS — D2261 Melanocytic nevi of right upper limb, including shoulder: Secondary | ICD-10-CM | POA: Diagnosis not present

## 2019-09-27 DIAGNOSIS — L218 Other seborrheic dermatitis: Secondary | ICD-10-CM | POA: Diagnosis not present

## 2019-10-04 DIAGNOSIS — R350 Frequency of micturition: Secondary | ICD-10-CM | POA: Diagnosis not present

## 2019-10-19 DIAGNOSIS — H259 Unspecified age-related cataract: Secondary | ICD-10-CM | POA: Diagnosis not present

## 2019-10-19 DIAGNOSIS — M810 Age-related osteoporosis without current pathological fracture: Secondary | ICD-10-CM | POA: Diagnosis not present

## 2019-11-09 DIAGNOSIS — R509 Fever, unspecified: Secondary | ICD-10-CM | POA: Diagnosis not present

## 2019-11-09 DIAGNOSIS — R8290 Unspecified abnormal findings in urine: Secondary | ICD-10-CM | POA: Diagnosis not present

## 2019-11-10 DIAGNOSIS — Z1152 Encounter for screening for COVID-19: Secondary | ICD-10-CM | POA: Diagnosis not present

## 2019-11-10 DIAGNOSIS — R509 Fever, unspecified: Secondary | ICD-10-CM | POA: Diagnosis not present

## 2019-11-15 DIAGNOSIS — H259 Unspecified age-related cataract: Secondary | ICD-10-CM | POA: Diagnosis not present

## 2019-11-15 DIAGNOSIS — M810 Age-related osteoporosis without current pathological fracture: Secondary | ICD-10-CM | POA: Diagnosis not present

## 2019-11-20 DIAGNOSIS — S92505A Nondisplaced unspecified fracture of left lesser toe(s), initial encounter for closed fracture: Secondary | ICD-10-CM | POA: Diagnosis not present

## 2019-11-24 DIAGNOSIS — R6889 Other general symptoms and signs: Secondary | ICD-10-CM | POA: Diagnosis not present

## 2019-11-24 DIAGNOSIS — S9032XD Contusion of left foot, subsequent encounter: Secondary | ICD-10-CM | POA: Diagnosis not present

## 2019-11-27 DIAGNOSIS — S9032XD Contusion of left foot, subsequent encounter: Secondary | ICD-10-CM | POA: Diagnosis not present

## 2019-11-27 DIAGNOSIS — R6889 Other general symptoms and signs: Secondary | ICD-10-CM | POA: Diagnosis not present

## 2019-12-02 ENCOUNTER — Ambulatory Visit: Payer: PPO | Attending: Internal Medicine

## 2019-12-02 DIAGNOSIS — Z23 Encounter for immunization: Secondary | ICD-10-CM | POA: Insufficient documentation

## 2019-12-02 NOTE — Progress Notes (Signed)
   Covid-19 Vaccination Clinic  Name:  Regina [name]    MRN: [id number] DOB: 1941/07/07  12/02/2019  Regina [name] was observed post Covid-19 immunization for 30 minutes based on pre-vaccination screening without incidence. She was provided with Vaccine Information Sheet and instruction to access the V-Safe system.   Regina [name] was instructed to call 911 with any severe reactions post vaccine: Marland Kitchen Difficulty breathing  . Swelling of your face and throat  . A fast heartbeat  . A bad rash all over your body  . Dizziness and weakness    Immunizations Administered    Name Date Dose VIS Date Route   Pfizer COVID-19 Vaccine 12/02/2019  2:54 PM 0.3 mL 09/22/2019 Intramuscular   Manufacturer: [place]   Lot: [id number]   [place]: [id number]

## 2019-12-25 DIAGNOSIS — H40013 Open angle with borderline findings, low risk, bilateral: Secondary | ICD-10-CM | POA: Diagnosis not present

## 2019-12-25 DIAGNOSIS — H5319 Other subjective visual disturbances: Secondary | ICD-10-CM | POA: Diagnosis not present

## 2019-12-25 DIAGNOSIS — G43801 Other migraine, not intractable, with status migrainosus: Secondary | ICD-10-CM | POA: Diagnosis not present

## 2019-12-26 ENCOUNTER — Ambulatory Visit: Payer: PPO | Attending: Internal Medicine

## 2019-12-26 DIAGNOSIS — Z23 Encounter for immunization: Secondary | ICD-10-CM

## 2019-12-26 NOTE — Progress Notes (Signed)
   Covid-19 Vaccination Clinic  Name:  MATTILYNN WHITCOMB    MRN: [id number] DOB: October 21, 1940  12/26/2019  Ms. Pieczynski was observed post Covid-19 immunization for 15 minutes without incident. She was provided with Vaccine Information Sheet and instruction to access the V-Safe system.   Ms. Bayes was instructed to call 911 with any severe reactions post vaccine: Marland Kitchen Difficulty breathing  . Swelling of face and throat  . A fast heartbeat  . A bad rash all over body  . Dizziness and weakness   Immunizations Administered    Name Date Dose VIS Date Route   Pfizer COVID-19 Vaccine 12/26/2019  1:16 PM 0.3 mL 09/22/2019 Intramuscular   Manufacturer: [place]   Lot: [id number]   [place]: [id number]

## 2020-01-15 DIAGNOSIS — N390 Urinary tract infection, site not specified: Secondary | ICD-10-CM | POA: Diagnosis not present

## 2020-01-18 ENCOUNTER — Emergency Department (HOSPITAL_COMMUNITY): Payer: PPO

## 2020-01-18 ENCOUNTER — Encounter (HOSPITAL_COMMUNITY): Payer: Self-pay | Admitting: Emergency Medicine

## 2020-01-18 ENCOUNTER — Emergency Department (HOSPITAL_COMMUNITY)
Admission: EM | Admit: 2020-01-18 | Discharge: 2020-01-18 | Disposition: A | Discharge status: Home / Self Care | Payer: PPO | Attending: Emergency Medicine | Admitting: Emergency Medicine

## 2020-01-18 ENCOUNTER — Other Ambulatory Visit: Payer: Self-pay

## 2020-01-18 DIAGNOSIS — T3695XA Adverse effect of unspecified systemic antibiotic, initial encounter: Secondary | ICD-10-CM | POA: Diagnosis not present

## 2020-01-18 DIAGNOSIS — R1110 Vomiting, unspecified: Secondary | ICD-10-CM | POA: Diagnosis not present

## 2020-01-18 DIAGNOSIS — J45909 Unspecified asthma, uncomplicated: Secondary | ICD-10-CM | POA: Diagnosis not present

## 2020-01-18 DIAGNOSIS — R748 Abnormal levels of other serum enzymes: Secondary | ICD-10-CM | POA: Diagnosis not present

## 2020-01-18 DIAGNOSIS — R1010 Upper abdominal pain, unspecified: Secondary | ICD-10-CM | POA: Diagnosis not present

## 2020-01-18 DIAGNOSIS — Z87891 Personal history of nicotine dependence: Secondary | ICD-10-CM | POA: Insufficient documentation

## 2020-01-18 DIAGNOSIS — T50905A Adverse effect of unspecified drugs, medicaments and biological substances, initial encounter: Secondary | ICD-10-CM

## 2020-01-18 DIAGNOSIS — Z79899 Other long term (current) drug therapy: Secondary | ICD-10-CM | POA: Insufficient documentation

## 2020-01-18 DIAGNOSIS — N3090 Cystitis, unspecified without hematuria: Secondary | ICD-10-CM | POA: Diagnosis not present

## 2020-01-18 DIAGNOSIS — T50995A Adverse effect of other drugs, medicaments and biological substances, initial encounter: Secondary | ICD-10-CM | POA: Diagnosis not present

## 2020-01-18 DIAGNOSIS — R109 Unspecified abdominal pain: Secondary | ICD-10-CM | POA: Diagnosis present

## 2020-01-18 DIAGNOSIS — T887XXA Unspecified adverse effect of drug or medicament, initial encounter: Secondary | ICD-10-CM | POA: Insufficient documentation

## 2020-01-18 DIAGNOSIS — R112 Nausea with vomiting, unspecified: Secondary | ICD-10-CM | POA: Diagnosis not present

## 2020-01-18 LAB — URINALYSIS, ROUTINE W REFLEX MICROSCOPIC
Bilirubin Urine: NEGATIVE
Glucose, UA: NEGATIVE mg/dL
Hgb urine dipstick: NEGATIVE
Ketones, ur: 5 mg/dL — AB
Nitrite: POSITIVE — AB
Protein, ur: NEGATIVE mg/dL
Specific Gravity, Urine: >1.046 — ABNORMAL HIGH (ref 1.005–1.030)
WBC, UA: >50 WBC/hpf — ABNORMAL HIGH (ref 0–5)
pH: 6 (ref 5.0–8.0)

## 2020-01-18 LAB — COMPREHENSIVE METABOLIC PANEL
ALT: 26 U/L (ref 0–44)
AST: 24 U/L (ref 15–41)
Albumin: 3.8 g/dL (ref 3.5–5.0)
Alkaline Phosphatase: 65 U/L (ref 38–126)
Anion gap: 10 (ref 5–15)
BUN: 15 mg/dL (ref 8–23)
CO2: 24 mmol/L (ref 22–32)
Calcium: 8.8 mg/dL — ABNORMAL LOW (ref 8.9–10.3)
Chloride: 105 mmol/L (ref 98–111)
Creatinine, Ser: 0.78 mg/dL (ref 0.44–1.00)
GFR calc Af Amer: >60 mL/min (ref >60)
GFR calc non Af Amer: >60 mL/min (ref >60)
Glucose, Bld: 165 mg/dL — ABNORMAL HIGH (ref 70–99)
Potassium: 3.7 mmol/L (ref 3.5–5.1)
Sodium: 139 mmol/L (ref 135–145)
Total Bilirubin: 0.6 mg/dL (ref 0.3–1.2)
Total Protein: 6.7 g/dL (ref 6.5–8.1)

## 2020-01-18 LAB — CBC
HCT: 37.7 % (ref 36.0–46.0)
Hemoglobin: 12.3 g/dL (ref 12.0–15.0)
MCH: 31.1 pg (ref 26.0–34.0)
MCHC: 32.6 g/dL (ref 30.0–36.0)
MCV: 95.2 fL (ref 80.0–100.0)
Platelets: 171 10*3/uL (ref 150–400)
RBC: 3.96 MIL/uL (ref 3.87–5.11)
RDW: 14.1 % (ref 11.5–15.5)
WBC: 10.6 10*3/uL — ABNORMAL HIGH (ref 4.0–10.5)
nRBC: 0 % (ref 0.0–0.2)

## 2020-01-18 LAB — LIPASE, BLOOD: Lipase: 174 U/L — ABNORMAL HIGH (ref 11–51)

## 2020-01-18 MED ORDER — CEFTRIAXONE SODIUM 1 G IJ SOLR
500.0000 mg | Freq: Once | INTRAMUSCULAR | Status: AC
  Administered 2020-01-18: 500 mg via INTRAMUSCULAR
  Filled 2020-01-18: qty 10

## 2020-01-18 MED ORDER — SODIUM CHLORIDE (PF) 0.9 % IJ SOLN
INTRAMUSCULAR | Status: AC
  Filled 2020-01-18: qty 50

## 2020-01-18 MED ORDER — SODIUM CHLORIDE 0.9% FLUSH
3.0000 mL | Freq: Once | INTRAVENOUS | Status: AC
  Administered 2020-01-18: 3 mL via INTRAVENOUS

## 2020-01-18 MED ORDER — CEPHALEXIN 500 MG PO CAPS: 500.0000 mg | ORAL_CAPSULE | Freq: Three times a day (TID) | ORAL | 0 refills | Status: DC

## 2020-01-18 MED ORDER — ONDANSETRON HCL 4 MG PO TABS
4.0000 mg | ORAL_TABLET | Freq: Once | ORAL | Status: AC
  Administered 2020-01-18: 03:00:00 4 mg via ORAL
  Filled 2020-01-18: qty 1

## 2020-01-18 MED ORDER — IOHEXOL 300 MG/ML  SOLN
100.0000 mL | Freq: Once | INTRAMUSCULAR | Status: AC | PRN
  Administered 2020-01-18: 100 mL via INTRAVENOUS

## 2020-01-18 MED ORDER — STERILE WATER FOR INJECTION IJ SOLN
INTRAMUSCULAR | Status: AC
  Filled 2020-01-18: qty 10

## 2020-01-18 NOTE — ED Provider Notes (Signed)
[facility] DEPT Provider Note   CSN: [id number] Arrival date & time: 01/18/20  0039     History Chief Complaint  Patient presents with  . Abdominal Pain    Regina [name] is a 79 y.o. female.  HPI    79 year old female comes in a chief complaint of abdominal pain.  Patient has history of factor V [place], migraines.  She reports that she started having sudden onset abdominal pain around 10 PM.  Pain was epigastric and nonradiating.  Pain was described as sharp and severe.  Her pain has completely resolved by the time I saw her.  She had associated nausea and vomiting x2.  Patient unsure what triggered her pain, but she does suspect that it could be related to Bactrim that was prescribed yesterday.  She took 2 Bactrim sensitive 1 to get jumpstarted on her UTI.  There is no history of heavy alcohol consumption or any gallstones.  Past Medical History:  Diagnosis Date  . Headache   . Nervous system disorder    spinal cord tethered  . Osteoporosis     Patient Active Problem List   Diagnosis Date Noted  . Heterozygous factor V Leiden mutation ([employer]) 05/28/2016  . Amaurosis fugax of left eye 05/28/2016  . Migraine aura without headache 04/13/2016  . Asthma, mild intermittent 06/05/2014  . Cough 03/20/2014    Past Surgical History:  Procedure Laterality Date  . PARTIAL HYSTERECTOMY  1996  . TUBAL LIGATION       OB History   No obstetric history on file.     Family History  Problem Relation Age of Onset  . Asthma Mother   . COPD Mother   . Stroke Mother        TIA  . Heart attack Maternal Grandmother   . Heart attack Maternal Grandfather   . Cancer Paternal Grandmother   . Migraines Neg Hx     Social History   Tobacco Use  . Smoking status: Former Smoker    Packs/day: 2.00    Years: 4.00    Pack years: 8.00    Types: Cigarettes    Quit date: 12/10/1972    Years since quitting: 47.1  . Smokeless tobacco: Never Used  Substance  Use Topics  . Alcohol use: Yes    Alcohol/week: 0.0 standard drinks    Comment: 2-3 glasses wine/week  . Drug use: No    Home Medications Prior to Admission medications   Medication Sig Start Date End Date Taking? Authorizing Provider  Ascorbic Acid (VITAMIN C) 1000 MG tablet Take 1,000 mg by mouth daily.    Yes [provider]  B Complex Vitamins (VITAMIN-B COMPLEX) TABS Take 1 tablet by mouth daily.   Yes [provider]  calcium citrate-vitamin D (CITRACAL+D) 315-200 MG-UNIT tablet Take 1 tablet by mouth every morning.   Yes [provider]  Cholecalciferol (VITAMIN D3) 2000 units TABS Take 2,000 Units by mouth daily.   Yes [provider]  CHROMIUM PO Take 200 mg by mouth daily.   Yes [provider]  Lutein 6 MG CAPS Take 1 capsule by mouth daily.   Yes [provider]  Lysine ([employer] LYSINE) 1000 MG TABS Take 1 tablet by mouth daily.   Yes [provider]  Multiple Vitamins-Minerals (MACUVITE EYE CARE PO) Take 1 capsule by mouth daily.   Yes [provider]  Multiple Vitamins-Minerals (WOMENS BONE HEALTH PO) Take 1 tablet by mouth daily.   Yes  [provider]  cephALEXin (KEFLEX) 500 MG capsule Take 1 capsule (500 mg total) by mouth 3 (three) times daily. 01/18/20   Varney Biles, MD    Allergies    Other, Ketoprofen, Morphine, Morphine and related, Tramadol, Hydrocodone, Oxycodone, and Venlafaxine  Review of Systems   Review of Systems  Constitutional: Positive for activity change.  Respiratory: Negative for shortness of breath.   Cardiovascular: Negative for chest pain.  Gastrointestinal: Positive for abdominal pain.  Genitourinary: Positive for dysuria.  All other systems reviewed and are negative.   Physical Exam Updated Vital Signs BP 97/74   Pulse 100   Temp 98.1 F (36.7 C) (Oral)   Resp (!) 23   Ht 5\' 1"  (1.549 m)   Wt 59 kg   LMP 08/01/2014   SpO2 100%   BMI 24.56 kg/m    Physical Exam Vitals and nursing note reviewed.  Constitutional:      Appearance: She is well-developed.  HENT:     Head: Normocephalic and atraumatic.  Cardiovascular:     Rate and Rhythm: Normal rate.  Pulmonary:     Effort: Pulmonary effort is normal.  Abdominal:     General: Bowel sounds are normal.     Tenderness: There is abdominal tenderness in the epigastric area. There is no guarding or rebound.  Musculoskeletal:     Cervical back: Normal range of motion and neck supple.  Skin:    General: Skin is warm and dry.  Neurological:     Mental Status: She is alert and oriented to person, place, and time.     ED Results / Procedures / Treatments   Labs (all labs ordered are listed, but only abnormal results are displayed) Labs Reviewed  LIPASE, BLOOD - Abnormal; Notable for the following components:      Result Value   Lipase 174 (*)    All other components within normal limits  COMPREHENSIVE METABOLIC PANEL - Abnormal; Notable for the following components:   Glucose, Bld 165 (*)    Calcium 8.8 (*)    All other components within normal limits  CBC - Abnormal; Notable for the following components:   WBC 10.6 (*)    All other components within normal limits  URINALYSIS, ROUTINE W REFLEX MICROSCOPIC    EKG None  Radiology CT ABDOMEN PELVIS W CONTRAST  Result Date: 01/18/2020 CLINICAL DATA:  Abdominal pain with nausea and vomiting. EXAM: CT ABDOMEN AND PELVIS WITH CONTRAST TECHNIQUE: Multidetector CT imaging of the abdomen and pelvis was performed using the standard protocol following bolus administration of intravenous contrast. CONTRAST:  153mL OMNIPAQUE IOHEXOL 300 MG/ML  SOLN COMPARISON:  CT scan 07/19/2018 FINDINGS: Lower chest: The lung bases are clear of acute process. No pleural effusion or pulmonary lesions. Calcified granuloma noted at the left lung base. The heart is normal in size. No pericardial effusion. The distal esophagus and aorta are unremarkable.  Hepatobiliary: Small low-attenuation lesion in the right hepatic lobe peripherally demonstrates some peripheral nodular enhancement and is most consistent with a benign hepatic hemangioma. No change since prior MRI examination from 2015. Small calcified granulomas are noted. No worrisome hepatic lesions or intrahepatic biliary dilatation. The gallbladder is normal. No common bile duct dilatation. Pancreas: No mass, inflammation or ductal dilatation. Spleen: Calcified granulomas. Normal size. No worrisome lesions. Adrenals/Urinary Tract: Adrenal glands are normal. Small left renal cysts. No worrisome renal lesions or hydronephrosis. No renal or ureteral calculi. Diffusely thick walled enhancing bladder suggesting cystitis. Recommend correlation with urinalysis. No  mass or calculi. Stomach/Bowel: The stomach, duodenum, small bowel and colon are unremarkable. No acute inflammatory changes, mass lesions or obstructive findings. The terminal ileum is unremarkable. The appendix is not identified but no findings to suggest acute appendicitis. Moderate sigmoid colon diverticulosis but no findings for acute diverticulitis. Vascular/Lymphatic: The aorta and branch vessels are patent. Minimal scattered atherosclerotic calcifications. The major venous structures are patent. Circumaortic left renal vein is noted. No mesenteric or retroperitoneal mass or adenopathy. No pelvic adenopathy. Reproductive: The uterus is surgically absent. I believe the left ovary is still present and appears normal. I do not see the right ovary for certain. Other: No pelvic mass or adenopathy. No free pelvic fluid collections. No inguinal mass or adenopathy. No abdominal wall hernia or subcutaneous lesions. Musculoskeletal: No significant bony findings. IMPRESSION: 1. Diffusely thick walled enhancing bladder suggesting cystitis. Recommend correlation with urinalysis. 2. No other significant abdominal/pelvic findings, mass lesions or adenopathy. 3.  Stable benign-appearing right hepatic lobe hemangioma. Electronically Signed   By: Marijo Sanes M.D.   On: 01/18/2020 05:24    Procedures Procedures (including critical care time)  Medications Ordered in ED Medications  sterile water (preservative free) injection (has no administration in time range)  sodium chloride flush (NS) 0.9 % injection 3 mL (3 mLs Intravenous Given 01/18/20 0204)  ondansetron (ZOFRAN) tablet 4 mg (4 mg Oral Given 01/18/20 0320)  iohexol (OMNIPAQUE) 300 MG/ML solution 100 mL (100 mLs Intravenous Contrast Given 01/18/20 0455)  cefTRIAXone (ROCEPHIN) injection 500 mg (500 mg Intramuscular Given 01/18/20 0600)    ED Course  I have reviewed the triage vital signs and the nursing notes.  Pertinent labs & imaging results that were available during my care of the patient were reviewed by me and considered in my medical decision making (see chart for details).    MDM Rules/Calculators/A&P                      DDx includes: Pancreatitis Hepatobiliary pathology including cholecystitis Gastritis/PUD ACS  79 year old comes in a chief complaint of epigastric abdominal pain with nausea. Her pain has mostly resolved.  She has just some soreness now.  Exam shows no peritoneal findings. Patient's lipase is slightly elevated.  CT scan ordered and is negative for any acute findings.  Clinically does not seem like gastritis. It is possible that she could have had an allergic reaction from sulfa -which is a class I a medication that can cause pancreatitis.  6:35 AM The patient appears reasonably screened and/or stabilized for discharge and I doubt any other medical condition or other [facility] requiring further screening, evaluation, or treatment in the ED at this time prior to discharge.   Results from the ER workup discussed with the patient face to face and all questions answered to the best of my ability. The patient is safe for discharge with strict return precautions.   Final  Clinical Impression(s) / ED Diagnoses Final diagnoses:  Elevated lipase  Pain of upper abdomen  Medication side effect, initial encounter  Cystitis    Rx / DC Orders ED Discharge Orders         Ordered    cephALEXin (KEFLEX) 500 MG capsule  3 times daily     01/18/20 [id number]           Varney Biles, MD 01/18/20 [phone]

## 2020-01-18 NOTE — ED Triage Notes (Signed)
Patient complaining of abdominal pain, nausea, and vomiting.

## 2020-01-18 NOTE — Discharge Instructions (Addendum)
You are seen in the ER for abdominal pain.  CT scan is reassuring.  We suspect that you might be having side effects from Bactrim that was prescribed.  Instead of Bactrim please start taking Keflex for your UTI.  CT scan does show evidence of cystitis/bladder infection.  Please return to the ER if your symptoms worsen; you have increased pain, fevers, chills, inability to keep any medications down, confusion. Otherwise see the outpatient doctor as requested.

## 2020-01-26 DIAGNOSIS — H35363 Drusen (degenerative) of macula, bilateral: Secondary | ICD-10-CM | POA: Diagnosis not present

## 2020-01-26 DIAGNOSIS — H43813 Vitreous degeneration, bilateral: Secondary | ICD-10-CM | POA: Diagnosis not present

## 2020-01-30 DIAGNOSIS — N390 Urinary tract infection, site not specified: Secondary | ICD-10-CM | POA: Diagnosis not present

## 2020-02-14 DIAGNOSIS — N319 Neuromuscular dysfunction of bladder, unspecified: Secondary | ICD-10-CM | POA: Diagnosis not present

## 2020-02-14 DIAGNOSIS — N390 Urinary tract infection, site not specified: Secondary | ICD-10-CM | POA: Diagnosis not present

## 2020-02-14 DIAGNOSIS — N3946 Mixed incontinence: Secondary | ICD-10-CM | POA: Diagnosis not present

## 2020-02-16 DIAGNOSIS — H259 Unspecified age-related cataract: Secondary | ICD-10-CM | POA: Diagnosis not present

## 2020-02-16 DIAGNOSIS — M810 Age-related osteoporosis without current pathological fracture: Secondary | ICD-10-CM | POA: Diagnosis not present

## 2020-02-21 DIAGNOSIS — M810 Age-related osteoporosis without current pathological fracture: Secondary | ICD-10-CM | POA: Diagnosis not present

## 2020-02-21 DIAGNOSIS — R748 Abnormal levels of other serum enzymes: Secondary | ICD-10-CM | POA: Diagnosis not present

## 2020-02-21 DIAGNOSIS — R509 Fever, unspecified: Secondary | ICD-10-CM | POA: Diagnosis not present

## 2020-02-21 DIAGNOSIS — R8290 Unspecified abnormal findings in urine: Secondary | ICD-10-CM | POA: Diagnosis not present

## 2020-02-21 DIAGNOSIS — G43109 Migraine with aura, not intractable, without status migrainosus: Secondary | ICD-10-CM | POA: Diagnosis not present

## 2020-02-21 DIAGNOSIS — N390 Urinary tract infection, site not specified: Secondary | ICD-10-CM | POA: Diagnosis not present

## 2020-02-21 DIAGNOSIS — D6851 Activated protein C resistance: Secondary | ICD-10-CM | POA: Diagnosis not present

## 2020-02-21 DIAGNOSIS — K219 Gastro-esophageal reflux disease without esophagitis: Secondary | ICD-10-CM | POA: Diagnosis not present

## 2020-02-21 DIAGNOSIS — Z0000 Encounter for general adult medical examination without abnormal findings: Secondary | ICD-10-CM | POA: Diagnosis not present

## 2020-02-21 DIAGNOSIS — N319 Neuromuscular dysfunction of bladder, unspecified: Secondary | ICD-10-CM | POA: Diagnosis not present

## 2020-02-21 DIAGNOSIS — Q069 Congenital malformation of spinal cord, unspecified: Secondary | ICD-10-CM | POA: Diagnosis not present

## 2020-02-28 DIAGNOSIS — R509 Fever, unspecified: Secondary | ICD-10-CM | POA: Diagnosis not present

## 2020-02-28 DIAGNOSIS — R1013 Epigastric pain: Secondary | ICD-10-CM | POA: Diagnosis not present

## 2020-02-28 DIAGNOSIS — Z03818 Encounter for observation for suspected exposure to other biological agents ruled out: Secondary | ICD-10-CM | POA: Diagnosis not present

## 2020-02-28 DIAGNOSIS — R5383 Other fatigue: Secondary | ICD-10-CM | POA: Diagnosis not present

## 2020-02-28 DIAGNOSIS — M810 Age-related osteoporosis without current pathological fracture: Secondary | ICD-10-CM | POA: Diagnosis not present

## 2020-02-28 DIAGNOSIS — R519 Headache, unspecified: Secondary | ICD-10-CM | POA: Diagnosis not present

## 2020-03-12 ENCOUNTER — Telehealth: Payer: Self-pay

## 2020-03-12 ENCOUNTER — Telehealth: Payer: Self-pay | Admitting: *Deleted

## 2020-03-12 NOTE — Telephone Encounter (Signed)
Pt left a VM today requesting her records be faxed to [facility]. Fax is [phone]. Pt would like a call back at [phone].

## 2020-03-12 NOTE — Telephone Encounter (Signed)
Report faxed to [facility] [phone]

## 2020-03-28 DIAGNOSIS — Q068 Other specified congenital malformations of spinal cord: Secondary | ICD-10-CM | POA: Diagnosis not present

## 2020-03-28 DIAGNOSIS — D1779 Benign lipomatous neoplasm of other sites: Secondary | ICD-10-CM | POA: Diagnosis not present

## 2020-03-29 ENCOUNTER — Other Ambulatory Visit: Payer: Self-pay | Admitting: Neurosurgery

## 2020-03-29 DIAGNOSIS — H0288A Meibomian gland dysfunction right eye, upper and lower eyelids: Secondary | ICD-10-CM | POA: Diagnosis not present

## 2020-03-29 DIAGNOSIS — Q068 Other specified congenital malformations of spinal cord: Secondary | ICD-10-CM

## 2020-03-29 DIAGNOSIS — H43811 Vitreous degeneration, right eye: Secondary | ICD-10-CM | POA: Diagnosis not present

## 2020-03-29 DIAGNOSIS — H0288B Meibomian gland dysfunction left eye, upper and lower eyelids: Secondary | ICD-10-CM | POA: Diagnosis not present

## 2020-03-29 DIAGNOSIS — G9589 Other specified diseases of spinal cord: Secondary | ICD-10-CM

## 2020-04-08 DIAGNOSIS — M7918 Myalgia, other site: Secondary | ICD-10-CM | POA: Diagnosis not present

## 2020-04-08 DIAGNOSIS — N319 Neuromuscular dysfunction of bladder, unspecified: Secondary | ICD-10-CM | POA: Diagnosis not present

## 2020-04-08 DIAGNOSIS — R05 Cough: Secondary | ICD-10-CM | POA: Diagnosis not present

## 2020-04-08 DIAGNOSIS — K30 Functional dyspepsia: Secondary | ICD-10-CM | POA: Diagnosis not present

## 2020-04-08 DIAGNOSIS — D6851 Activated protein C resistance: Secondary | ICD-10-CM | POA: Diagnosis not present

## 2020-04-08 DIAGNOSIS — Q069 Congenital malformation of spinal cord, unspecified: Secondary | ICD-10-CM | POA: Diagnosis not present

## 2020-04-08 DIAGNOSIS — Z01419 Encounter for gynecological examination (general) (routine) without abnormal findings: Secondary | ICD-10-CM | POA: Diagnosis not present

## 2020-04-08 DIAGNOSIS — J310 Chronic rhinitis: Secondary | ICD-10-CM | POA: Diagnosis not present

## 2020-04-08 DIAGNOSIS — M810 Age-related osteoporosis without current pathological fracture: Secondary | ICD-10-CM | POA: Diagnosis not present

## 2020-04-08 DIAGNOSIS — Z0000 Encounter for general adult medical examination without abnormal findings: Secondary | ICD-10-CM | POA: Diagnosis not present

## 2020-04-13 DIAGNOSIS — S60862A Insect bite (nonvenomous) of left wrist, initial encounter: Secondary | ICD-10-CM | POA: Diagnosis not present

## 2020-04-16 ENCOUNTER — Ambulatory Visit
Admission: RE | Admit: 2020-04-16 | Discharge: 2020-04-16 | Disposition: A | Discharge status: Home / Self Care | Payer: PPO | Source: Ambulatory Visit | Attending: Neurosurgery | Admitting: Neurosurgery

## 2020-04-16 ENCOUNTER — Other Ambulatory Visit: Payer: Self-pay

## 2020-04-16 DIAGNOSIS — Q068 Other specified congenital malformations of spinal cord: Secondary | ICD-10-CM

## 2020-04-16 DIAGNOSIS — G9589 Other specified diseases of spinal cord: Secondary | ICD-10-CM

## 2020-04-16 DIAGNOSIS — M47814 Spondylosis without myelopathy or radiculopathy, thoracic region: Secondary | ICD-10-CM | POA: Diagnosis not present

## 2020-04-16 DIAGNOSIS — M5125 Other intervertebral disc displacement, thoracolumbar region: Secondary | ICD-10-CM | POA: Diagnosis not present

## 2020-04-16 MED ORDER — GADOBENATE DIMEGLUMINE 529 MG/ML IV SOLN
11.0000 mL | Freq: Once | INTRAVENOUS | Status: AC | PRN
  Administered 2020-04-16: 11 mL via INTRAVENOUS

## 2020-04-29 DIAGNOSIS — Z1231 Encounter for screening mammogram for malignant neoplasm of breast: Secondary | ICD-10-CM | POA: Diagnosis not present

## 2020-04-29 DIAGNOSIS — M810 Age-related osteoporosis without current pathological fracture: Secondary | ICD-10-CM | POA: Diagnosis not present

## 2020-05-03 DIAGNOSIS — W5501XA Bitten by cat, initial encounter: Secondary | ICD-10-CM | POA: Diagnosis not present

## 2020-05-03 DIAGNOSIS — Z23 Encounter for immunization: Secondary | ICD-10-CM | POA: Diagnosis not present

## 2020-05-03 DIAGNOSIS — S61259A Open bite of unspecified finger without damage to nail, initial encounter: Secondary | ICD-10-CM | POA: Diagnosis not present

## 2020-05-06 DIAGNOSIS — G5702 Lesion of sciatic nerve, left lower limb: Secondary | ICD-10-CM | POA: Diagnosis not present

## 2020-05-06 DIAGNOSIS — M7918 Myalgia, other site: Secondary | ICD-10-CM | POA: Diagnosis not present

## 2020-05-07 DIAGNOSIS — G5702 Lesion of sciatic nerve, left lower limb: Secondary | ICD-10-CM | POA: Insufficient documentation

## 2020-05-08 DIAGNOSIS — R921 Mammographic calcification found on diagnostic imaging of breast: Secondary | ICD-10-CM | POA: Diagnosis not present

## 2020-05-15 ENCOUNTER — Other Ambulatory Visit: Payer: Self-pay | Admitting: Radiology

## 2020-05-15 DIAGNOSIS — N6489 Other specified disorders of breast: Secondary | ICD-10-CM | POA: Diagnosis not present

## 2020-05-15 DIAGNOSIS — R921 Mammographic calcification found on diagnostic imaging of breast: Secondary | ICD-10-CM | POA: Diagnosis not present

## 2020-05-17 DIAGNOSIS — N3946 Mixed incontinence: Secondary | ICD-10-CM | POA: Diagnosis not present

## 2020-05-17 DIAGNOSIS — N319 Neuromuscular dysfunction of bladder, unspecified: Secondary | ICD-10-CM | POA: Diagnosis not present

## 2020-05-20 DIAGNOSIS — G5702 Lesion of sciatic nerve, left lower limb: Secondary | ICD-10-CM | POA: Diagnosis not present

## 2020-06-18 DIAGNOSIS — M7918 Myalgia, other site: Secondary | ICD-10-CM | POA: Diagnosis not present

## 2020-06-18 DIAGNOSIS — G5702 Lesion of sciatic nerve, left lower limb: Secondary | ICD-10-CM | POA: Diagnosis not present

## 2020-06-20 DIAGNOSIS — M25552 Pain in left hip: Secondary | ICD-10-CM | POA: Diagnosis not present

## 2020-06-20 DIAGNOSIS — Q069 Congenital malformation of spinal cord, unspecified: Secondary | ICD-10-CM | POA: Diagnosis not present

## 2020-06-20 DIAGNOSIS — N319 Neuromuscular dysfunction of bladder, unspecified: Secondary | ICD-10-CM | POA: Diagnosis not present

## 2020-06-20 DIAGNOSIS — M810 Age-related osteoporosis without current pathological fracture: Secondary | ICD-10-CM | POA: Diagnosis not present

## 2020-06-27 DIAGNOSIS — N6022 Fibroadenosis of left breast: Secondary | ICD-10-CM | POA: Diagnosis not present

## 2020-07-02 DIAGNOSIS — H52203 Unspecified astigmatism, bilateral: Secondary | ICD-10-CM | POA: Diagnosis not present

## 2020-07-02 DIAGNOSIS — H5202 Hypermetropia, left eye: Secondary | ICD-10-CM | POA: Diagnosis not present

## 2020-07-02 DIAGNOSIS — H43813 Vitreous degeneration, bilateral: Secondary | ICD-10-CM | POA: Diagnosis not present

## 2020-07-02 DIAGNOSIS — H0288A Meibomian gland dysfunction right eye, upper and lower eyelids: Secondary | ICD-10-CM | POA: Diagnosis not present

## 2020-07-02 DIAGNOSIS — H0288B Meibomian gland dysfunction left eye, upper and lower eyelids: Secondary | ICD-10-CM | POA: Diagnosis not present

## 2020-07-02 DIAGNOSIS — H40013 Open angle with borderline findings, low risk, bilateral: Secondary | ICD-10-CM | POA: Diagnosis not present

## 2020-07-02 DIAGNOSIS — H35363 Drusen (degenerative) of macula, bilateral: Secondary | ICD-10-CM | POA: Diagnosis not present

## 2020-07-02 DIAGNOSIS — H524 Presbyopia: Secondary | ICD-10-CM | POA: Diagnosis not present

## 2020-07-11 DIAGNOSIS — Z85828 Personal history of other malignant neoplasm of skin: Secondary | ICD-10-CM | POA: Diagnosis not present

## 2020-07-11 DIAGNOSIS — L570 Actinic keratosis: Secondary | ICD-10-CM | POA: Diagnosis not present

## 2020-07-11 DIAGNOSIS — L821 Other seborrheic keratosis: Secondary | ICD-10-CM | POA: Diagnosis not present

## 2020-07-11 DIAGNOSIS — D2371 Other benign neoplasm of skin of right lower limb, including hip: Secondary | ICD-10-CM | POA: Diagnosis not present

## 2020-07-11 DIAGNOSIS — I788 Other diseases of capillaries: Secondary | ICD-10-CM | POA: Diagnosis not present

## 2020-07-11 DIAGNOSIS — B351 Tinea unguium: Secondary | ICD-10-CM | POA: Diagnosis not present

## 2020-07-11 DIAGNOSIS — D1801 Hemangioma of skin and subcutaneous tissue: Secondary | ICD-10-CM | POA: Diagnosis not present

## 2020-07-18 DIAGNOSIS — R29898 Other symptoms and signs involving the musculoskeletal system: Secondary | ICD-10-CM | POA: Diagnosis not present

## 2020-07-18 DIAGNOSIS — Z7409 Other reduced mobility: Secondary | ICD-10-CM | POA: Diagnosis not present

## 2020-07-18 DIAGNOSIS — R2689 Other abnormalities of gait and mobility: Secondary | ICD-10-CM | POA: Diagnosis not present

## 2020-07-18 DIAGNOSIS — G5702 Lesion of sciatic nerve, left lower limb: Secondary | ICD-10-CM | POA: Diagnosis not present

## 2020-07-18 DIAGNOSIS — M7918 Myalgia, other site: Secondary | ICD-10-CM | POA: Diagnosis not present

## 2020-07-18 DIAGNOSIS — Z789 Other specified health status: Secondary | ICD-10-CM | POA: Diagnosis not present

## 2020-07-23 DIAGNOSIS — Z789 Other specified health status: Secondary | ICD-10-CM | POA: Diagnosis not present

## 2020-07-23 DIAGNOSIS — R29898 Other symptoms and signs involving the musculoskeletal system: Secondary | ICD-10-CM | POA: Diagnosis not present

## 2020-07-23 DIAGNOSIS — Z7409 Other reduced mobility: Secondary | ICD-10-CM | POA: Diagnosis not present

## 2020-07-23 DIAGNOSIS — R2689 Other abnormalities of gait and mobility: Secondary | ICD-10-CM | POA: Diagnosis not present

## 2020-07-23 DIAGNOSIS — M7918 Myalgia, other site: Secondary | ICD-10-CM | POA: Diagnosis not present

## 2020-07-23 DIAGNOSIS — G5702 Lesion of sciatic nerve, left lower limb: Secondary | ICD-10-CM | POA: Diagnosis not present

## 2020-08-02 DIAGNOSIS — Z8669 Personal history of other diseases of the nervous system and sense organs: Secondary | ICD-10-CM | POA: Diagnosis not present

## 2020-08-02 DIAGNOSIS — R29898 Other symptoms and signs involving the musculoskeletal system: Secondary | ICD-10-CM | POA: Diagnosis not present

## 2020-08-02 DIAGNOSIS — G8929 Other chronic pain: Secondary | ICD-10-CM | POA: Diagnosis not present

## 2020-08-02 DIAGNOSIS — G5702 Lesion of sciatic nerve, left lower limb: Secondary | ICD-10-CM | POA: Diagnosis not present

## 2020-08-02 DIAGNOSIS — M7918 Myalgia, other site: Secondary | ICD-10-CM | POA: Diagnosis not present

## 2020-08-08 DIAGNOSIS — G5702 Lesion of sciatic nerve, left lower limb: Secondary | ICD-10-CM | POA: Diagnosis not present

## 2020-08-08 DIAGNOSIS — M7918 Myalgia, other site: Secondary | ICD-10-CM | POA: Diagnosis not present

## 2020-08-08 DIAGNOSIS — R29898 Other symptoms and signs involving the musculoskeletal system: Secondary | ICD-10-CM | POA: Diagnosis not present

## 2020-08-08 DIAGNOSIS — Z8669 Personal history of other diseases of the nervous system and sense organs: Secondary | ICD-10-CM | POA: Diagnosis not present

## 2020-08-08 DIAGNOSIS — G8929 Other chronic pain: Secondary | ICD-10-CM | POA: Diagnosis not present

## 2020-08-23 DIAGNOSIS — G8929 Other chronic pain: Secondary | ICD-10-CM | POA: Diagnosis not present

## 2020-08-23 DIAGNOSIS — G5702 Lesion of sciatic nerve, left lower limb: Secondary | ICD-10-CM | POA: Diagnosis not present

## 2020-08-23 DIAGNOSIS — M7918 Myalgia, other site: Secondary | ICD-10-CM | POA: Diagnosis not present

## 2020-08-23 DIAGNOSIS — R29898 Other symptoms and signs involving the musculoskeletal system: Secondary | ICD-10-CM | POA: Diagnosis not present

## 2020-08-23 DIAGNOSIS — Z8669 Personal history of other diseases of the nervous system and sense organs: Secondary | ICD-10-CM | POA: Diagnosis not present

## 2020-08-25 ENCOUNTER — Ambulatory Visit: Payer: PPO | Attending: Internal Medicine

## 2020-08-25 DIAGNOSIS — M810 Age-related osteoporosis without current pathological fracture: Secondary | ICD-10-CM | POA: Diagnosis not present

## 2020-08-25 DIAGNOSIS — Z23 Encounter for immunization: Secondary | ICD-10-CM

## 2020-08-25 DIAGNOSIS — K219 Gastro-esophageal reflux disease without esophagitis: Secondary | ICD-10-CM | POA: Diagnosis not present

## 2020-08-25 DIAGNOSIS — G43109 Migraine with aura, not intractable, without status migrainosus: Secondary | ICD-10-CM | POA: Diagnosis not present

## 2020-08-25 DIAGNOSIS — H259 Unspecified age-related cataract: Secondary | ICD-10-CM | POA: Diagnosis not present

## 2020-08-25 NOTE — Progress Notes (Signed)
° °  Covid-19 Vaccination Clinic  Name:  TYRICA AFZAL    MRN: [id number] DOB: March 28, 1941  08/25/2020  Ms. Smethurst was observed post Covid-19 immunization for 15 minutes without incident. She was provided with Vaccine Information Sheet and instruction to access the V-Safe system.   Ms. Brickhouse was instructed to call 911 with any severe reactions post vaccine:  Difficulty breathing   Swelling of face and throat   A fast heartbeat   A bad rash all over body   Dizziness and weakness   Immunizations Administered    Name Date Dose VIS Date Route   Pfizer COVID-19 Vaccine 08/25/2020 12:53 PM 0.3 mL 07/31/2020 Intramuscular   Manufacturer: [place]   Lot: [id number]   [place]: [id number]

## 2020-08-27 DIAGNOSIS — M7918 Myalgia, other site: Secondary | ICD-10-CM | POA: Diagnosis not present

## 2020-08-27 DIAGNOSIS — G8929 Other chronic pain: Secondary | ICD-10-CM | POA: Diagnosis not present

## 2020-08-27 DIAGNOSIS — R29898 Other symptoms and signs involving the musculoskeletal system: Secondary | ICD-10-CM | POA: Diagnosis not present

## 2020-08-27 DIAGNOSIS — Z8669 Personal history of other diseases of the nervous system and sense organs: Secondary | ICD-10-CM | POA: Diagnosis not present

## 2020-08-27 DIAGNOSIS — G5702 Lesion of sciatic nerve, left lower limb: Secondary | ICD-10-CM | POA: Diagnosis not present

## 2020-09-25 DIAGNOSIS — E673 Hypervitaminosis D: Secondary | ICD-10-CM | POA: Diagnosis not present

## 2020-09-25 DIAGNOSIS — Z13220 Encounter for screening for lipoid disorders: Secondary | ICD-10-CM | POA: Diagnosis not present

## 2020-09-25 DIAGNOSIS — Z131 Encounter for screening for diabetes mellitus: Secondary | ICD-10-CM | POA: Diagnosis not present

## 2020-09-30 DIAGNOSIS — G5702 Lesion of sciatic nerve, left lower limb: Secondary | ICD-10-CM | POA: Diagnosis not present

## 2020-10-10 DIAGNOSIS — J22 Unspecified acute lower respiratory infection: Secondary | ICD-10-CM | POA: Diagnosis not present

## 2020-10-10 DIAGNOSIS — Z03818 Encounter for observation for suspected exposure to other biological agents ruled out: Secondary | ICD-10-CM | POA: Diagnosis not present

## 2020-11-05 DIAGNOSIS — M7918 Myalgia, other site: Secondary | ICD-10-CM | POA: Diagnosis not present

## 2020-11-05 DIAGNOSIS — G5702 Lesion of sciatic nerve, left lower limb: Secondary | ICD-10-CM | POA: Diagnosis not present

## 2020-11-13 DIAGNOSIS — N3946 Mixed incontinence: Secondary | ICD-10-CM | POA: Diagnosis not present

## 2020-11-13 DIAGNOSIS — N319 Neuromuscular dysfunction of bladder, unspecified: Secondary | ICD-10-CM | POA: Diagnosis not present

## 2020-11-14 DIAGNOSIS — M7918 Myalgia, other site: Secondary | ICD-10-CM | POA: Insufficient documentation

## 2020-11-18 DIAGNOSIS — G5702 Lesion of sciatic nerve, left lower limb: Secondary | ICD-10-CM | POA: Diagnosis not present

## 2020-11-18 DIAGNOSIS — M7918 Myalgia, other site: Secondary | ICD-10-CM | POA: Diagnosis not present

## 2020-12-09 DIAGNOSIS — H259 Unspecified age-related cataract: Secondary | ICD-10-CM | POA: Diagnosis not present

## 2020-12-09 DIAGNOSIS — M810 Age-related osteoporosis without current pathological fracture: Secondary | ICD-10-CM | POA: Diagnosis not present

## 2020-12-09 DIAGNOSIS — K219 Gastro-esophageal reflux disease without esophagitis: Secondary | ICD-10-CM | POA: Diagnosis not present

## 2020-12-09 DIAGNOSIS — G43109 Migraine with aura, not intractable, without status migrainosus: Secondary | ICD-10-CM | POA: Diagnosis not present

## 2020-12-17 DIAGNOSIS — M7918 Myalgia, other site: Secondary | ICD-10-CM | POA: Diagnosis not present

## 2020-12-17 DIAGNOSIS — G5702 Lesion of sciatic nerve, left lower limb: Secondary | ICD-10-CM | POA: Diagnosis not present

## 2020-12-30 DIAGNOSIS — H40013 Open angle with borderline findings, low risk, bilateral: Secondary | ICD-10-CM | POA: Diagnosis not present

## 2020-12-30 DIAGNOSIS — H353131 Nonexudative age-related macular degeneration, bilateral, early dry stage: Secondary | ICD-10-CM | POA: Diagnosis not present

## 2020-12-30 DIAGNOSIS — H35363 Drusen (degenerative) of macula, bilateral: Secondary | ICD-10-CM | POA: Diagnosis not present

## 2021-01-08 DIAGNOSIS — B351 Tinea unguium: Secondary | ICD-10-CM | POA: Diagnosis not present

## 2021-01-08 DIAGNOSIS — L821 Other seborrheic keratosis: Secondary | ICD-10-CM | POA: Diagnosis not present

## 2021-01-08 DIAGNOSIS — D1801 Hemangioma of skin and subcutaneous tissue: Secondary | ICD-10-CM | POA: Diagnosis not present

## 2021-01-08 DIAGNOSIS — D1723 Benign lipomatous neoplasm of skin and subcutaneous tissue of right leg: Secondary | ICD-10-CM | POA: Diagnosis not present

## 2021-01-08 DIAGNOSIS — R059 Cough, unspecified: Secondary | ICD-10-CM | POA: Diagnosis not present

## 2021-01-08 DIAGNOSIS — D224 Melanocytic nevi of scalp and neck: Secondary | ICD-10-CM | POA: Diagnosis not present

## 2021-01-08 DIAGNOSIS — Z85828 Personal history of other malignant neoplasm of skin: Secondary | ICD-10-CM | POA: Diagnosis not present

## 2021-01-16 DIAGNOSIS — J310 Chronic rhinitis: Secondary | ICD-10-CM | POA: Diagnosis not present

## 2021-01-16 DIAGNOSIS — Z87898 Personal history of other specified conditions: Secondary | ICD-10-CM | POA: Diagnosis not present

## 2021-01-16 DIAGNOSIS — M25552 Pain in left hip: Secondary | ICD-10-CM | POA: Diagnosis not present

## 2021-01-16 DIAGNOSIS — K30 Functional dyspepsia: Secondary | ICD-10-CM | POA: Diagnosis not present

## 2021-01-16 DIAGNOSIS — R053 Chronic cough: Secondary | ICD-10-CM | POA: Diagnosis not present

## 2021-01-16 DIAGNOSIS — M810 Age-related osteoporosis without current pathological fracture: Secondary | ICD-10-CM | POA: Diagnosis not present

## 2021-01-16 DIAGNOSIS — Z23 Encounter for immunization: Secondary | ICD-10-CM | POA: Diagnosis not present

## 2021-01-16 DIAGNOSIS — G43109 Migraine with aura, not intractable, without status migrainosus: Secondary | ICD-10-CM | POA: Diagnosis not present

## 2021-01-16 DIAGNOSIS — R1031 Right lower quadrant pain: Secondary | ICD-10-CM | POA: Diagnosis not present

## 2021-01-16 DIAGNOSIS — R252 Cramp and spasm: Secondary | ICD-10-CM | POA: Diagnosis not present

## 2021-01-21 DIAGNOSIS — G5702 Lesion of sciatic nerve, left lower limb: Secondary | ICD-10-CM | POA: Diagnosis not present

## 2021-01-29 DIAGNOSIS — N281 Cyst of kidney, acquired: Secondary | ICD-10-CM | POA: Diagnosis not present

## 2021-01-29 DIAGNOSIS — N1330 Unspecified hydronephrosis: Secondary | ICD-10-CM | POA: Diagnosis not present

## 2021-02-11 DIAGNOSIS — N6022 Fibroadenosis of left breast: Secondary | ICD-10-CM | POA: Diagnosis not present

## 2021-02-17 ENCOUNTER — Encounter: Payer: Self-pay | Admitting: Internal Medicine

## 2021-02-17 ENCOUNTER — Ambulatory Visit (INDEPENDENT_AMBULATORY_CARE_PROVIDER_SITE_OTHER): Payer: PPO | Admitting: Internal Medicine

## 2021-02-17 ENCOUNTER — Other Ambulatory Visit: Payer: Self-pay

## 2021-02-17 VITALS — BP 118/80 | HR 78 | Ht 61.0 in | Wt 137.0 lb

## 2021-02-17 DIAGNOSIS — R053 Chronic cough: Secondary | ICD-10-CM

## 2021-02-17 MED ORDER — MONTELUKAST SODIUM 10 MG PO TABS: 10.0000 mg | ORAL_TABLET | Freq: Every day | ORAL | 11 refills | Status: DC

## 2021-02-17 NOTE — Progress Notes (Signed)
Regina [name]    [id number]    08/16/1941  Primary Care Physician:McNeill, Abigail Butts, MD  Referring Physician: Cari Caraway, MD [place],  [place] [address] Reason for Consultation: chronic cough Date of Consultation: 02/17/2021  Chief complaint:   Chief Complaint  Patient presents with  . Consult    Cough x 15 plus years     HPI: Regina [name] is a 80 y.o. woman with history of chronic cough for over 15 years but has gotten worse over the last year. Productive with mucus, usually yellow or clear. Cough is every day, but does not wake her up from sleep. She feels she can soak hankerchefs with her mucus.   She has had trouble identifying a predictable trigger for cough. Sometimes it is worse after eating (15-20 minutes), sometimes it isn't. In the past breakfast has been a predictable trigger.   She denies reflux or heart burn, but it has happened frequently enough that she lays flat and breathing gets worse.  Milk products usually makes it worse. But it also happens after dinner.   No shortness of breath, chest tightness, wheezing. She has had the occasional coughing fit which makes her lose her breath and makes her ribs hurt.   She takes mucinex which helps reduce the amount of mucus, especially related to sinus drainage.   She has an albuterol inhaler but this does not improve the cough.   She does have chronic rhinitis and takes mucinex. She was started recently on it - fluticasone from [employer]. She isn't sure if it's helping. She takes a once a day OTC anti-histamine, not sure what the name of it is.   Takes omeprazole 40 mg BID, forgets her evening dose.  He has seen Dr. Cristina Gong and had an EGD in 2011 and had a small hiatal hernia. No dysphagia. No nausea or vomiting.   No fevers, chills, night sweats weight loss.    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 Exposures: lives at home  Smoking history: remote, 50 years ago.   Social History   Occupational History  . Occupation: Self employed  Tobacco Use  . Smoking status: Former Smoker    Packs/day: 3.00    Years: 4.00    Pack years: 12.00    Types: Cigarettes    Quit date: 12/10/1972    Years since quitting: 48.2  . Smokeless tobacco: Never Used  Substance and Sexual Activity  . Alcohol use: Yes    Alcohol/week: 0.0 standard drinks    Comment: 2-3 glasses wine/week  . Drug use: No  . Sexual activity: Not on file    Relevant family history:  Family History  Problem Relation Age of Onset  . Asthma Mother   . COPD Mother   . Stroke Mother        TIA  . Heart attack Maternal Grandmother   . Heart attack Maternal Grandfather   . Cancer Paternal Grandmother   . Migraines Neg Hx     Past Medical History:  Diagnosis Date  . Headache   . Nervous system disorder    spinal cord tethered  . Osteoporosis     Past Surgical History:  Procedure Laterality Date  . PARTIAL HYSTERECTOMY  1996  . TUBAL LIGATION       Physical Exam: Blood pressure 118/80, pulse 78, height 5\' 1"  (1.549 m), weight  137 lb (62.1 kg), last menstrual period 08/01/2014, SpO2 98 %. Gen:      No acute distress ENT:  no nasal polyps, mucus membranes moist, cobblestoning in orophyarnx Lungs:    No increased respiratory effort, symmetric chest wall excursion, clear to auscultation bilaterally, no wheezes or crackles CV:         Regular rate and rhythm; no murmurs, rubs, or gallops.  No pedal edema Abd:      + bowel sounds; soft, non-tender; no distension MSK: no acute synovitis of DIP or PIP joints, no mechanics hands.  Skin:      Warm and dry; no rashes Neuro: normal speech, no focal facial asymmetry Psych: alert and oriented x3, normal mood and affect   Data Reviewed/Medical Decision Making:  Independent interpretation of tests: Imaging: . Review of patient's Chest xray images 2014 revealed no  acute cardiopulmonary process. The patient's images have been independently reviewed by me.    PFTs: I have personally reviewed the patient's PFTs and show normal pulmonary function PFT Results Latest Ref Rng & Units 06/05/2014  FVC-Pre L 2.32  FVC-Predicted Pre % 81  FVC-Post L 2.58  FVC-Predicted Post % 90  Pre FEV1/FVC % % 79  Post FEV1/FCV % % 81  FEV1-Pre L 1.82  FEV1-Predicted Pre % 84  FEV1-Post L 2.09  DLCO uncorrected ml/min/mmHg 17.68  DLCO UNC% % 77  DLVA Predicted % 96  TLC L 4.27  TLC % Predicted % 87  RV % Predicted % 84    Labs:  Lab Results  Component Value Date   WBC 10.6 (H) 01/18/2020   HGB 12.3 01/18/2020   HCT 37.7 01/18/2020   MCV 95.2 01/18/2020   PLT 171 01/18/2020   Lab Results  Component Value Date   NA 139 01/18/2020   K 3.7 01/18/2020   CL 105 01/18/2020   CO2 24 01/18/2020     Immunization status:  Immunization History  Administered Date(s) Administered  . PFIZER(Purple Top)SARS-COV-2 Vaccination 12/02/2019, 12/26/2019, 08/25/2020    . I reviewed prior external note(s) from ENT, PCP, Pulmonary . I reviewed the result(s) of the labs and imaging as noted above.  . I have ordered    Assessment:  Chronic Cough  Plan/Recommendations: Ms. Dizdarevic is a 80 year old woman with history of chronic cough over 15 years.  Symptoms are worsened of late in the last year or so.  She has had pulmonary evaluation including PFTs and chest x-rays noncontributory.  Her symptoms do not sound consistent with asthma or any kind of pulmonary process.  I think her cough is mostly multifactorial from chronic sinus drainage with a large component of gastroesophageal reflux disease.  She does have a known hiatal hernia previously and I wonder if this is worsened.  She has been on omeprazole twice daily 40 mg for many years but often forgets the evening dose.  I recommend she start taking it twice a day.  I did advise her on things significant lifestyle  modifications for reflux including abstaining from caffeine, alcohol, carbonated beverages.  She is already put a lot of thought into what in her diet might be contributing to this reflux and identified milk of the trigger.  I think she needs bioimpedance testing for acid and nonacid reflux.  I have advised her to follow-up with Dr. Cristina Gong again.  For her chronic rhinitis she should continue taking antihistamine, and Flonase.  I have corrected her Flonase technique.  She is hesitant to try additional medications, but  neck step would be to add montelukast.  I have prescribed this if she decides she wants to take it.  She has seen ENT and has CT sinuses in 2019 when she saw Dr. Redmond Baseman.  There is no significant sinus disease.  If Flonase not beneficial can also try ipratropium nasal spray for vasomotor rhinitis.  We discussed disease management and progression at length today.   I spent 45 minutes in the care of this patient today including pre-charting, chart review, review of results, face-to-face care, coordination of care and communication with consultants etc.).   Return to Care: Return in about 6 months (around 08/20/2021).  Lenice Llamas, MD Pulmonary and [place]  CC: Cari Caraway, MD

## 2021-02-17 NOTE — Patient Instructions (Addendum)
The patient should have follow up scheduled with myself in 6 months.   I recommend following up with GI for reflux.   Omeprazole continue 40 mg twice daily.   Cetirizine 10 mg once daily. - at night time because it can make you sleepy.  I would recommend singulair or montelukast for your rhinitis  Try nasal saline in between for your rhinitis throughout the day Fluticasone- 1 spray on each side of your nose twice a day for first week, then 1 spray on each side.   Instructions for use:  If you also use a saline nasal spray or rinse, use that first.  Position the head with the chin slightly tucked. Use the right hand to spray into the left nostril and the right hand to spray into the left nostril.   Point the bottle away from the septum of your nose (cartilage that divides the two sides of your nose).   Hold the nostril closed on the opposite side from where you will spray  Spray once and gently sniff to pull the medicine into the higher parts of your nose.  Don't sniff too hard as the medicine will drain down the back of your throat instead.  Repeat with a second spray on the same side if prescribed.  Repeat on the other side of your nose.  What is GERD? Gastroesophageal reflux disease (GERD) is gastroesophageal reflux diseasewhich occurs when the lower esophageal sphincter (LES) opens spontaneously, for varying periods of time, or does not close properly and stomach contents rise up into the esophagus. GER is also called acid reflux or acid regurgitation, because digestive juices--called acids--rise up with the food. The esophagus is the tube that carries food from the mouth to the stomach. The LES is a ring of muscle at the bottom of the esophagus that acts like a valve between the esophagus and stomach.  When acid reflux occurs, food or fluid can be tasted in the back of the mouth. When refluxed stomach acid touches the lining of the esophagus it may cause a burning sensation in the  chest or throat called heartburn or acid indigestion. Occasional reflux is common. Persistent reflux that occurs more than twice a week is considered GERD, and it can eventually lead to more serious health problems. People of all ages can have GERD. Studies have shown that GERD may worsen or contribute to asthma, chronic cough, and pulmonary fibrosis.   What are the symptoms of GERD? The main symptom of GERD in adults is frequent heartburn, also called acid indigestion--burning-type pain in the lower part of the mid-chest, behind the breast bone, and in the mid-abdomen.  Not all reflux is acidic in nature, and many patients don't have heart burn at all. Sometimes it feels like a cough (either dry or with mucus), choking sensation, asthma, shortness of breath, waking up at night, frequent throat clearing, or trouble swallowing.    What causes GERD? The reason some people develop GERD is still unclear. However, research shows that in people with GERD, the LES relaxes while the rest of the esophagus is working. Anatomical abnormalities such as a hiatal hernia may also contribute to GERD. A hiatal hernia occurs when the upper part of the stomach and the LES move above the diaphragm, the muscle wall that separates the stomach from the chest. Normally, the diaphragm helps the LES keep acid from rising up into the esophagus. When a hiatal hernia is present, acid reflux can occur more easily. A hiatal hernia  can occur in people of any age and is most often a normal finding in otherwise healthy people over age 22. Most of the time, a hiatal hernia produces no symptoms.   Other factors that may contribute to GERD include - Obesity or recent weight gain - Pregnancy  - Smoking  - Diet - Certain medications  Common foods that can worsen reflux symptoms include: - carbonated beverages - artificial sweeteners - citrus fruits  - chocolate  - drinks with caffeine or alcohol  - fatty and fried foods  - garlic  and onions  - mint flavorings  - spicy foods  - tomato-based foods, like spaghetti sauce, salsa, chili, and pizza   Lifestyle Changes If you smoke, stop.  Avoid foods and beverages that worsen symptoms (see above.) Lose weight if needed.  Eat small, frequent meals.  Wear loose-fitting clothes.  Avoid lying down for 3 hours after a meal.  Raise the head of your bed 6 to 8 inches by securing wood blocks under the bedposts. Just using extra pillows will not help, but using a wedge-shaped pillow may be helpful.  Medications  H2 blockers, such as cimetidine (Tagamet HB), famotidine (Pepcid AC), nizatidine (Axid AR), and ranitidine (Zantac 75), decrease acid production. They are available in prescription strength and over-the-counter strength. These drugs provide short-term relief and are effective for about half of those who have GERD symptoms.  Proton pump inhibitors include omeprazole (Prilosec, Zegerid), lansoprazole (Prevacid), pantoprazole (Protonix), rabeprazole (Aciphex), and esomeprazole (Nexium), which are available by prescription. Prilosec is also available in over-the-counter strength. Proton pump inhibitors are more effective than H2 blockers and can relieve symptoms and heal the esophageal lining in almost everyone who has GERD.  Because drugs work in different ways, combinations of medications may help control symptoms. People who get heartburn after eating may take both antacids and H2 blockers. The antacids work first to neutralize the acid in the stomach, and then the H2 blockers act on acid production. By the time the antacid stops working, the H2 blocker will have stopped acid production. Your health care provider is the best source of information about how to use medications for GERD.   Points to Remember 1. You can have GERD without having heartburn. Your symptoms could include a dry cough, asthma symptoms, or trouble swallowing.  2. Taking medications daily as prescribed  is important in controlling you symptoms.  Sometimes it can take up to 8 weeks to fully achieve the effects of the medications prescribed.  3. Coughing related to GERD can be difficult to treat and is very frustrating!  However, it is important to stick with these medications and lifestyle modifications before pursuing more aggressive or invasive test and treatments.

## 2021-02-18 DIAGNOSIS — M810 Age-related osteoporosis without current pathological fracture: Secondary | ICD-10-CM | POA: Diagnosis not present

## 2021-02-18 DIAGNOSIS — K219 Gastro-esophageal reflux disease without esophagitis: Secondary | ICD-10-CM | POA: Diagnosis not present

## 2021-02-18 DIAGNOSIS — G43109 Migraine with aura, not intractable, without status migrainosus: Secondary | ICD-10-CM | POA: Diagnosis not present

## 2021-02-18 DIAGNOSIS — H259 Unspecified age-related cataract: Secondary | ICD-10-CM | POA: Diagnosis not present

## 2021-02-25 DIAGNOSIS — M533 Sacrococcygeal disorders, not elsewhere classified: Secondary | ICD-10-CM | POA: Diagnosis not present

## 2021-02-25 DIAGNOSIS — G5702 Lesion of sciatic nerve, left lower limb: Secondary | ICD-10-CM | POA: Diagnosis not present

## 2021-02-25 DIAGNOSIS — M7918 Myalgia, other site: Secondary | ICD-10-CM | POA: Diagnosis not present

## 2021-03-18 DIAGNOSIS — M25552 Pain in left hip: Secondary | ICD-10-CM | POA: Diagnosis not present

## 2021-03-18 DIAGNOSIS — Z6823 Body mass index (BMI) 23.0-23.9, adult: Secondary | ICD-10-CM | POA: Diagnosis not present

## 2021-03-18 DIAGNOSIS — Z79899 Other long term (current) drug therapy: Secondary | ICD-10-CM | POA: Diagnosis not present

## 2021-03-18 DIAGNOSIS — J310 Chronic rhinitis: Secondary | ICD-10-CM | POA: Diagnosis not present

## 2021-03-18 DIAGNOSIS — K30 Functional dyspepsia: Secondary | ICD-10-CM | POA: Diagnosis not present

## 2021-03-18 DIAGNOSIS — R053 Chronic cough: Secondary | ICD-10-CM | POA: Diagnosis not present

## 2021-03-20 DIAGNOSIS — M25552 Pain in left hip: Secondary | ICD-10-CM | POA: Diagnosis not present

## 2021-03-25 ENCOUNTER — Ambulatory Visit
Admission: RE | Admit: 2021-03-25 | Discharge: 2021-03-25 | Disposition: A | Discharge status: Home / Self Care | Payer: PPO | Source: Ambulatory Visit | Attending: Sports Medicine | Admitting: Sports Medicine

## 2021-03-25 ENCOUNTER — Other Ambulatory Visit: Payer: Self-pay | Admitting: Sports Medicine

## 2021-03-25 ENCOUNTER — Other Ambulatory Visit: Payer: Self-pay

## 2021-03-25 DIAGNOSIS — M25552 Pain in left hip: Secondary | ICD-10-CM

## 2021-03-25 DIAGNOSIS — N816 Rectocele: Secondary | ICD-10-CM | POA: Diagnosis not present

## 2021-04-07 DIAGNOSIS — M25552 Pain in left hip: Secondary | ICD-10-CM | POA: Diagnosis not present

## 2021-04-08 DIAGNOSIS — R922 Inconclusive mammogram: Secondary | ICD-10-CM | POA: Diagnosis not present

## 2021-04-08 DIAGNOSIS — N6082 Other benign mammary dysplasias of left breast: Secondary | ICD-10-CM | POA: Diagnosis not present

## 2021-04-15 DIAGNOSIS — J988 Other specified respiratory disorders: Secondary | ICD-10-CM | POA: Diagnosis not present

## 2021-04-15 DIAGNOSIS — Z136 Encounter for screening for cardiovascular disorders: Secondary | ICD-10-CM | POA: Diagnosis not present

## 2021-04-15 DIAGNOSIS — Z13220 Encounter for screening for lipoid disorders: Secondary | ICD-10-CM | POA: Diagnosis not present

## 2021-04-15 DIAGNOSIS — E673 Hypervitaminosis D: Secondary | ICD-10-CM | POA: Diagnosis not present

## 2021-04-15 DIAGNOSIS — M25552 Pain in left hip: Secondary | ICD-10-CM | POA: Diagnosis not present

## 2021-04-15 DIAGNOSIS — Z6823 Body mass index (BMI) 23.0-23.9, adult: Secondary | ICD-10-CM | POA: Diagnosis not present

## 2021-04-15 DIAGNOSIS — Z79899 Other long term (current) drug therapy: Secondary | ICD-10-CM | POA: Diagnosis not present

## 2021-04-15 DIAGNOSIS — R053 Chronic cough: Secondary | ICD-10-CM | POA: Diagnosis not present

## 2021-04-15 DIAGNOSIS — M810 Age-related osteoporosis without current pathological fracture: Secondary | ICD-10-CM | POA: Diagnosis not present

## 2021-04-15 DIAGNOSIS — J22 Unspecified acute lower respiratory infection: Secondary | ICD-10-CM | POA: Diagnosis not present

## 2021-04-15 DIAGNOSIS — Z131 Encounter for screening for diabetes mellitus: Secondary | ICD-10-CM | POA: Diagnosis not present

## 2021-04-15 DIAGNOSIS — K30 Functional dyspepsia: Secondary | ICD-10-CM | POA: Diagnosis not present

## 2021-04-17 DIAGNOSIS — M810 Age-related osteoporosis without current pathological fracture: Secondary | ICD-10-CM | POA: Diagnosis not present

## 2021-04-17 DIAGNOSIS — Q069 Congenital malformation of spinal cord, unspecified: Secondary | ICD-10-CM | POA: Diagnosis not present

## 2021-04-17 DIAGNOSIS — R053 Chronic cough: Secondary | ICD-10-CM | POA: Diagnosis not present

## 2021-04-17 DIAGNOSIS — Z0000 Encounter for general adult medical examination without abnormal findings: Secondary | ICD-10-CM | POA: Diagnosis not present

## 2021-04-17 DIAGNOSIS — J310 Chronic rhinitis: Secondary | ICD-10-CM | POA: Diagnosis not present

## 2021-04-17 DIAGNOSIS — G453 Amaurosis fugax: Secondary | ICD-10-CM | POA: Diagnosis not present

## 2021-04-17 DIAGNOSIS — D6851 Activated protein C resistance: Secondary | ICD-10-CM | POA: Diagnosis not present

## 2021-04-17 DIAGNOSIS — Z1389 Encounter for screening for other disorder: Secondary | ICD-10-CM | POA: Diagnosis not present

## 2021-04-17 DIAGNOSIS — K30 Functional dyspepsia: Secondary | ICD-10-CM | POA: Diagnosis not present

## 2021-04-17 DIAGNOSIS — N319 Neuromuscular dysfunction of bladder, unspecified: Secondary | ICD-10-CM | POA: Diagnosis not present

## 2021-04-17 DIAGNOSIS — F418 Other specified anxiety disorders: Secondary | ICD-10-CM | POA: Diagnosis not present

## 2021-04-17 DIAGNOSIS — M25552 Pain in left hip: Secondary | ICD-10-CM | POA: Diagnosis not present

## 2021-04-24 DIAGNOSIS — M25552 Pain in left hip: Secondary | ICD-10-CM | POA: Diagnosis not present

## 2021-04-29 DIAGNOSIS — M5442 Lumbago with sciatica, left side: Secondary | ICD-10-CM | POA: Diagnosis not present

## 2021-05-06 DIAGNOSIS — M25552 Pain in left hip: Secondary | ICD-10-CM | POA: Diagnosis not present

## 2021-05-09 ENCOUNTER — Ambulatory Visit: Payer: PPO | Attending: Internal Medicine

## 2021-05-09 ENCOUNTER — Other Ambulatory Visit: Payer: Self-pay

## 2021-05-09 DIAGNOSIS — Z23 Encounter for immunization: Secondary | ICD-10-CM

## 2021-05-09 NOTE — Progress Notes (Signed)
   Covid-[facility] Vaccination [facility]  Name:  Regina [name]    MRN: [id number] DOB: [birth year]  05/09/2021  [name] was observed post Covid-19 immunization for 15 minutes without incident. She was provided with Vaccine Information Sheet and instruction to access the V-Safe system.   [name] was instructed to call 911 with any severe reactions post vaccine: Difficulty breathing  Swelling of face and throat  A fast heartbeat  A bad rash all over body  Dizziness and weakness   Immunizations Administered     Name Date Dose VIS Date Route   PFIZER Comrnaty(Gray TOP) Covid-19 Vaccine 05/09/2021 10:56 AM 0.3 mL 09/19/2020 Intramuscular   Manufacturer: [place]   Lot: [id number]   [place]: [phone]

## 2021-05-20 DIAGNOSIS — K219 Gastro-esophageal reflux disease without esophagitis: Secondary | ICD-10-CM | POA: Diagnosis not present

## 2021-05-20 DIAGNOSIS — H259 Unspecified age-related cataract: Secondary | ICD-10-CM | POA: Diagnosis not present

## 2021-05-20 DIAGNOSIS — G43109 Migraine with aura, not intractable, without status migrainosus: Secondary | ICD-10-CM | POA: Diagnosis not present

## 2021-05-20 DIAGNOSIS — M810 Age-related osteoporosis without current pathological fracture: Secondary | ICD-10-CM | POA: Diagnosis not present

## 2021-05-22 DIAGNOSIS — M25552 Pain in left hip: Secondary | ICD-10-CM | POA: Diagnosis not present

## 2021-06-25 DIAGNOSIS — H0288B Meibomian gland dysfunction left eye, upper and lower eyelids: Secondary | ICD-10-CM | POA: Diagnosis not present

## 2021-06-25 DIAGNOSIS — H40013 Open angle with borderline findings, low risk, bilateral: Secondary | ICD-10-CM | POA: Diagnosis not present

## 2021-06-25 DIAGNOSIS — H5202 Hypermetropia, left eye: Secondary | ICD-10-CM | POA: Diagnosis not present

## 2021-06-25 DIAGNOSIS — H353131 Nonexudative age-related macular degeneration, bilateral, early dry stage: Secondary | ICD-10-CM | POA: Diagnosis not present

## 2021-06-25 DIAGNOSIS — H35363 Drusen (degenerative) of macula, bilateral: Secondary | ICD-10-CM | POA: Diagnosis not present

## 2021-06-25 DIAGNOSIS — H43813 Vitreous degeneration, bilateral: Secondary | ICD-10-CM | POA: Diagnosis not present

## 2021-06-25 DIAGNOSIS — H0288A Meibomian gland dysfunction right eye, upper and lower eyelids: Secondary | ICD-10-CM | POA: Diagnosis not present

## 2021-06-25 DIAGNOSIS — H524 Presbyopia: Secondary | ICD-10-CM | POA: Diagnosis not present

## 2021-06-25 DIAGNOSIS — H52203 Unspecified astigmatism, bilateral: Secondary | ICD-10-CM | POA: Diagnosis not present

## 2021-06-26 DIAGNOSIS — Z01810 Encounter for preprocedural cardiovascular examination: Secondary | ICD-10-CM | POA: Diagnosis not present

## 2021-08-20 DIAGNOSIS — H259 Unspecified age-related cataract: Secondary | ICD-10-CM | POA: Diagnosis not present

## 2021-08-20 DIAGNOSIS — M810 Age-related osteoporosis without current pathological fracture: Secondary | ICD-10-CM | POA: Diagnosis not present

## 2021-08-20 DIAGNOSIS — K219 Gastro-esophageal reflux disease without esophagitis: Secondary | ICD-10-CM | POA: Diagnosis not present

## 2021-08-20 DIAGNOSIS — G43109 Migraine with aura, not intractable, without status migrainosus: Secondary | ICD-10-CM | POA: Diagnosis not present

## 2021-09-16 IMAGING — MR MR LUMBAR SPINE WO/W CM
7 series · 48 of 48 positions shown · IV contrast (multihance)
Comparison: MRI of the lumbar spine November 07, 2012

CLINICAL DATA: Tethered cord syndrome.

EXAM:
MRI LUMBAR SPINE WITHOUT AND WITH CONTRAST
TECHNIQUE: Multiplanar and multiecho pulse sequences of the lumbar spine were
obtained without and with intravenous contrast.
CONTRAST:  11mL MULTIHANCE GADOBENATE DIMEGLUMINE 529 MG/ML IV SOLN

[Series 2: tirm sag · sagittal · 4.0mm · 0.55mm/px · 3 of 13 slices shown]
[im 1/13]
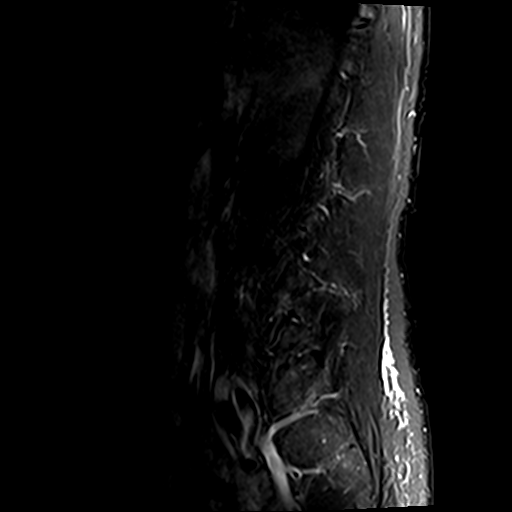
[im 7/13]
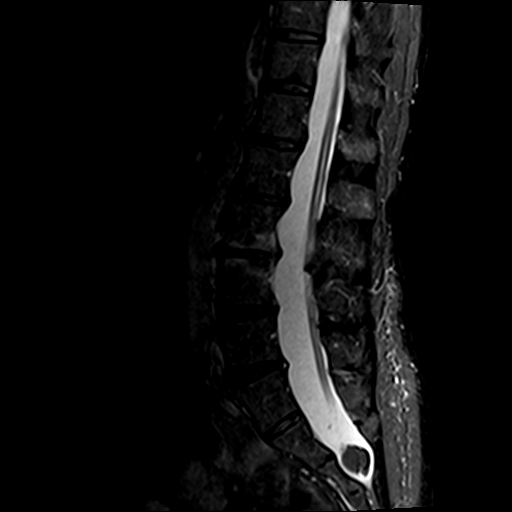
[im 13/13]
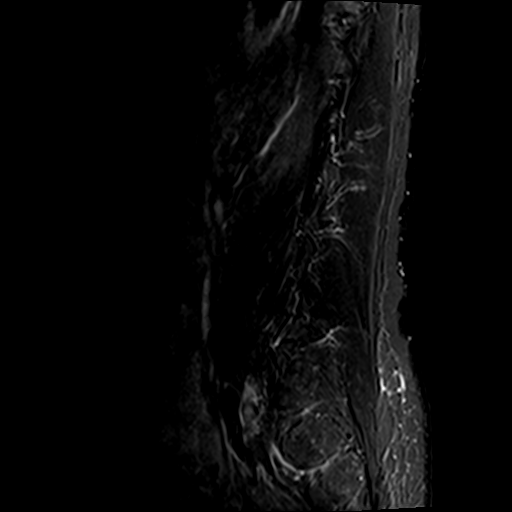

[Series 3: T1 · sagittal · 4.0mm · 0.88mm/px · 4 of 13 slices shown (1 of 2)]
[im 1/13]
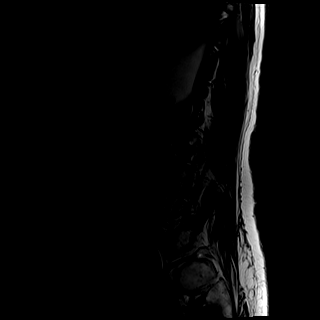
[im 5/13]
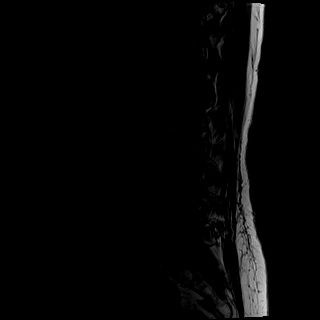
[im 9/13]
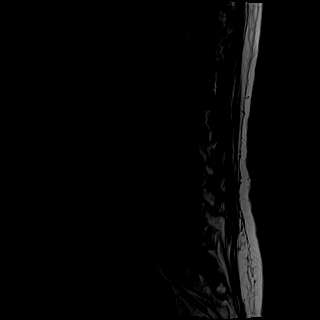
[im 13/13]
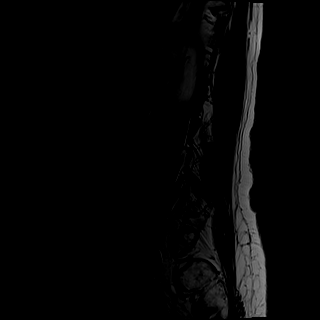

[Series 4: T1 · axial · 4.0mm · 0.78mm/px · z∈[-96,+109]mm · 11 of 42 slices shown (2 of 2)]
[im 1/42]
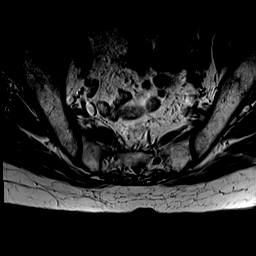
[im 5/42]
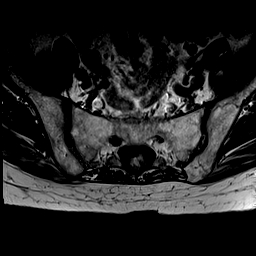
[im 9/42]
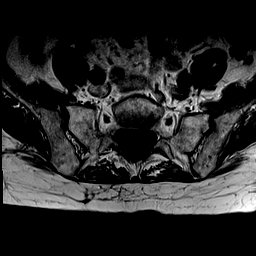
[im 13/42]
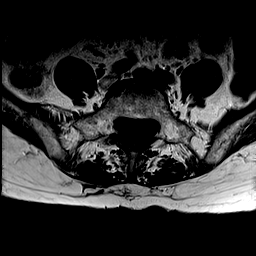
[im 17/42]
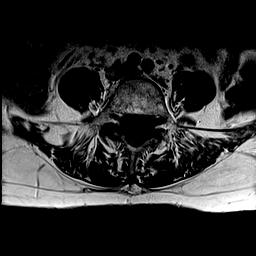
[im 21/42]
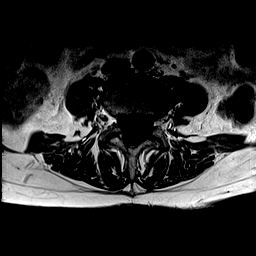
[im 25/42]
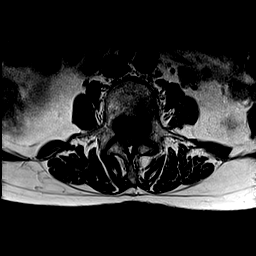
[im 29/42]
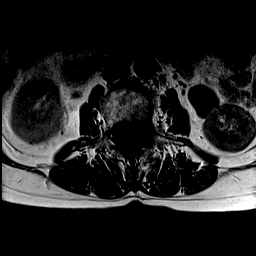
[im 33/42]
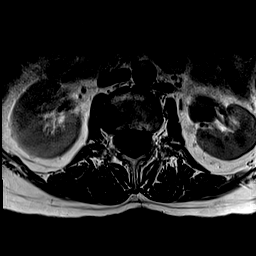
[im 37/42]
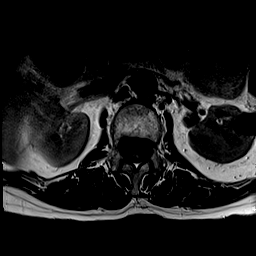
[im 42/42]
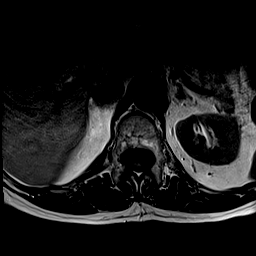

[Series 5: T2 · axial · 4.0mm · 0.78mm/px · z∈[-96,+109]mm · 11 of 42 slices shown]
[im 1/42]
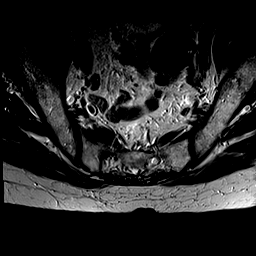
[im 5/42]
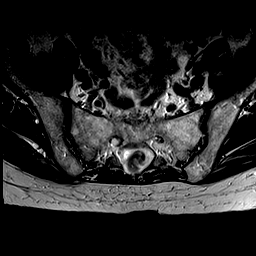
[im 9/42]
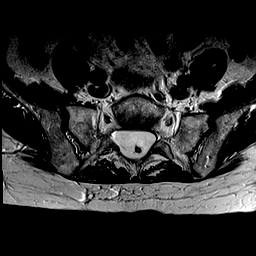
[im 13/42]
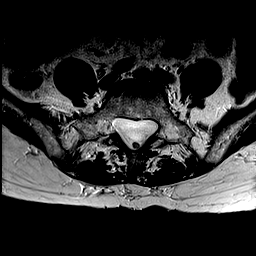
[im 17/42]
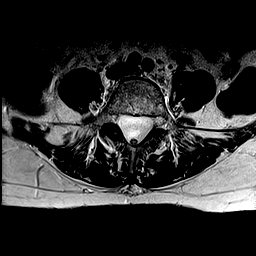
[im 21/42]
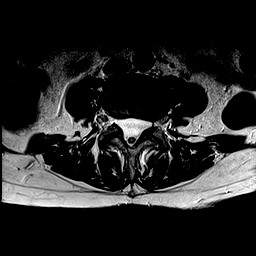
[im 25/42]
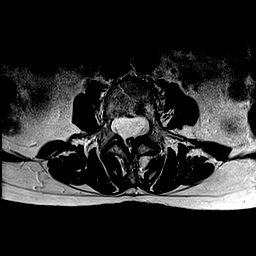
[im 29/42]
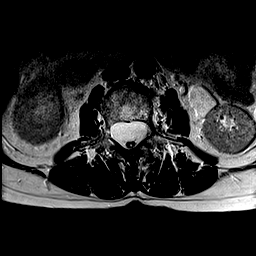
[im 33/42]
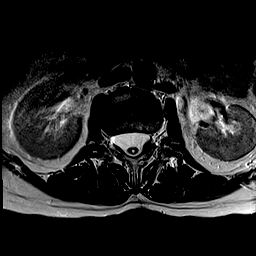
[im 37/42]
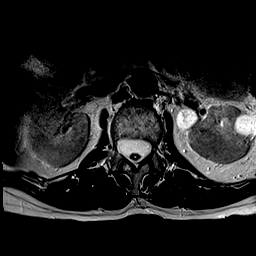
[im 42/42]
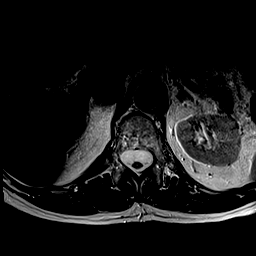

[Series 6: T2 post-contrast · sagittal · 4.0mm · 0.88mm/px · 4 of 13 slices shown]
[im 1/13]
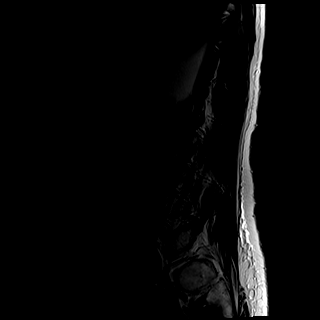
[im 5/13]
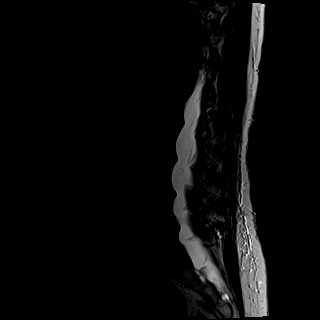
[im 9/13]
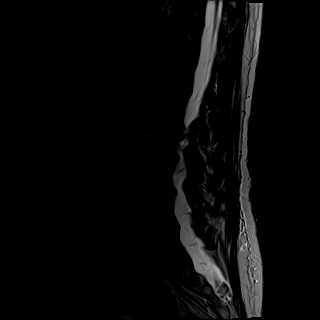
[im 13/13]
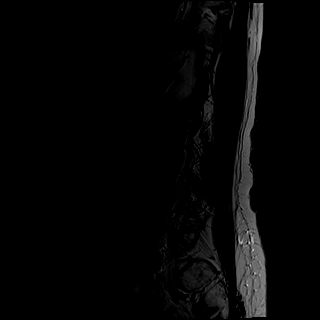

[Series 7: T1 fat-sat post-contrast · sagittal · 4.0mm · 0.88mm/px · 4 of 13 slices shown]
[im 1/13]
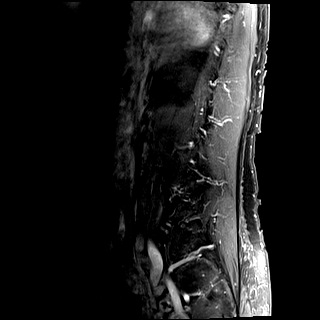
[im 5/13]
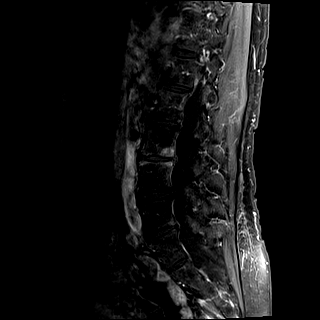
[im 9/13]
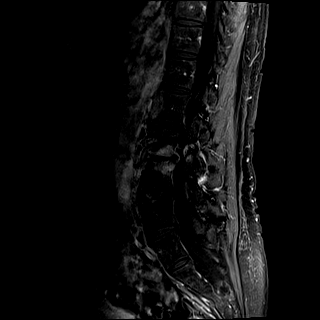
[im 13/13]
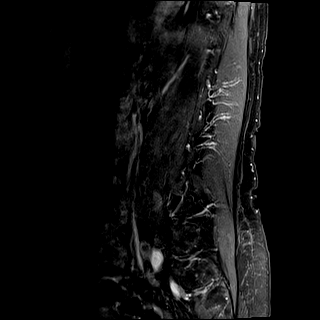

[Series 8: T1 post-contrast · axial · 4.0mm · 0.78mm/px · z∈[-96,+109]mm · 11 of 42 slices shown]
[im 1/42]
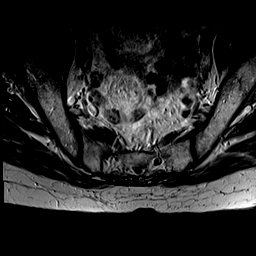
[im 5/42]
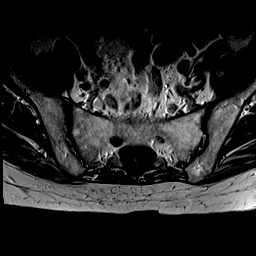
[im 9/42]
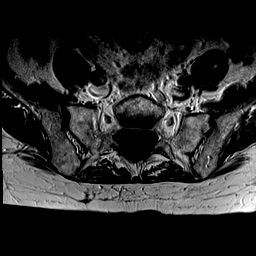
[im 13/42]
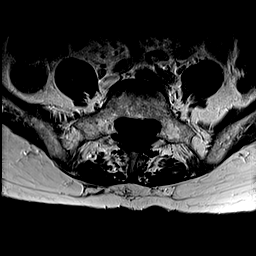
[im 17/42]
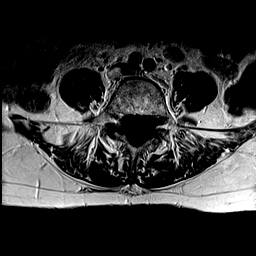
[im 21/42]
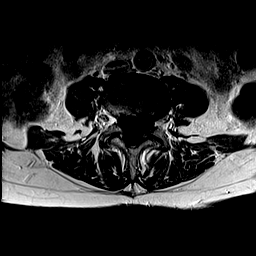
[im 25/42]
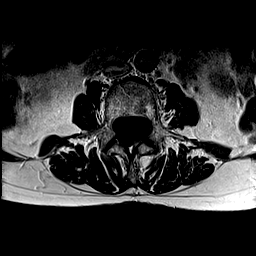
[im 29/42]
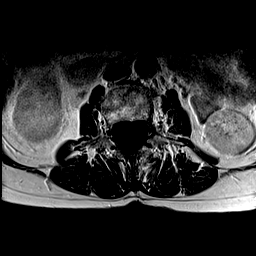
[im 33/42]
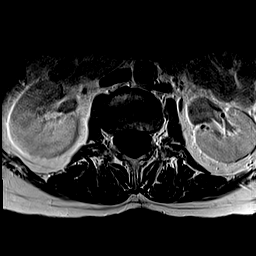
[im 37/42]
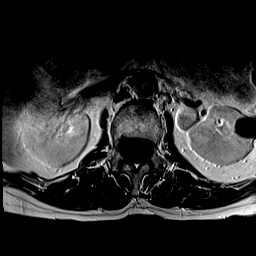
[im 42/42]
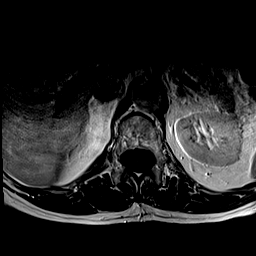

[48 of 48 positions shown; findings below may reference images not displayed]

FINDINGS: Segmentation: A transitional lumbosacral vertebra is assumed to
represent a sacralized L5 with a partially formed disc between L5
and S1. Careful correlation with this numbering strategy prior to
any procedural intervention would be recommended.

Alignment:  Mild dextroconvex scoliosis of the lumbar spine.

Vertebrae: No fracture, evidence of discitis, or bone lesion.
Endplate degenerative changes at L1-2 and L2-3 with contrast
enhancement at L2-3.

Conus medullaris and cauda equina: Tethered cord terminating in a
lipoma at the S2 level is unchanged. The central canal is ectatic,
measuring up to 2.7 mm at the L4 level. Ectasias of the thecal sac
is noted in the lumbosacral region, unchanged.

Paraspinal and other soft tissues: Retroaortic left renal vein. Left
renal cysts.

Disc levels:

T12-L1: Small superiorly migrating disc extrusion without
significant spinal canal or neural foraminal stenosis.

L1-2: Loss of disc height, left asymmetric disc bulge resulting in
mild left neural foraminal narrowing. No spinal canal stenosis.

L2-3: Loss of disc high, disc bulge with associated small left
subarticular disc protrusion, facet degenerative change ligamentum
redundancy result in mild bilateral neural foraminal narrowing. No
significant spinal canal stenosis.

L3-4: Shallow disc bulge and mild facet degenerative changes. No
spinal canal or neural foraminal stenosis.

L4-5: Shallow disc bulge and mild facet degenerative changes. No
spinal canal or neural foraminal stenosis.

L5-S1: No spinal canal or neural foraminal stenosis.
IMPRESSION: 1. Tethered cord terminating in a lipoma at the S2 level, unchanged.
2. Mild multilevel degenerative changes of the lumbar spine without
high-grade spinal canal or neural foraminal stenosis.

## 2021-11-17 DIAGNOSIS — H40013 Open angle with borderline findings, low risk, bilateral: Secondary | ICD-10-CM | POA: Diagnosis not present

## 2021-11-27 ENCOUNTER — Other Ambulatory Visit: Payer: Self-pay | Admitting: Sports Medicine

## 2021-11-27 ENCOUNTER — Other Ambulatory Visit: Payer: Self-pay

## 2021-11-27 ENCOUNTER — Ambulatory Visit
Admission: RE | Admit: 2021-11-27 | Discharge: 2021-11-27 | Disposition: A | Discharge status: Home / Self Care | Payer: PPO | Source: Ambulatory Visit | Attending: Sports Medicine | Admitting: Sports Medicine

## 2021-11-27 DIAGNOSIS — M25551 Pain in right hip: Secondary | ICD-10-CM | POA: Diagnosis not present

## 2021-11-27 DIAGNOSIS — R52 Pain, unspecified: Secondary | ICD-10-CM

## 2021-11-28 DIAGNOSIS — M25551 Pain in right hip: Secondary | ICD-10-CM | POA: Diagnosis not present

## 2021-12-10 DIAGNOSIS — M7061 Trochanteric bursitis, right hip: Secondary | ICD-10-CM | POA: Diagnosis not present

## 2021-12-10 DIAGNOSIS — M25551 Pain in right hip: Secondary | ICD-10-CM | POA: Diagnosis not present

## 2021-12-17 DIAGNOSIS — M25551 Pain in right hip: Secondary | ICD-10-CM | POA: Diagnosis not present

## 2021-12-17 DIAGNOSIS — M7061 Trochanteric bursitis, right hip: Secondary | ICD-10-CM | POA: Diagnosis not present

## 2021-12-22 DIAGNOSIS — M25551 Pain in right hip: Secondary | ICD-10-CM | POA: Diagnosis not present

## 2021-12-22 DIAGNOSIS — M7061 Trochanteric bursitis, right hip: Secondary | ICD-10-CM | POA: Diagnosis not present

## 2021-12-24 DIAGNOSIS — D2272 Melanocytic nevi of left lower limb, including hip: Secondary | ICD-10-CM | POA: Diagnosis not present

## 2021-12-24 DIAGNOSIS — L820 Inflamed seborrheic keratosis: Secondary | ICD-10-CM | POA: Diagnosis not present

## 2021-12-24 DIAGNOSIS — L821 Other seborrheic keratosis: Secondary | ICD-10-CM | POA: Diagnosis not present

## 2021-12-24 DIAGNOSIS — B351 Tinea unguium: Secondary | ICD-10-CM | POA: Diagnosis not present

## 2021-12-24 DIAGNOSIS — Z85828 Personal history of other malignant neoplasm of skin: Secondary | ICD-10-CM | POA: Diagnosis not present

## 2021-12-24 DIAGNOSIS — D225 Melanocytic nevi of trunk: Secondary | ICD-10-CM | POA: Diagnosis not present

## 2021-12-24 DIAGNOSIS — D1801 Hemangioma of skin and subcutaneous tissue: Secondary | ICD-10-CM | POA: Diagnosis not present

## 2021-12-26 DIAGNOSIS — M25551 Pain in right hip: Secondary | ICD-10-CM | POA: Diagnosis not present

## 2021-12-30 DIAGNOSIS — M7061 Trochanteric bursitis, right hip: Secondary | ICD-10-CM | POA: Diagnosis not present

## 2021-12-30 DIAGNOSIS — M25551 Pain in right hip: Secondary | ICD-10-CM | POA: Diagnosis not present

## 2022-01-05 DIAGNOSIS — M25551 Pain in right hip: Secondary | ICD-10-CM | POA: Diagnosis not present

## 2022-01-05 DIAGNOSIS — M7061 Trochanteric bursitis, right hip: Secondary | ICD-10-CM | POA: Diagnosis not present

## 2022-01-15 DIAGNOSIS — M25551 Pain in right hip: Secondary | ICD-10-CM | POA: Diagnosis not present

## 2022-01-15 DIAGNOSIS — M7061 Trochanteric bursitis, right hip: Secondary | ICD-10-CM | POA: Diagnosis not present

## 2022-01-22 DIAGNOSIS — M25551 Pain in right hip: Secondary | ICD-10-CM | POA: Diagnosis not present

## 2022-01-22 DIAGNOSIS — M7061 Trochanteric bursitis, right hip: Secondary | ICD-10-CM | POA: Diagnosis not present

## 2022-01-27 DIAGNOSIS — N1330 Unspecified hydronephrosis: Secondary | ICD-10-CM | POA: Diagnosis not present

## 2022-01-27 DIAGNOSIS — N3946 Mixed incontinence: Secondary | ICD-10-CM | POA: Diagnosis not present

## 2022-01-30 DIAGNOSIS — K2900 Acute gastritis without bleeding: Secondary | ICD-10-CM | POA: Diagnosis not present

## 2022-01-30 DIAGNOSIS — D179 Benign lipomatous neoplasm, unspecified: Secondary | ICD-10-CM | POA: Diagnosis not present

## 2022-01-30 DIAGNOSIS — J309 Allergic rhinitis, unspecified: Secondary | ICD-10-CM | POA: Diagnosis not present

## 2022-02-04 DIAGNOSIS — M25551 Pain in right hip: Secondary | ICD-10-CM | POA: Diagnosis not present

## 2022-02-04 DIAGNOSIS — M7061 Trochanteric bursitis, right hip: Secondary | ICD-10-CM | POA: Diagnosis not present

## 2022-02-09 DIAGNOSIS — M7061 Trochanteric bursitis, right hip: Secondary | ICD-10-CM | POA: Diagnosis not present

## 2022-02-09 DIAGNOSIS — M25551 Pain in right hip: Secondary | ICD-10-CM | POA: Diagnosis not present

## 2022-02-10 ENCOUNTER — Other Ambulatory Visit: Payer: Self-pay | Admitting: Internal Medicine

## 2022-02-11 ENCOUNTER — Encounter: Payer: Self-pay | Admitting: Internal Medicine

## 2022-02-11 ENCOUNTER — Ambulatory Visit: Payer: PPO | Admitting: Internal Medicine

## 2022-02-11 VITALS — BP 110/60 | HR 74 | Temp 97.9°F | Ht 63.0 in | Wt 120.0 lb

## 2022-02-11 DIAGNOSIS — J310 Chronic rhinitis: Secondary | ICD-10-CM

## 2022-02-11 DIAGNOSIS — K219 Gastro-esophageal reflux disease without esophagitis: Secondary | ICD-10-CM | POA: Diagnosis not present

## 2022-02-11 MED ORDER — MONTELUKAST SODIUM 10 MG PO TABS: 10.0000 mg | ORAL_TABLET | Freq: Every day | ORAL | 3 refills | Status: DC

## 2022-02-11 NOTE — Progress Notes (Signed)
? ?      ?Regina [name]    [id number]    1941-08-27 ? ?Primary Care Physician:McNeill, Abigail Butts, MD ?Date of Appointment: 02/11/2022 ?Established Patient Visit ? ?Chief complaint:   ?Chief Complaint  ?Patient presents with  ? Follow-up  ?  Overdue f/u.  Needs singular refilled.  ? ? ? ?HPI: ?Regina [name] is a 81 y.o. woman with chronic cough related to rhinitis.  ? ?Interval Updates: ?Here for one year follow up.  ?Montelukast did help her cough but did not completely take it away.  ? ?She went to [employer] and felt the drier air quality helped her condition. She also stopped taking her omeprazole while she was there but was not eating breakfast and lunch and felt her coughing was much improved.  ? ?She continues to have coughing fits after eating and right when she gets in bed.  ? ?Consistency with omeprazole has been helping and improving her symptoms. ?She is also taking claritin with montelukast.   ? ?I have reviewed the patient's family social and past medical history and updated as appropriate.  ? ?Past Medical History:  ?Diagnosis Date  ? Headache   ? Nervous system disorder   ? spinal cord tethered  ? Osteoporosis   ? ? ?Past Surgical History:  ?Procedure Laterality Date  ? PARTIAL HYSTERECTOMY  1996  ? TUBAL LIGATION    ? ? ?Family History  ?Problem Relation Age of Onset  ? Asthma Mother   ? COPD Mother   ? Stroke Mother   ?     TIA  ? Heart attack Maternal Grandmother   ? Heart attack Maternal Grandfather   ? Cancer Paternal Grandmother   ? Migraines Neg Hx   ? ? ?Social History  ? ?Occupational History  ? Occupation: Self employed  ?Tobacco Use  ? Smoking status: Former  ?  Packs/day: 3.00  ?  Years: 4.00  ?  Pack years: 12.00  ?  Types: Cigarettes  ?  Quit date: 12/10/1972  ?  Years since quitting: 49.2  ? Smokeless tobacco: Never  ?Substance and Sexual Activity  ? Alcohol use: Yes  ?  Alcohol/week: 0.0 standard drinks  ?  Comment: 2-3 glasses wine/week  ? Drug use: No  ? Sexual activity: Not on  file  ? ? ? ?Physical Exam: ?Blood pressure 110/60, pulse 74, temperature 97.9 ?F (36.6 ?C), temperature source Oral, height '5\' 3"'$  (1.6 m), weight 120 lb (54.4 kg), last menstrual period 08/01/2014, SpO2 98 %. ? ?Gen:      No acute distress ?ENT:  +cobblestoning in oropharynx,no nasal polyps, mucus membranes moist ?Lungs:    No increased respiratory effort, symmetric chest wall excursion, clear to auscultation bilaterally, no wheezes or crackles ?CV:         Regular rate and rhythm; no murmurs, rubs, or gallops.  No pedal edema ? ? ?Data Reviewed: ?Imaging: ?I have personally reviewed the chest xray June 2015 - note acute process ? ?PFTs: ? ? ?  Latest Ref Rng & Units 06/05/2014  ?  9:59 AM  ?PFT Results  ?FVC-Pre L 2.32  P  ?FVC-Predicted Pre % 81  P  ?FVC-Post L 2.58  P  ?FVC-Predicted Post % 90  P  ?Pre FEV1/FVC % % 79  P  ?Post FEV1/FCV % % 81  P  ?FEV1-Pre L 1.82  P  ?FEV1-Predicted Pre % 84  P  ?FEV1-Post L 2.09  P  ?DLCO uncorrected ml/min/mmHg 17.68  P  ?  DLCO UNC% % 77  P  ?DLVA Predicted % 96  P  ?TLC L 4.27  P  ?TLC % Predicted % 87  P  ?RV % Predicted % 84  P  ?  ?P Preliminary result  ? ?I have personally reviewed the patient's PFTs and normal pft ? ?Labs: ? ?Immunization status: ?Immunization History  ?Administered Date(s) Administered  ? PFIZER Comirnaty(Gray Top)Covid-19 Tri-Sucrose Vaccine 05/09/2021  ? PFIZER(Purple Top)SARS-COV-2 Vaccination 12/02/2019, 12/26/2019, 08/25/2020  ? ? ?External Records Personally Reviewed: pcp ? ?Assessment:  ?Chronic cough, multifactorial  ?GERD and UACS from rhinitis ? ? ?Plan/Recommendations: ?Her main issue is adherence with regimen for rhinitis and gerd. We discussed extensively how these two interplay and how cough from rhinitis worsens reflux.  ?Recommend follow up with gastroenterology for reflux, need for endoscopy.  ? ?Continue PPI ?Reflux modifications ?Continue montelukast, allegra. ? ?Return to Care: ?Return in about 1 year (around 02/12/2023). ? ? ?Lenice Llamas, MD ?Pulmonary and Critical Care Medicine ?[place] ?Office:[phone] ? ? ? ? ? ?

## 2022-02-11 NOTE — Patient Instructions (Addendum)
Please schedule follow up scheduled with myself in 12 months.  If my schedule is not open yet, we will contact you with a reminder closer to that time. Please call [phone] if you haven't heard from [place] a month before.  ? ?Your cough is a combination of reflux and chronic rhitnis. You need to treat both to feel better.  ? ?Keep on loratidine once daily ?Continue montelukast for rhinitis.  ?Continue nexium ? ?Before your next visit I would like you to have: ?Referral to GI doctor. We will call you to schedule this appointment for you.  ? ?What is GERD? ?Gastroesophageal reflux disease (GERD) is gastroesophageal reflux diseasewhich occurs when the lower esophageal sphincter (LES) opens spontaneously, for varying periods of time, or does not close properly and stomach contents rise up into the esophagus. GER is also called acid reflux or acid regurgitation, because digestive juices--called acids--rise up with the food. The esophagus is the tube that carries food from the mouth to the stomach. The LES is a ring of muscle at the bottom of the esophagus that acts like a valve between the esophagus and stomach. ? ?When acid reflux occurs, food or fluid can be tasted in the back of the mouth. When refluxed stomach acid touches the lining of the esophagus it may cause a burning sensation in the chest or throat called heartburn or acid indigestion. Occasional reflux is common. Persistent reflux that occurs more than twice a week is considered GERD, and it can eventually lead to more serious health problems. People of all ages can have GERD. Studies have shown that GERD may worsen or contribute to asthma, chronic cough, and pulmonary fibrosis. ? ? ?What are the symptoms of GERD? ?The main symptom of GERD in adults is frequent heartburn, also called acid indigestion--burning-type pain in the lower part of the mid-chest, behind the breast bone, and in the mid-abdomen.  Not all reflux is acidic in nature, and many patients  don't have heart burn at all. Sometimes it feels like a cough (either dry or with mucus), choking sensation, asthma, shortness of breath, waking up at night, frequent throat clearing, or trouble swallowing.  ? ? ?What causes GERD? ?The reason some people develop GERD is still unclear. However, research shows that in people with GERD, the LES relaxes while the rest of the esophagus is working. Anatomical abnormalities such as a hiatal hernia may also contribute to GERD. A hiatal hernia occurs when the upper part of the stomach and the LES move above the diaphragm, the muscle wall that separates the stomach from the chest. Normally, the diaphragm helps the LES keep acid from rising up into the esophagus. When a hiatal hernia is present, acid reflux can occur more easily. A hiatal hernia can occur in people of any age and is most often a normal finding in otherwise healthy people over age 11. Most of the time, a hiatal hernia produces no symptoms.  ? ?Other factors that may contribute to GERD include ?- Obesity or recent weight gain ?- Pregnancy  ?- Smoking  ?- Diet ?- Certain medications ? ?Common foods that can worsen reflux symptoms include: ?- carbonated beverages ?- artificial sweeteners ?- citrus fruits  ?- chocolate  ?- drinks with caffeine or alcohol  ?- fatty and fried foods  ?- garlic and onions  ?- mint flavorings  ?- spicy foods  ?- tomato-based foods, like spaghetti sauce, salsa, chili, and pizza  ? ?Lifestyle Changes ?If you smoke, stop.  ?Avoid foods  and beverages that worsen symptoms (see above.) ?Lose weight if needed.  ?Eat small, frequent meals.  ?Wear loose-fitting clothes.  ?Avoid lying down for 3 hours after a meal.  ?Raise the head of your bed 6 to 8 inches by securing wood blocks under the bedposts. Just using extra pillows will not help, but using a wedge-shaped pillow may be helpful. ? ?Medications ? ?H2 blockers, such as cimetidine (Tagamet HB), famotidine (Pepcid AC), nizatidine (Axid AR),  and ranitidine (Zantac 75), decrease acid production. They are available in prescription strength and over-the-counter strength. These drugs provide short-term relief and are effective for about half of those who have GERD symptoms. ? ?Proton pump inhibitors include omeprazole (Prilosec, Zegerid), lansoprazole (Prevacid), pantoprazole (Protonix), rabeprazole (Aciphex), and esomeprazole (Nexium), which are available by prescription. Prilosec is also available in over-the-counter strength. Proton pump inhibitors are more effective than H2 blockers and can relieve symptoms and heal the esophageal lining in almost everyone who has GERD. ? ?Because drugs work in different ways, combinations of medications may help control symptoms. People who get heartburn after eating may take both antacids and H2 blockers. The antacids work first to neutralize the acid in the stomach, and then the H2 blockers act on acid production. By the time the antacid stops working, the H2 blocker will have stopped acid production. Your health care provider is the best source of information about how to use medications for GERD. ? ? ?Points to Remember ?1. You can have GERD without having heartburn. Your symptoms could include a dry cough, asthma symptoms, or trouble swallowing. ? ?2. Taking medications daily as prescribed is important in controlling you symptoms.  Sometimes it can take up to 8 weeks to fully achieve the effects of the medications prescribed. ? ?3. Coughing related to GERD can be difficult to treat and is very frustrating!  However, it is important to stick with these medications and lifestyle modifications before pursuing more aggressive or invasive test and treatments. ? ? ?

## 2022-02-12 DIAGNOSIS — M25552 Pain in left hip: Secondary | ICD-10-CM | POA: Diagnosis not present

## 2022-02-19 DIAGNOSIS — M7061 Trochanteric bursitis, right hip: Secondary | ICD-10-CM | POA: Diagnosis not present

## 2022-02-19 DIAGNOSIS — M25551 Pain in right hip: Secondary | ICD-10-CM | POA: Diagnosis not present

## 2022-02-24 DIAGNOSIS — M25551 Pain in right hip: Secondary | ICD-10-CM | POA: Diagnosis not present

## 2022-02-24 DIAGNOSIS — M7061 Trochanteric bursitis, right hip: Secondary | ICD-10-CM | POA: Diagnosis not present

## 2022-03-03 DIAGNOSIS — M7061 Trochanteric bursitis, right hip: Secondary | ICD-10-CM | POA: Diagnosis not present

## 2022-03-03 DIAGNOSIS — M25551 Pain in right hip: Secondary | ICD-10-CM | POA: Diagnosis not present

## 2022-03-05 DIAGNOSIS — M25552 Pain in left hip: Secondary | ICD-10-CM | POA: Diagnosis not present

## 2022-03-05 DIAGNOSIS — M25551 Pain in right hip: Secondary | ICD-10-CM | POA: Diagnosis not present

## 2022-03-05 DIAGNOSIS — Q068 Other specified congenital malformations of spinal cord: Secondary | ICD-10-CM | POA: Diagnosis not present

## 2022-03-06 DIAGNOSIS — N319 Neuromuscular dysfunction of bladder, unspecified: Secondary | ICD-10-CM | POA: Diagnosis not present

## 2022-03-16 ENCOUNTER — Ambulatory Visit: Payer: PPO | Attending: Sports Medicine | Admitting: Physical Therapy

## 2022-03-16 ENCOUNTER — Encounter: Payer: Self-pay | Admitting: Physical Therapy

## 2022-03-16 ENCOUNTER — Other Ambulatory Visit: Payer: Self-pay

## 2022-03-16 DIAGNOSIS — M5459 Other low back pain: Secondary | ICD-10-CM

## 2022-03-16 DIAGNOSIS — M6281 Muscle weakness (generalized): Secondary | ICD-10-CM | POA: Diagnosis not present

## 2022-03-16 NOTE — Patient Instructions (Signed)
Access Code: [email or web address] URL: [email or web address] Date: 03/16/2022 Prepared by: [facility]  Exercises - Hooklying Single Knee to Chest Stretch  - 1 x daily - 7 x weekly - 1 sets - 3-5 reps - Supine Posterior Pelvic Tilt  - 1 x daily - 7 x weekly - 1-2 sets - 5-10 reps

## 2022-03-16 NOTE — Therapy (Signed)
OUTPATIENT PHYSICAL THERAPY NEURO EVALUATION   Patient Name: Regina [name] MRN: [id number] DOB:Dec 07, 1940, 81 y.o., female Today's Date: 03/17/2022   PCP:  Cari Caraway, MD REFERRING PROVIDER: Inez Catalina, MD   PT End of Session - 03/17/22 [phone]     Visit Number 1    Number of Visits 13    Date for PT Re-Evaluation 04/28/22    Authorization Type HTA    Progress Note Due on Visit 10    PT Start Time 1315    PT Stop Time 1410    PT Time Calculation (min) 55 min    Activity Tolerance Patient limited by pain   muscle spasms in buttocks with testing   Behavior During Therapy [facility] for tasks assessed/performed             Past Medical History:  Diagnosis Date   Headache    Nervous system disorder    spinal cord tethered   Osteoporosis    Past Surgical History:  Procedure Laterality Date   PARTIAL HYSTERECTOMY  1996   TUBAL LIGATION     Patient Active Problem List   Diagnosis Date Noted   Left buttock pain 11/14/2020   Myofascial pain 11/14/2020   Piriformis syndrome of left side 05/07/2020   Family history of glaucoma 03/29/2019   Meibomian gland dysfunction (MGD) of both eyes 03/29/2019   Vitreous syneresis of both eyes 03/29/2019   Open angle with borderline findings and low glaucoma risk in both eyes 03/02/2018   Dysphonia 01/03/2018   Post-nasal drainage 01/03/2018   Heterozygous factor V Leiden mutation ([place]) 05/28/2016   Amaurosis fugax of left eye 05/28/2016   Migraine aura without headache 04/13/2016   Dermatochalasis of both upper eyelids 08/01/2015   Asthma, mild intermittent 06/05/2014   Cough 03/20/2014   Lipoma 02/22/2013    ONSET DATE: 03/05/2022 MD referral  REFERRING DIAG: Q06.8 (ICD-10-CM) - Other specified congenital malformations of spinal cord  THERAPY DIAG:  Other low back pain  Muscle weakness (generalized)  Rationale for Evaluation and Treatment Rehabilitation  SUBJECTIVE:                                                                                                                                                                                               SUBJECTIVE STATEMENT: "Everybody else has given up on me."  Pain comes on only at night.  Pain only comes on when lying down.  Only able to sleep about   Been going on for some time.    Pain now is at upper buttocks.  Was seeing a different PT and it was actually  making it worse.  Whole left side is compromised, from tethered cord found 27 years ago.  Feels that it is worsening, with R side pain radiation into mid-posterior thigh. Pt accompanied by: self  PERTINENT HISTORY: hx of piriformis pain, myofascial pain, migraine, asthma, lipoma at base of spine  PAIN:  Are you having pain? No pain at eval.  Pain is only at night.  Yes: NPRS scale: 7-8/10 Pain location: bilateral buttocks Pain description: achy Aggravating factors: sleeping Relieving factors: standing, walking  PRECAUTIONS: None  WEIGHT BEARING RESTRICTIONS No  FALLS: Has patient fallen in last 6 months? No  LIVING ENVIRONMENT: Lives with: lives with their spouse Lives in: House/apartment Stairs:  TBA Enjoys Tai-chi-does this 2x/wk PLOF: Independent  PATIENT GOALS A good night's sleep-to avoid medications, if possible.  OBJECTIVE:   DIAGNOSTIC FINDINGS: MRI 2021 lumbar spine  COGNITION: Overall cognitive status: Within functional limits for tasks assessed   SENSATION: Numbness in R distal/lateral thigh   MUSCLE LENGTH: Hamstrings: Right -20 deg from neutral; Left -12 deg from neutral SLR test:  Right 80 degrees with pain; Left 90 degrees  POSTURE: anterior pelvic tilt and appears to have some hypermobility through spine/pelvis in supine  LOWER EXTREMITY ROM:     Active  Right Eval Left Eval  Hip flexion    Hip extension    Hip abduction    Hip adduction    Hip internal rotation    Hip external rotation    Knee flexion    Knee extension    Ankle dorsiflexion  neutral -3 degrees from neutral  Ankle plantarflexion    Ankle inversion    Ankle eversion     (Blank rows = not tested)  LOWER EXTREMITY MMT:    MMT Right Eval Left Eval  Hip flexion 4+ 4  Hip extension    Hip abduction    Hip adduction    Hip internal rotation    Hip external rotation    Knee flexion 4+ 4  Knee extension 5 4+  Ankle dorsiflexion 4 4  Ankle plantarflexion    Ankle inversion    Ankle eversion    (Blank rows = not tested)  TRANSFERS: Assistive device utilized: None  Sit to stand: Complete Independence Stand to sit: Complete Independence   FUNCTIONAL TESTs:  PALPATION:   Pt tender to palpation along PSIS bilaterally.  Pt tender at piriformis area. FUNCTIONAL MOVEMENTS in supine:  pelvic tilt: posterior-no increase in pain, anterior-"pulling in back"; single knee to chest-no increase in pain   TODAY'S TREATMENT:  Initiated HEP-see below   PATIENT EDUCATION: Education details: PT eval results, POC, initiated HEP; initially discussed use of ice massage, need to "calm down" the PSIS/buttock area and allow for core stability, painfree mobility/positioning in sleeping Person educated: Patient Education method: Explanation, Demonstration, and Handouts Education comprehension: verbalized understanding and returned demonstration   HOME EXERCISE PROGRAM: Access Code: [email or web address] URL: [email or web address] Date: 03/16/2022 Prepared by: [facility]  Exercises - Hooklying Single Knee to Chest Stretch  - 1 x daily - 7 x weekly - 1 sets - 3-5 reps - Supine Posterior Pelvic Tilt  - 1 x daily - 7 x weekly - 1-2 sets - 5-10 reps    GOALS: Goals reviewed with patient? Yes  SHORT TERM GOALS: Target date: 04/14/2022  Pt will be independent with HEP for improved core stability, decreased pain during sleeping hours. Baseline: Goal status: INITIAL  2.  Pt will report decrease  in pain by at least 50% during night  time hours. Baseline: Reports 7-8/10 Goal status: INITIAL   LONG TERM GOALS: Target date: 04/28/2022  Pt will be independent with HEP for improved core stability, lower extremity strength, and sleep positioning and decreased pain. Baseline:  Goal status: INITIAL  2.  Pt will report decrease in pain by at least 75% during night time hours. Baseline:  Goal status: INITIAL  3.  Pt will verbalize understanding of ways to manage/control low back pain and positioning/body mechanics. Baseline:  Goal status: INITIAL   ASSESSMENT:  CLINICAL IMPRESSION: Patient is a 80 y.o. female who was seen today for physical therapy evaluation and treatment for tethered cord syndrome.  She reports 27 year history of LLE weakness due to tethered cord, but in the past year, she is noting increased bilateral buttock, low back pain and pain radiating into RLE.  Pain is primarily worse during nighttime hours in sleeping positions, which limits her sleep.  She will benefit from skilled PT to address core stability, flexibility, strengthening for improved functional mobility, sleeping, and decreased pain.    OBJECTIVE IMPAIRMENTS decreased ROM, increased muscle spasms, impaired flexibility, and pain.   ACTIVITY LIMITATIONS sleeping and bed mobility  PARTICIPATION LIMITATIONS: community activity  PERSONAL FACTORS hx of piriformis pain, myofascial pain, migraine, asthma, lipoma at base of spine are also affecting patient's functional outcome.   REHAB POTENTIAL: Good  CLINICAL DECISION MAKING: Evolving/moderate complexity  EVALUATION COMPLEXITY: Moderate  PLAN: PT FREQUENCY: 2x/week  PT DURATION: 6 weeks plus eval visit = 7 weeks total POC  PLANNED INTERVENTIONS: Therapeutic exercises, Therapeutic activity, Neuromuscular re-education, Balance training, Gait training, Patient/Family education, Joint mobilization, Aquatic Therapy, Electrical stimulation, Cryotherapy, Moist heat, Ultrasound, and Manual  therapy  PLAN FOR NEXT SESSION: Ask about how HEP went; work on gentle core stability-finding/maintaining lumbar spine neutral position.  Educate on ice massage to buttock, piriformis area.  Pt is already doing some piriformis stretching-make sure she is not stretching into painful range.   Desta Bujak W., PT 03/17/2022, 10:06 AM

## 2022-03-20 DIAGNOSIS — H40013 Open angle with borderline findings, low risk, bilateral: Secondary | ICD-10-CM | POA: Diagnosis not present

## 2022-03-20 DIAGNOSIS — H35363 Drusen (degenerative) of macula, bilateral: Secondary | ICD-10-CM | POA: Diagnosis not present

## 2022-03-23 ENCOUNTER — Encounter: Payer: Self-pay | Admitting: Physical Therapy

## 2022-03-23 ENCOUNTER — Ambulatory Visit: Payer: PPO | Admitting: Physical Therapy

## 2022-03-23 DIAGNOSIS — M6281 Muscle weakness (generalized): Secondary | ICD-10-CM

## 2022-03-23 DIAGNOSIS — M5459 Other low back pain: Secondary | ICD-10-CM | POA: Diagnosis not present

## 2022-03-23 NOTE — Patient Instructions (Signed)
Access Code: [email or web address] URL: [email or web address] Date: 03/23/2022 Prepared by: [facility]  Exercises - Standing Lumbar Spine Flexion Stretch Counter  - 1 x daily - 7 x weekly - 1 sets - 5-10 reps

## 2022-03-23 NOTE — Therapy (Signed)
OUTPATIENT PHYSICAL THERAPY TREATMENT NOTE   Patient Name: Regina [name] MRN: [id number] DOB:October 27, 1940, 81 y.o., female Today's Date: 03/23/2022  PCP: Cari Caraway, MD REFERRING PROVIDER: Inez Catalina, MD  END OF SESSION:   PT End of Session - 03/23/22 1737     Visit Number 2    Number of Visits 13    Date for PT Re-Evaluation 04/28/22    Authorization Type HTA    Progress Note Due on Visit 10    PT Start Time 4259    PT Stop Time 5638    PT Time Calculation (min) 48 min    Activity Tolerance Patient tolerated treatment well    Behavior During Therapy WFL for tasks assessed/performed             Past Medical History:  Diagnosis Date   Headache    Nervous system disorder    spinal cord tethered   Osteoporosis    Past Surgical History:  Procedure Laterality Date   PARTIAL HYSTERECTOMY  1996   TUBAL LIGATION     Patient Active Problem List   Diagnosis Date Noted   Left buttock pain 11/14/2020   Myofascial pain 11/14/2020   Piriformis syndrome of left side 05/07/2020   Family history of glaucoma 03/29/2019   Meibomian gland dysfunction (MGD) of both eyes 03/29/2019   Vitreous syneresis of both eyes 03/29/2019   Open angle with borderline findings and low glaucoma risk in both eyes 03/02/2018   Dysphonia 01/03/2018   Post-nasal drainage 01/03/2018   Heterozygous factor V Leiden mutation ([place]) 05/28/2016   Amaurosis fugax of left eye 05/28/2016   Migraine aura without headache 04/13/2016   Dermatochalasis of both upper eyelids 08/01/2015   Asthma, mild intermittent 06/05/2014   Cough 03/20/2014   Lipoma 02/22/2013    REFERRING DIAG: Q06.8 (ICD-10-CM) - Other specified congenital malformations of spinal cord  THERAPY DIAG:  Other low back pain  Muscle weakness (generalized)  Rationale for Evaluation and Treatment Rehabilitation  PERTINENT HISTORY:  hx of piriformis pain, myofascial pain, migraine, asthma, lipoma at base of spine  PRECAUTIONS:  none  SUBJECTIVE: Pain today-started my day by taking tylenol at 6 am.    PAIN:  Are you having pain? No Only have pain at night, early in morning   OBJECTIVE:   TODAY'S TREATMENT: 03/23/2022 Activity Comments  Abdominal activation in supine, finding neutral spine position Tactile and verbal cues  Abdominal activation with hooklying marching, x 3 reps, 3 sets   Supine leg lengthener, each leg x 5 reps Cues for technique  Supine leg press, each leg x 5 reps Cues for technique  Supine R gastroc/hamstring stretch with gait belt, 2 x 10 sec Reports feeling good stretch     Standing stretches:  pt demo standing gastroc (runner's stretch); performs wide BOS anterior/posterior weightshifting to stretch hamstrings/ low back, hip flexors/gastrocs No c/o pain with stretches      PATIENT EDUCATION: Education details: Discussed rationale for changing exercises (in research/reading about tethered cord syndrome, it cautions against repeated lumbar flexion, which stretches spinal cord causing pain); discussed rationale for core stability, finding neutral spine in varied positions.  Updated HEP-see below Person educated: Patient Education method: Explanation, Demonstration, Verbal cues, and Handouts Education comprehension: verbalized understanding, returned demonstration, and needs further education    Access Code: [email or web address] URL: [email or web address] Date: 03/23/2022 Prepared by: [facility]  Exercises - Standing Lumbar Spine Flexion Stretch Counter  - 1 x daily -  7 x weekly - 1 sets - 5-10 reps Also provided handouts from decompression exercises:  leg lengthener, 5 reps 1-2 sets/ 2x/day, leg press, 5 reps, 1-2 sets/2x/day (in supine) ____________________________________________________________________ (objective measures completed at initial evaluation unless otherwise dated)  From eval 03/16/2022:     DIAGNOSTIC FINDINGS: MRI 2021 lumbar  spine   COGNITION: Overall cognitive status: Within functional limits for tasks assessed             SENSATION: Numbness in R distal/lateral thigh     MUSCLE LENGTH: Hamstrings: Right -20 deg from neutral; Left -12 deg from neutral SLR test:  Right 80 degrees with pain; Left 90 degrees   POSTURE: anterior pelvic tilt and appears to have some hypermobility through spine/pelvis in supine   LOWER EXTREMITY ROM:      Active  Right Eval Left Eval  Hip flexion      Hip extension      Hip abduction      Hip adduction      Hip internal rotation      Hip external rotation      Knee flexion      Knee extension      Ankle dorsiflexion neutral -3 degrees from neutral  Ankle plantarflexion      Ankle inversion      Ankle eversion       (Blank rows = not tested)   LOWER EXTREMITY MMT:     MMT Right Eval Left Eval  Hip flexion 4+ 4  Hip extension      Hip abduction      Hip adduction      Hip internal rotation      Hip external rotation      Knee flexion 4+ 4  Knee extension 5 4+  Ankle dorsiflexion 4 4  Ankle plantarflexion      Ankle inversion      Ankle eversion      (Blank rows = not tested)   TRANSFERS: Assistive device utilized: None  Sit to stand: Complete Independence Stand to sit: Complete Independence     FUNCTIONAL TESTs:  PALPATION:   Pt tender to palpation along PSIS bilaterally.  Pt tender at piriformis area. FUNCTIONAL MOVEMENTS in supine:  pelvic tilt: posterior-no increase in pain, anterior-"pulling in back"; single knee to chest-no increase in pain     TODAY'S TREATMENT:  Initiated HEP-see below     PATIENT EDUCATION: Education details: PT eval results, POC, initiated HEP; initially discussed use of ice massage, need to "calm down" the PSIS/buttock area and allow for core stability, painfree mobility/positioning in sleeping Person educated: Patient Education method: Explanation, Demonstration, and Handouts Education comprehension: verbalized  understanding and returned demonstration     HOME EXERCISE PROGRAM: Access Code: [email or web address] URL: [email or web address] Date: 03/16/2022 Prepared by: [facility]   Exercises - Hooklying Single Knee to Chest Stretch  - 1 x daily - 7 x weekly - 1 sets - 3-5 reps - Supine Posterior Pelvic Tilt  - 1 x daily - 7 x weekly - 1-2 sets - 5-10 reps   _______________________________________________________________________    GOALS: Goals reviewed with patient? Yes   SHORT TERM GOALS: Target date: 04/14/2022   Pt will be independent with HEP for improved core stability, decreased pain during sleeping hours. Baseline: Goal status: IN PROGRESS   2.  Pt will report decrease in pain by at least 50% during night time hours. Baseline: Reports 7-8/10 Goal status: IN PROGRESS  LONG TERM GOALS: Target date: 04/28/2022   Pt will be independent with HEP for improved core stability, lower extremity strength, and sleep positioning and decreased pain. Baseline:  Goal status: IN PROGRESS   2.  Pt will report decrease in pain by at least 75% during night time hours. Baseline:  Goal status: IN PROGRESS   3.  Pt will verbalize understanding of ways to manage/control low back pain and positioning/body mechanics. Baseline:  Goal status: IN PROGRESS     ASSESSMENT:   CLINICAL IMPRESSION: Skilled PT session today focused on exercises more neutral spine, core stability based to lessen stretch on spinal cord (as compared to lumbar flexion exercises).  Pt does not have any increase in pain during PT session, only one episode of spasms in L buttocks.  Educated patient in updates to HEP as a way to try to manage pain during the night when she is awakened by pain.  She will continue to benefit from skilled PT to address impairments below to decrease pain and improve functional mobility and sleep tolerance.   OBJECTIVE IMPAIRMENTS decreased ROM, increased  muscle spasms, impaired flexibility, and pain.    ACTIVITY LIMITATIONS sleeping and bed mobility   PARTICIPATION LIMITATIONS: community activity   PERSONAL FACTORS hx of piriformis pain, myofascial pain, migraine, asthma, lipoma at base of spine are also affecting patient's functional outcome.    REHAB POTENTIAL: Good   CLINICAL DECISION MAKING: Evolving/moderate complexity   EVALUATION COMPLEXITY: Moderate   PLAN: PT FREQUENCY: 2x/week   PT DURATION: 6 weeks plus eval visit = 7 weeks total POC   PLANNED INTERVENTIONS: Therapeutic exercises, Therapeutic activity, Neuromuscular re-education, Balance training, Gait training, Patient/Family education, Joint mobilization, Aquatic Therapy, Electrical stimulation, Cryotherapy, Moist heat, Ultrasound, and Manual therapy   PLAN FOR NEXT SESSION: Ask about how HEP went; work on gentle core stability-finding/maintaining lumbar spine neutral position.  Educate on ice massage to buttock, piriformis area.  Pt is already doing some piriformis stretching-make sure she is not stretching into painful range.  Bed mobility/sleep positions     Ysabelle Goodroe W., PT 03/23/2022, 5:49 PM

## 2022-03-26 ENCOUNTER — Encounter: Payer: Self-pay | Admitting: Physical Therapy

## 2022-03-26 ENCOUNTER — Ambulatory Visit: Payer: PPO | Admitting: Physical Therapy

## 2022-03-26 DIAGNOSIS — M6281 Muscle weakness (generalized): Secondary | ICD-10-CM

## 2022-03-26 DIAGNOSIS — M5459 Other low back pain: Secondary | ICD-10-CM | POA: Diagnosis not present

## 2022-03-26 NOTE — Therapy (Signed)
OUTPATIENT PHYSICAL THERAPY TREATMENT NOTE   Patient Name: Regina [name] MRN: [id number] DOB:[birth year], 81 y.o., female Today's Date: 03/26/2022  PCP: Cari Caraway, MD REFERRING PROVIDER: Inez Catalina, MD  END OF SESSION:   PT End of Session - 03/26/22 0933     Visit Number 3    Number of Visits 13    Date for PT Re-Evaluation 04/28/22    Authorization Type HTA    Progress Note Due on Visit 10    PT Start Time 0935    PT Stop Time 1017    PT Time Calculation (min) 42 min    Activity Tolerance Patient tolerated treatment well    Behavior During Therapy [facility] for tasks assessed/performed             Past Medical History:  Diagnosis Date   Headache    Nervous system disorder    spinal cord tethered   Osteoporosis    Past Surgical History:  Procedure Laterality Date   PARTIAL HYSTERECTOMY  1996   TUBAL LIGATION     Patient Active Problem List   Diagnosis Date Noted   Left buttock pain 11/14/2020   Myofascial pain 11/14/2020   Piriformis syndrome of left side 05/07/2020   Family history of glaucoma 03/29/2019   Meibomian gland dysfunction (MGD) of both eyes 03/29/2019   Vitreous syneresis of both eyes 03/29/2019   Open angle with borderline findings and low glaucoma risk in both eyes 03/02/2018   Dysphonia 01/03/2018   Post-nasal drainage 01/03/2018   Heterozygous factor V Leiden mutation ([place]) 05/28/2016   Amaurosis fugax of left eye 05/28/2016   Migraine aura without headache 04/13/2016   Dermatochalasis of both upper eyelids 08/01/2015   Asthma, mild intermittent 06/05/2014   Cough 03/20/2014   Lipoma 02/22/2013    REFERRING DIAG: Q06.8 (ICD-10-CM) - Other specified congenital malformations of spinal cord  THERAPY DIAG:  Other low back pain  Muscle weakness (generalized)  Rationale for Evaluation and Treatment Rehabilitation  PERTINENT HISTORY:  hx of piriformis pain, myofascial pain, migraine, asthma, lipoma at base of spine  PRECAUTIONS:  none  SUBJECTIVE: "Same old, same old".  Been working with pillows to try to keep (running the pillow all the way down my spine)  Had a little more pain in L hip/buttocks, but no pain in R side today.  Did use the ice massage,  PAIN:  PAIN:  Are you having pain? Yes: NPRS scale: 1/10 Pain location: L buttocks Pain description: sore Aggravating factors: sleeping positions Relieving factors: ice, moving around in the day     OBJECTIVE:    TODAY'S TREATMENT: 03/26/2022 Activity Comments  Discussed options for bed mobility and positioning-including pillows and options for bolster Trialed bolster with pt in supine with 2 pillows in PT session-bolster in clinic is too large for patient and produces discomfort in R hip.  She describes discomfort in supine (she typically sleeps propped in reclined position due to reflux)  Review of HEP from last visit: Leg press supine Leg lengthener Standing ant/posterior weightshift Pt return demo understanding  Abdominal activation in supine:  hooklying marching, x 5 reps, 3 sets Hooklying position heelslides, 5 reps x 2 sets Hooklying hip abduction, 5 reps x 2 sets   Reviewed piriformis stretch-figure-4 stretch, 3 reps each side Cues for technique to avoid pain with excess stretch-cues to only find gentle stretch position   Seated abdominal activation in sitting on therapy ball Anterior pelvic tilts>neutral spine Neutral spine position with abdominal activation with  alt UE lifts, bilat UE lifts, UE trunk rotation, alt leg marching, alt marching with opposite arm lifts  Brief reset position between sets of exercises.  Cues for abdominal activation      PATIENT EDUCATION: Education details: Avoid overstretching with piriformis stretch; abdominal setting/activation with functional activities through the day Person educated: Patient Education method: Explanation Education comprehension: verbalized understanding      Access Code: [email or web address] URL:  [email or web address] Date: 03/23/2022 (most recent update) Prepared by: [facility]  Exercises - Standing Lumbar Spine Flexion Stretch Counter  - 1 x daily - 7 x weekly - 1 sets - 5-10 reps Also provided handouts from decompression exercises:  leg lengthener, 5 reps 1-2 sets/ 2x/day, leg press, 5 reps, 1-2 sets/2x/day (in supine) ____________________________________________________________________ (objective measures completed at initial evaluation unless otherwise dated)  From eval 03/16/2022:     DIAGNOSTIC FINDINGS: MRI 2021 lumbar spine   COGNITION: Overall cognitive status: Within functional limits for tasks assessed             SENSATION: Numbness in R distal/lateral thigh     MUSCLE LENGTH: Hamstrings: Right -20 deg from neutral; Left -12 deg from neutral SLR test:  Right 80 degrees with pain; Left 90 degrees   POSTURE: anterior pelvic tilt and appears to have some hypermobility through spine/pelvis in supine   LOWER EXTREMITY ROM:      Active  Right Eval Left Eval  Hip flexion      Hip extension      Hip abduction      Hip adduction      Hip internal rotation      Hip external rotation      Knee flexion      Knee extension      Ankle dorsiflexion neutral -3 degrees from neutral  Ankle plantarflexion      Ankle inversion      Ankle eversion       (Blank rows = not tested)   LOWER EXTREMITY MMT:     MMT Right Eval Left Eval  Hip flexion 4+ 4  Hip extension      Hip abduction      Hip adduction      Hip internal rotation      Hip external rotation      Knee flexion 4+ 4  Knee extension 5 4+  Ankle dorsiflexion 4 4  Ankle plantarflexion      Ankle inversion      Ankle eversion      (Blank rows = not tested)   TRANSFERS: Assistive device utilized: None  Sit to stand: Complete Independence Stand to sit: Complete Independence     FUNCTIONAL TESTs:  PALPATION:   Pt tender to palpation along  PSIS bilaterally.  Pt tender at piriformis area. FUNCTIONAL MOVEMENTS in supine:  pelvic tilt: posterior-no increase in pain, anterior-"pulling in back"; single knee to chest-no increase in pain     TODAY'S TREATMENT:  Initiated HEP-see below     PATIENT EDUCATION: Education details: PT eval results, POC, initiated HEP; initially discussed use of ice massage, need to "calm down" the PSIS/buttock area and allow for core stability, painfree mobility/positioning in sleeping Person educated: Patient Education method: Explanation, Demonstration, and Handouts Education comprehension: verbalized understanding and returned demonstration     HOME EXERCISE PROGRAM: Access Code: [email or web address] URL: [email or web address] Date: 03/16/2022 Prepared by: [facility]   Exercises - Hooklying Single Knee to  Chest Stretch  - 1 x daily - 7 x weekly - 1 sets - 3-5 reps - Supine Posterior Pelvic Tilt  - 1 x daily - 7 x weekly - 1-2 sets - 5-10 reps   _______________________________________________________________________    GOALS: Goals reviewed with patient? Yes   SHORT TERM GOALS: Target date: 04/14/2022   Pt will be independent with HEP for improved core stability, decreased pain during sleeping hours. Baseline: Goal status: IN PROGRESS   2.  Pt will report decrease in pain by at least 50% during night time hours. Baseline: Reports 7-8/10 Goal status: IN PROGRESS     LONG TERM GOALS: Target date: 04/28/2022   Pt will be independent with HEP for improved core stability, lower extremity strength, and sleep positioning and decreased pain. Baseline:  Goal status: IN PROGRESS   2.  Pt will report decrease in pain by at least 75% during night time hours. Baseline:  Goal status: IN PROGRESS   3.  Pt will verbalize understanding of ways to manage/control low back pain and positioning/body mechanics. Baseline:  Goal status: IN PROGRESS      ASSESSMENT:   CLINICAL IMPRESSION: Continued skilled PT session continued to focus on exercises for neutral spine posture and core stability.  Pt challenged with core activation with lower extremity alternating movements on therapy ball.  Discussed continuing current course of her bed positioning, exercises, abdominal setting/stabilization through the day and avoid overstretching to see if there are positive changes in pt's pain during sleeping hours.  Pt does c/o only pain in L buttock today and little to no pain in RLE during exercise today.  OBJECTIVE IMPAIRMENTS decreased ROM, increased muscle spasms, impaired flexibility, and pain.    ACTIVITY LIMITATIONS sleeping and bed mobility   PARTICIPATION LIMITATIONS: community activity   PERSONAL FACTORS hx of piriformis pain, myofascial pain, migraine, asthma, lipoma at base of spine are also affecting patient's functional outcome.    REHAB POTENTIAL: Good   CLINICAL DECISION MAKING: Evolving/moderate complexity   EVALUATION COMPLEXITY: Moderate   PLAN: PT FREQUENCY: 2x/week   PT DURATION: 6 weeks plus eval visit = 7 weeks total POC   PLANNED INTERVENTIONS: Therapeutic exercises, Therapeutic activity, Neuromuscular re-education, Balance training, Gait training, Patient/Family education, Joint mobilization, Aquatic Therapy, Electrical stimulation, Cryotherapy, Moist heat, Ultrasound, and Manual therapy   PLAN FOR NEXT SESSION:  Continue to work on gentle core stability-finding/maintaining lumbar spine neutral position.  Educate on ice massage to buttock, piriformis area.  Pt is already doing some piriformis stretching-make sure she is not stretching into painful range.       Ketara Cavness W., PT 03/26/2022, 10:48 AM

## 2022-04-01 ENCOUNTER — Ambulatory Visit: Payer: PPO | Admitting: Physical Therapy

## 2022-04-03 ENCOUNTER — Ambulatory Visit: Payer: PPO | Admitting: Physical Therapy

## 2022-04-03 ENCOUNTER — Encounter: Payer: Self-pay | Admitting: Physical Therapy

## 2022-04-03 DIAGNOSIS — M5459 Other low back pain: Secondary | ICD-10-CM

## 2022-04-03 DIAGNOSIS — M6281 Muscle weakness (generalized): Secondary | ICD-10-CM

## 2022-04-06 NOTE — Therapy (Signed)
OUTPATIENT PHYSICAL THERAPY TREATMENT NOTE   Patient Name: Regina [name] MRN: [id number] DOB:09/19/1941, 81 y.o., female Today's Date: 04/07/2022  PCP: [name], MD REFERRING PROVIDER: Christena Deem, MD  END OF SESSION:   PT End of Session - 04/07/22 1148     Visit Number 5    Number of Visits 13    Date for PT Re-Evaluation 04/28/22    Authorization Type HTA    Progress Note Due on Visit 10    PT Start Time 1100    PT Stop Time 1144    PT Time Calculation (min) 44 min    Activity Tolerance Patient tolerated treatment well    Behavior During Therapy WFL for tasks assessed/performed              Past Medical History:  Diagnosis Date   Headache    Nervous system disorder    spinal cord tethered   Osteoporosis    Past Surgical History:  Procedure Laterality Date   PARTIAL HYSTERECTOMY  1996   TUBAL LIGATION     Patient Active Problem List   Diagnosis Date Noted   Left buttock pain 11/14/2020   Myofascial pain 11/14/2020   Piriformis syndrome of left side 05/07/2020   Family history of glaucoma 03/29/2019   Meibomian gland dysfunction (MGD) of both eyes 03/29/2019   Vitreous syneresis of both eyes 03/29/2019   Open angle with borderline findings and low glaucoma risk in both eyes 03/02/2018   Dysphonia 01/03/2018   Post-nasal drainage 01/03/2018   Heterozygous factor V Leiden mutation ([facility]) 05/28/2016   Amaurosis fugax of left eye 05/28/2016   Migraine aura without headache 04/13/2016   Dermatochalasis of both upper eyelids 08/01/2015   Asthma, mild intermittent 06/05/2014   Cough 03/20/2014   Lipoma 02/22/2013    REFERRING DIAG: Q06.8 (ICD-10-CM) - Other specified congenital malformations of spinal cord  THERAPY DIAG:  Other low back pain  Muscle weakness (generalized)  Rationale for Evaluation and Treatment Rehabilitation  PERTINENT HISTORY:  hx of piriformis pain, myofascial pain, migraine, asthma, lipoma at base of  spine  PRECAUTIONS: none  SUBJECTIVE: Same old-same old. Denies questions on HEP.   PAIN:  PAIN:  Are you having pain? No: NPRS scale: 0/10 Pain location: L buttocks Pain description: sore Aggravating factors: sleeping positions Relieving factors: ice, moving around in the day     OBJECTIVE:     TODAY'S TREATMENT: 04/07/22 Activity Comments  open book stretch 10x  Cues to perform to tolerance  prone on elbows 10x3" Cues to perform to tolerance  Quadruped alt UE lift 2x10 Cues to maintain neutral spine  Bird dog 2x10 Considerable L hip instability; c/o some wrist discomfort  cat/cow from anterior tilt to neutral spine x10 Good tolerance; cues to maintain elbows straight  Sidelying R/L hip abduction 2x10 Cueing for alignment and to avoid hip flexion; c/o fatigue L>R LE  STS to touch bottom on mat Unable from elevated mat with airex, discontinued   STS with L foot sightly back 2x10 limited eccentric control from elevated mat     PATIENT EDUCATION: Education details: HEP update Person educated: Patient Education method: Explanation, Demonstration, Tactile cues, Verbal cues, and Handouts Education comprehension: verbalized understanding and returned demonstration  HOME EXERCISE PROGRAM Last updated: 04/07/22 Access Code: [email or web address] URL: [email or web address] Date: 04/07/2022 Prepared by: [facility] - Outpatient  Rehab - [facility]  Exercises - Standing Lumbar Spine Flexion Stretch Counter  - 1 x daily - 7 x weekly - 1  sets - 5-10 reps - Bird Dog  - 1 x daily - 5 x weekly - 2 sets - 10 reps - Sidelying Hip Abduction  - 1 x daily - 5 x weekly - 2 sets - 10 reps - Staggered Sit-to-Stand  - 1 x daily - 5 x weekly - 2 sets - 10 reps  ____________________________________________________________________ (objective measures completed at initial evaluation unless otherwise dated)  From eval 03/16/2022:     DIAGNOSTIC FINDINGS: MRI 2021 lumbar spine    COGNITION: Overall cognitive status: Within functional limits for tasks assessed             SENSATION: Numbness in R distal/lateral thigh     MUSCLE LENGTH: Hamstrings: Right -20 deg from neutral; Left -12 deg from neutral SLR test:  Right 80 degrees with pain; Left 90 degrees   POSTURE: anterior pelvic tilt and appears to have some hypermobility through spine/pelvis in supine   LOWER EXTREMITY ROM:      Active  Right Eval Left Eval  Hip flexion      Hip extension      Hip abduction      Hip adduction      Hip internal rotation      Hip external rotation      Knee flexion      Knee extension      Ankle dorsiflexion neutral -3 degrees from neutral  Ankle plantarflexion      Ankle inversion      Ankle eversion       (Blank rows = not tested)   LOWER EXTREMITY MMT:     MMT Right Eval Left Eval  Hip flexion 4+ 4  Hip extension      Hip abduction      Hip adduction      Hip internal rotation      Hip external rotation      Knee flexion 4+ 4  Knee extension 5 4+  Ankle dorsiflexion 4 4  Ankle plantarflexion      Ankle inversion      Ankle eversion      (Blank rows = not tested)   TRANSFERS: Assistive device utilized: None  Sit to stand: Complete Independence Stand to sit: Complete Independence     FUNCTIONAL TESTs:  PALPATION:   Pt tender to palpation along PSIS bilaterally.  Pt tender at piriformis area. FUNCTIONAL MOVEMENTS in supine:  pelvic tilt: posterior-no increase in pain, anterior-"pulling in back"; single knee to chest-no increase in pain     TODAY'S TREATMENT:  Initiated HEP-see below     PATIENT EDUCATION: Education details: PT eval results, POC, initiated HEP; initially discussed use of ice massage, need to "calm down" the PSIS/buttock area and allow for core stability, painfree mobility/positioning in sleeping Person educated: Patient Education method: Explanation, Demonstration, and Handouts Education comprehension: verbalized  understanding and returned demonstration     HOME EXERCISE PROGRAM: Access Code: [email or web address] URL: [email or web address] Date: 03/16/2022 Prepared by: [facility] - Outpatient  Rehab - [facility]   Exercises - Hooklying Single Knee to Chest Stretch  - 1 x daily - 7 x weekly - 1 sets - 3-5 reps - Supine Posterior Pelvic Tilt  - 1 x daily - 7 x weekly - 1-2 sets - 5-10 reps   _______________________________________________________________________    GOALS: Goals reviewed with patient? Yes   SHORT TERM GOALS: Target date: 04/14/2022   Pt will be independent with HEP for improved core stability, decreased pain during sleeping hours. Baseline:  Goal status: IN PROGRESS   2.  Pt will report decrease in pain by at least 50% during night time hours. Baseline: Reports 7-8/10 Goal status: IN PROGRESS     LONG TERM GOALS: Target date: 04/28/2022   Pt will be independent with HEP for improved core stability, lower extremity strength, and sleep positioning and decreased pain. Baseline:  Goal status: IN PROGRESS   2.  Pt will report decrease in pain by at least 75% during night time hours. Baseline:  Goal status: IN PROGRESS   3.  Pt will verbalize understanding of ways to manage/control low back pain and positioning/body mechanics. Baseline:  Goal status: IN PROGRESS     ASSESSMENT:   CLINICAL IMPRESSION: Patient arrived to session without new complaints. Worked on gentle thoracolumbar and lumbopelvic mobility activities within tolerable ranges of motion. Patient performed quadruped core stabilization activities with assistance in achieving and maintaining neutral spine. Particular weakness evident in L hip with these activities, thus worked on hip strengthening in isolation. Patient also mentioned trouble getting up from the floor d/t muscle weakness, thus worked on STS transfers with L LE bias which provided good challenge. Patient did report 1 episode of dizziness upon  rolling L on mat. May assess further next session d/t patient's report of hx of vertigo. No complaints at end of session. Sending re-certification today to include dry needling and CRM.   OBJECTIVE IMPAIRMENTS decreased ROM, increased muscle spasms, impaired flexibility, and pain.    ACTIVITY LIMITATIONS sleeping and bed mobility   PARTICIPATION LIMITATIONS: community activity   PERSONAL FACTORS hx of piriformis pain, myofascial pain, migraine, asthma, lipoma at base of spine are also affecting patient's functional outcome.    REHAB POTENTIAL: Good   CLINICAL DECISION MAKING: Evolving/moderate complexity   EVALUATION COMPLEXITY: Moderate   PLAN: PT FREQUENCY: 2x/week   PT DURATION: 6 weeks plus eval visit = 7 weeks total POC   PLANNED INTERVENTIONS: Therapeutic exercises, Therapeutic activity, Neuromuscular re-education, Balance training, Gait training, Patient/Family education, Joint mobilization, Aquatic Therapy, Electrical stimulation, Cryotherapy, Moist heat, Ultrasound, and Manual therapy, Dry Needling, Canalith Repositioning, Vestibular Rehab   PLAN FOR NEXT SESSION:  Ask if she was able to get/use foam roller.  Consider ultrasound, ionto/dry needling for pain.  (Will need to add dry needling or ionto to POC).  Continue to work on gentle core stability-finding/maintaining lumbar spine neutral position.  Pt is already doing some piriformis stretching-make sure she is not stretching into painful range.       Anette Guarneri, PT, DPT 04/07/22 11:54 AM  [facility] [address] [place], [address] [place], [place] [address] Phone # [phone] Fax # [phone]

## 2022-04-07 ENCOUNTER — Encounter: Payer: Self-pay | Admitting: Physical Therapy

## 2022-04-07 ENCOUNTER — Ambulatory Visit: Payer: PPO | Admitting: Physical Therapy

## 2022-04-07 DIAGNOSIS — M5459 Other low back pain: Secondary | ICD-10-CM

## 2022-04-07 DIAGNOSIS — M6281 Muscle weakness (generalized): Secondary | ICD-10-CM

## 2022-04-07 NOTE — Therapy (Signed)
OUTPATIENT PHYSICAL THERAPY TREATMENT NOTE   Patient Name: Regina [name] MRN: [id number] DOB:1941/07/29, 81 y.o., female Today's Date: 04/09/2022  PCP: Cari Caraway, MD REFERRING PROVIDER: Inez Catalina, MD  END OF SESSION:   PT End of Session - 04/09/22 1242     Visit Number 6    Number of Visits 13    Date for PT Re-Evaluation 04/28/22    Authorization Type HTA    Progress Note Due on Visit 10    PT Start Time 1100    PT Stop Time 1142    PT Time Calculation (min) 42 min    Equipment Utilized During Treatment Gait belt    Activity Tolerance Patient tolerated treatment well    Behavior During Therapy WFL for tasks assessed/performed               Past Medical History:  Diagnosis Date   Headache    Nervous system disorder    spinal cord tethered   Osteoporosis    Past Surgical History:  Procedure Laterality Date   PARTIAL HYSTERECTOMY  1996   TUBAL LIGATION     Patient Active Problem List   Diagnosis Date Noted   Left buttock pain 11/14/2020   Myofascial pain 11/14/2020   Piriformis syndrome of left side 05/07/2020   Family history of glaucoma 03/29/2019   Meibomian gland dysfunction (MGD) of both eyes 03/29/2019   Vitreous syneresis of both eyes 03/29/2019   Open angle with borderline findings and low glaucoma risk in both eyes 03/02/2018   Dysphonia 01/03/2018   Post-nasal drainage 01/03/2018   Heterozygous factor V Leiden mutation ([place]) 05/28/2016   Amaurosis fugax of left eye 05/28/2016   Migraine aura without headache 04/13/2016   Dermatochalasis of both upper eyelids 08/01/2015   Asthma, mild intermittent 06/05/2014   Cough 03/20/2014   Lipoma 02/22/2013    REFERRING DIAG: Q06.8 (ICD-10-CM) - Other specified congenital malformations of spinal cord  THERAPY DIAG:  Other low back pain  Muscle weakness (generalized)  Rationale for Evaluation and Treatment Rehabilitation  PERTINENT HISTORY:  hx of piriformis pain, myofascial pain,  migraine, asthma, lipoma at base of spine  PRECAUTIONS: none  SUBJECTIVE: Open to getting vertigo addressed today.   PAIN:  PAIN:  Are you having pain? No: NPRS scale: 0/10 Pain location: L buttocks Pain description: sore Aggravating factors: sleeping positions Relieving factors: ice, moving around in the day     OBJECTIVE:      TODAY'S TREATMENT: 04/09/22 Activity Comments  R roll test negative  L roll rest  negative  R sidelying test Negative   L sidelying test L upbeating torsional nystagmus lasting ~15 sec  L DH Large amplitude L upbeating torsional nystagmus lasting ~8 sec  L Epley  Tolerated well   L DH negative  STS staggered L foot back 10x Pillow under seat; limited eccentric control; increased difficulty with repeated reps   L step up 4" with slow step back 10x, L step up, L foot back on 6" step 10x Limited eccentric control upon step back  L TKE with red TB 10x3" Cues to avoid hyperextension     PATIENT EDUCATION: Education details: edu on anatomy of inner ear and BPPV Person educated: Patient Education method: Explanation Education comprehension: verbalized understanding   HOME EXERCISE PROGRAM Last updated: 04/07/22 Access Code: [email or web address] URL: [email or web address] Date: 04/07/2022 Prepared by: [facility]  Exercises - Standing Lumbar Spine Flexion Stretch Counter  - 1 x  daily - 7 x weekly - 1 sets - 5-10 reps - Bird Dog  - 1 x daily - 5 x weekly - 2 sets - 10 reps - Sidelying Hip Abduction  - 1 x daily - 5 x weekly - 2 sets - 10 reps - Staggered Sit-to-Stand  - 1 x daily - 5 x weekly - 2 sets - 10 reps  ____________________________________________________________________ (objective measures completed at initial evaluation unless otherwise dated)  From eval 03/16/2022:     DIAGNOSTIC FINDINGS: MRI 2021 lumbar spine   COGNITION: Overall cognitive status: Within functional limits for tasks  assessed             SENSATION: Numbness in R distal/lateral thigh     MUSCLE LENGTH: Hamstrings: Right -20 deg from neutral; Left -12 deg from neutral SLR test:  Right 80 degrees with pain; Left 90 degrees   POSTURE: anterior pelvic tilt and appears to have some hypermobility through spine/pelvis in supine   LOWER EXTREMITY ROM:      Active  Right Eval Left Eval  Hip flexion      Hip extension      Hip abduction      Hip adduction      Hip internal rotation      Hip external rotation      Knee flexion      Knee extension      Ankle dorsiflexion neutral -3 degrees from neutral  Ankle plantarflexion      Ankle inversion      Ankle eversion       (Blank rows = not tested)   LOWER EXTREMITY MMT:     MMT Right Eval Left Eval  Hip flexion 4+ 4  Hip extension      Hip abduction      Hip adduction      Hip internal rotation      Hip external rotation      Knee flexion 4+ 4  Knee extension 5 4+  Ankle dorsiflexion 4 4  Ankle plantarflexion      Ankle inversion      Ankle eversion      (Blank rows = not tested)   TRANSFERS: Assistive device utilized: None  Sit to stand: Complete Independence Stand to sit: Complete Independence     FUNCTIONAL TESTs:  PALPATION:   Pt tender to palpation along PSIS bilaterally.  Pt tender at piriformis area. FUNCTIONAL MOVEMENTS in supine:  pelvic tilt: posterior-no increase in pain, anterior-"pulling in back"; single knee to chest-no increase in pain     TODAY'S TREATMENT:  Initiated HEP-see below     PATIENT EDUCATION: Education details: PT eval results, POC, initiated HEP; initially discussed use of ice massage, need to "calm down" the PSIS/buttock area and allow for core stability, painfree mobility/positioning in sleeping Person educated: Patient Education method: Explanation, Demonstration, and Handouts Education comprehension: verbalized understanding and returned demonstration     HOME EXERCISE PROGRAM: Access  Code: [email or web address] URL: [email or web address] Date: 03/16/2022 Prepared by: [facility]   Exercises - Hooklying Single Knee to Chest Stretch  - 1 x daily - 7 x weekly - 1 sets - 3-5 reps - Supine Posterior Pelvic Tilt  - 1 x daily - 7 x weekly - 1-2 sets - 5-10 reps   _______________________________________________________________________    GOALS: Goals reviewed with patient? Yes   SHORT TERM GOALS: Target date: 04/14/2022   Pt will be independent with HEP for improved core  stability, decreased pain during sleeping hours. Baseline: Goal status: IN PROGRESS   2.  Pt will report decrease in pain by at least 50% during night time hours. Baseline: Reports 7-8/10 Goal status: IN PROGRESS     LONG TERM GOALS: Target date: 04/28/2022   Pt will be independent with HEP for improved core stability, lower extremity strength, and sleep positioning and decreased pain. Baseline:  Goal status: IN PROGRESS   2.  Pt will report decrease in pain by at least 75% during night time hours. Baseline:  Goal status: IN PROGRESS   3.  Pt will verbalize understanding of ways to manage/control low back pain and positioning/body mechanics. Baseline:  Goal status: IN PROGRESS     ASSESSMENT:   CLINICAL IMPRESSION: Patient arrived to session without new complaints. Performed vertigo assessment which indicated L posterior canalithiasis. Patient treated with L Epley, and subsequent testing was negative. Remainder of session focused on L quad strengthening with some hesitancy and difficulty d/t weakness and occasional c/o L knee discomfort. No complaints at end of session.   OBJECTIVE IMPAIRMENTS decreased ROM, increased muscle spasms, impaired flexibility, and pain.    ACTIVITY LIMITATIONS sleeping and bed mobility   PARTICIPATION LIMITATIONS: community activity   PERSONAL FACTORS hx of piriformis pain, myofascial pain, migraine, asthma, lipoma at base  of spine are also affecting patient's functional outcome.    REHAB POTENTIAL: Good   CLINICAL DECISION MAKING: Evolving/moderate complexity   EVALUATION COMPLEXITY: Moderate   PLAN: PT FREQUENCY: 2x/week   PT DURATION: 6 weeks plus eval visit = 7 weeks total POC   PLANNED INTERVENTIONS: Therapeutic exercises, Therapeutic activity, Neuromuscular re-education, Balance training, Gait training, Patient/Family education, Joint mobilization, Aquatic Therapy, Electrical stimulation, Cryotherapy, Moist heat, Ultrasound, and Manual therapy, Dry Needling, Canalith Repositioning, Vestibular Rehab   PLAN FOR NEXT SESSION:  Ask if she was able to get/use foam roller.  Consider ultrasound, ionto/dry needling for pain.  (Will need to add ionto to POC).  Continue to work on gentle core stability-finding/maintaining lumbar spine neutral position.  Pt is already doing some piriformis stretching-make sure she is not stretching into painful range.       Janene Harvey, PT, DPT 04/09/22 12:43 PM  [facility] [address] [place], [place],  [address] Phone # [phone] Fax # [phone]

## 2022-04-09 ENCOUNTER — Encounter: Payer: Self-pay | Admitting: Physical Therapy

## 2022-04-09 ENCOUNTER — Ambulatory Visit: Payer: PPO | Admitting: Physical Therapy

## 2022-04-09 DIAGNOSIS — M5459 Other low back pain: Secondary | ICD-10-CM

## 2022-04-09 DIAGNOSIS — M6281 Muscle weakness (generalized): Secondary | ICD-10-CM

## 2022-04-13 ENCOUNTER — Encounter: Payer: Self-pay | Admitting: Physical Therapy

## 2022-04-13 ENCOUNTER — Ambulatory Visit: Payer: PPO | Attending: Sports Medicine | Admitting: Physical Therapy

## 2022-04-13 DIAGNOSIS — M5459 Other low back pain: Secondary | ICD-10-CM | POA: Insufficient documentation

## 2022-04-13 DIAGNOSIS — M6281 Muscle weakness (generalized): Secondary | ICD-10-CM | POA: Diagnosis not present

## 2022-04-13 NOTE — Therapy (Signed)
OUTPATIENT PHYSICAL THERAPY TREATMENT NOTE   Patient Name: Regina [name] MRN: [id number] DOB:[birth year], 81 y.o., female Today's Date: 04/13/2022  PCP: Cari Caraway, MD REFERRING PROVIDER: Inez Catalina, MD  END OF SESSION:   PT End of Session - 04/13/22 1228     Visit Number 7    Number of Visits 13    Date for PT Re-Evaluation 04/28/22    Authorization Type HTA    Progress Note Due on Visit 10    PT Start Time 1232    PT Stop Time 1320    PT Time Calculation (min) 48 min    Activity Tolerance Patient tolerated treatment well    Behavior During Therapy WFL for tasks assessed/performed                Past Medical History:  Diagnosis Date   Headache    Nervous system disorder    spinal cord tethered   Osteoporosis    Past Surgical History:  Procedure Laterality Date   PARTIAL HYSTERECTOMY  1996   TUBAL LIGATION     Patient Active Problem List   Diagnosis Date Noted   Left buttock pain 11/14/2020   Myofascial pain 11/14/2020   Piriformis syndrome of left side 05/07/2020   Family history of glaucoma 03/29/2019   Meibomian gland dysfunction (MGD) of both eyes 03/29/2019   Vitreous syneresis of both eyes 03/29/2019   Open angle with borderline findings and low glaucoma risk in both eyes 03/02/2018   Dysphonia 01/03/2018   Post-nasal drainage 01/03/2018   Heterozygous factor V Leiden mutation ([place]) 05/28/2016   Amaurosis fugax of left eye 05/28/2016   Migraine aura without headache 04/13/2016   Dermatochalasis of both upper eyelids 08/01/2015   Asthma, mild intermittent 06/05/2014   Cough 03/20/2014   Lipoma 02/22/2013    REFERRING DIAG: Q06.8 (ICD-10-CM) - Other specified congenital malformations of spinal cord  THERAPY DIAG:  Other low back pain  Muscle weakness (generalized)  Rationale for Evaluation and Treatment Rehabilitation  PERTINENT HISTORY:  hx of piriformis pain, myofascial pain, migraine, asthma, lipoma at base of  spine  PRECAUTIONS: none  SUBJECTIVE: "Same old, same old."  Still try to get comfortable.  I can't seem to get positioned right for sleep at night.  The vertigo work helped and haven't had any more trouble.  Feel good about that now.  Feel more confident that we are making some slight improvements overall.  PAIN:  PAIN:  Are you having pain? No: NPRS scale: 0/10 Pain location: L buttocks Pain description: sore Aggravating factors: sleeping positions Relieving factors: ice, moving around in the day     OBJECTIVE:     TODAY'S TREATMENT: 04/13/2022 Activity Comments  Reviewed parts of recent additions to HEP: Bird dog x 5 reps Sidelying hip abduction x 10 reps, 2 sets Stagger stance sit to stand with LLE tucked posteriorly   Cues for neutral spine and control when L hip is the weightbearing hip Cues for eccentric control to sit; use of hands to slowly control stand>sit at needed  Performed quadruped hip extension only, 10 reps each side, with decreased stability through L hip as weightbearing   Tall kneeling activities to work on glut activation: Tall kneel <>sit back on heels, x 10 reps Tall kneel sidestep out and in, R and L, 10 reps each   Cramping in bilateral hamstrings  Hamstring stretches-performed in long sit position while on mat (pt c/o pain in low back), so moved to seated hamstring stretch,  leg propped on 4" block, 3 x 30 sec   Standing hip stabilization: LLE as stance with RLE step taps x 10 reps LLE forward step ups to 6" step (unable to perform without definite use of UE support), then to 4" step, light UE support, x 10 reps   Floor>stand transfer:  pt performs coming up from floor into "downward dog" position and walks hands up legs to stand.  Attempted tall kneel>1/2 kneel>stand, and pt is unable to do without UE support    PATIENT EDUCATION: Education details: Update to HEP-see below; discussed options for ultrasound, ionto, and dry needling (she has had dry  needling before, does not give response about pursuing order for iontophoresis) Person educated: Patient Education method: Explanation, Demonstration, Verbal cues, and Handouts Education comprehension: verbalized understanding, returned demonstration, and verbal cues required Access Code: [email or web address] URL: [email or web address] Date: 04/13/2022 (most recent update) Prepared by: [facility]  Exercises - Standing Lumbar Spine Flexion Stretch Counter  - 1 x daily - 7 x weekly - 1 sets - 5-10 reps - Bird Dog  - 1 x daily - 5 x weekly - 2 sets - 10 reps - Sidelying Hip Abduction  - 1 x daily - 5 x weekly - 2 sets - 10 reps - Staggered Sit-to-Stand  - 1 x daily - 5 x weekly - 2 sets - 10 reps - Seated Hamstring Stretch  - 2 x daily - 7 x weekly - 1 sets - 3 reps - 30 sec hold   ____________________________________________________________________ (objective measures completed at initial evaluation unless otherwise dated)  From eval 03/16/2022:     DIAGNOSTIC FINDINGS: MRI 2021 lumbar spine   COGNITION: Overall cognitive status: Within functional limits for tasks assessed             SENSATION: Numbness in R distal/lateral thigh     MUSCLE LENGTH: Hamstrings: Right -20 deg from neutral; Left -12 deg from neutral SLR test:  Right 80 degrees with pain; Left 90 degrees   POSTURE: anterior pelvic tilt and appears to have some hypermobility through spine/pelvis in supine   LOWER EXTREMITY ROM:      Active  Right Eval Left Eval  Hip flexion      Hip extension      Hip abduction      Hip adduction      Hip internal rotation      Hip external rotation      Knee flexion      Knee extension      Ankle dorsiflexion neutral -3 degrees from neutral  Ankle plantarflexion      Ankle inversion      Ankle eversion       (Blank rows = not tested)   LOWER EXTREMITY MMT:     MMT Right Eval Left Eval  Hip flexion 4+ 4  Hip extension       Hip abduction      Hip adduction      Hip internal rotation      Hip external rotation      Knee flexion 4+ 4  Knee extension 5 4+  Ankle dorsiflexion 4 4  Ankle plantarflexion      Ankle inversion      Ankle eversion      (Blank rows = not tested)   TRANSFERS: Assistive device utilized: None  Sit to stand: Complete Independence Stand to sit: Complete Independence     FUNCTIONAL TESTs:  PALPATION:  Pt tender to palpation along PSIS bilaterally.  Pt tender at piriformis area. FUNCTIONAL MOVEMENTS in supine:  pelvic tilt: posterior-no increase in pain, anterior-"pulling in back"; single knee to chest-no increase in pain     TODAY'S TREATMENT:  Initiated HEP-see below     PATIENT EDUCATION: Education details: PT eval results, POC, initiated HEP; initially discussed use of ice massage, need to "calm down" the PSIS/buttock area and allow for core stability, painfree mobility/positioning in sleeping Person educated: Patient Education method: Explanation, Demonstration, and Handouts Education comprehension: verbalized understanding and returned demonstration     HOME EXERCISE PROGRAM: Access Code: [email or web address] URL: [email or web address] Date: 03/16/2022 Prepared by: [facility]   Exercises - Hooklying Single Knee to Chest Stretch  - 1 x daily - 7 x weekly - 1 sets - 3-5 reps - Supine Posterior Pelvic Tilt  - 1 x daily - 7 x weekly - 1-2 sets - 5-10 reps   _______________________________________________________________________    GOALS: Goals reviewed with patient? Yes   SHORT TERM GOALS: Target date: 04/14/2022   Pt will be independent with HEP for improved core stability, decreased pain during sleeping hours. Baseline: Goal status: GOAL ACHIEVED   2.  Pt will report decrease in pain by at least 50% during night time hours. Baseline: Reports 7-8/10; reports maybe 20% improvement, 6-7/10 Goal status: NOT MET,  progressing     LONG TERM GOALS: Target date: 04/28/2022   Pt will be independent with HEP for improved core stability, lower extremity strength, and sleep positioning and decreased pain. Baseline:  Goal status: IN PROGRESS   2.  Pt will report decrease in pain by at least 75% during night time hours. Baseline:  Goal status: IN PROGRESS   3.  Pt will verbalize understanding of ways to manage/control low back pain and positioning/body mechanics. Baseline:  Goal status: IN PROGRESS     ASSESSMENT:   CLINICAL IMPRESSION: Assessed STGs this visit; pt has met 1 of 2 STGs.  She has met STG 1 for HEP; STG 2 partially met, with pt improving pain slightly at night, just not to goal level.  Continued to progress ways to address glut strength, core stability today, with pt noting increased SI joint area and L buttock pain at end of session.  No further c/o vertigo today.  She will continue to benefit from skilled PT towards LTGs for improved flexibility, stability, and decreased pain.  OBJECTIVE IMPAIRMENTS decreased ROM, increased muscle spasms, impaired flexibility, and pain.    ACTIVITY LIMITATIONS sleeping and bed mobility   PARTICIPATION LIMITATIONS: community activity   PERSONAL FACTORS hx of piriformis pain, myofascial pain, migraine, asthma, lipoma at base of spine are also affecting patient's functional outcome.    REHAB POTENTIAL: Good   CLINICAL DECISION MAKING: Evolving/moderate complexity   EVALUATION COMPLEXITY: Moderate   PLAN: PT FREQUENCY: 2x/week   PT DURATION: 6 weeks plus eval visit = 7 weeks total POC   PLANNED INTERVENTIONS: Therapeutic exercises, Therapeutic activity, Neuromuscular re-education, Balance training, Gait training, Patient/Family education, Joint mobilization, Aquatic Therapy, Electrical stimulation, Cryotherapy, Moist heat, Ultrasound, and Manual therapy, Dry Needling, Canalith Repositioning, Vestibular Rehab   PLAN FOR NEXT SESSION:  Ask if she  was able to get/use foam roller.  Consider ultrasound, ionto/dry needling for pain.  (Will need to add ionto to POC).  Continue to work on gentle core stability-finding/maintaining lumbar spine neutral position as we progress to more standing exercises.  Mady Haagensen, PT 04/13/22 3:07  PM Phone: [phone] Fax: [phone]  [facility] [address], [place], [place] [address] Phone # [phone] Fax # [phone]

## 2022-04-16 ENCOUNTER — Ambulatory Visit: Payer: PPO | Admitting: Physical Therapy

## 2022-04-16 ENCOUNTER — Encounter: Payer: Self-pay | Admitting: Physical Therapy

## 2022-04-16 DIAGNOSIS — M5459 Other low back pain: Secondary | ICD-10-CM

## 2022-04-16 DIAGNOSIS — M6281 Muscle weakness (generalized): Secondary | ICD-10-CM

## 2022-04-16 NOTE — Therapy (Signed)
OUTPATIENT PHYSICAL THERAPY TREATMENT NOTE   Patient Name: Regina [name] MRN: [id number] DOB:1941/06/17, 81 y.o., female Today's Date: 04/16/2022  PCP: Cari Caraway, MD REFERRING PROVIDER: Inez Catalina, MD  END OF SESSION:   PT End of Session - 04/16/22 1106     Visit Number 8    Number of Visits 13    Date for PT Re-Evaluation 04/28/22    Authorization Type HTA    Progress Note Due on Visit 10    PT Start Time 1105    PT Stop Time 1150    PT Time Calculation (min) 45 min    Activity Tolerance Patient tolerated treatment well    Behavior During Therapy WFL for tasks assessed/performed                Past Medical History:  Diagnosis Date   Headache    Nervous system disorder    spinal cord tethered   Osteoporosis    Past Surgical History:  Procedure Laterality Date   PARTIAL HYSTERECTOMY  1996   TUBAL LIGATION     Patient Active Problem List   Diagnosis Date Noted   Left buttock pain 11/14/2020   Myofascial pain 11/14/2020   Piriformis syndrome of left side 05/07/2020   Family history of glaucoma 03/29/2019   Meibomian gland dysfunction (MGD) of both eyes 03/29/2019   Vitreous syneresis of both eyes 03/29/2019   Open angle with borderline findings and low glaucoma risk in both eyes 03/02/2018   Dysphonia 01/03/2018   Post-nasal drainage 01/03/2018   Heterozygous factor V Leiden mutation ([place]) 05/28/2016   Amaurosis fugax of left eye 05/28/2016   Migraine aura without headache 04/13/2016   Dermatochalasis of both upper eyelids 08/01/2015   Asthma, mild intermittent 06/05/2014   Cough 03/20/2014   Lipoma 02/22/2013    REFERRING DIAG: Q06.8 (ICD-10-CM) - Other specified congenital malformations of spinal cord  THERAPY DIAG:  Muscle weakness (generalized)  Other low back pain  Rationale for Evaluation and Treatment Rehabilitation  PERTINENT HISTORY:  hx of piriformis pain, myofascial pain, migraine, asthma, lipoma at base of  spine  PRECAUTIONS: none  SUBJECTIVE:  When I do a leg lift, feel a clicking in my low back (SI joint) area.  Just a little sore, more tender today  PAIN:   Are you having pain? No: NPRS scale: 1-2/10 Pain location: L buttocks Pain description: sore Aggravating factors: sleeping positions Relieving factors: ice, moving around in the day     OBJECTIVE:    TODAY'S TREATMENT: 04/16/2022 Activity Comments  STM to L gluts area, with increased pain; palapated (in sidelying) with no pain to palpation with SI joint and immediate bony prominence areas Relieves pain with supine SKTC stretch after palpation  Abdominal activation in supine with hooklying marching 2 x 5 reps; hooklying clamshell 2 x 5 reps, heelslides 2 x 5 reps   Sidelying isometric hip/glut set exercies with L hip flexed and foot against wall-flexion, extension, abduction, adduction, leg lengthening x 5 reps each Increased discomfort with activation in extension/flexion positions  Ultrasound to L buttocks, 8 minutes, 1MHz, 5 cm head, 50% pulse, at 1.0 w/cm2 for pain reduction at end of session Pt reports not as much soreness after U/S as at beginning of session  Discussed use of foam roller (pt has not gotten) and tennis balls as way to self-massage glut area, but pt has not yet done any of these at home.  Discussed other pain management-she uses ice at times.  PATIENT EDUCATION: Education details: Continue course of exercises at home for core stabilization, avoid aggressive stretching; rationale to try ultrasound for pain relief Person educated: Patient Education method: Explanation Education comprehension: verbalized understanding     URL: [email or web address] Date: 04/13/2022 (most recent update) Prepared by: [facility]  Exercises - Standing Lumbar Spine Flexion Stretch Counter  - 1 x daily - 7 x weekly - 1 sets - 5-10 reps - Bird Dog  - 1 x daily - 5 x weekly -  2 sets - 10 reps - Sidelying Hip Abduction  - 1 x daily - 5 x weekly - 2 sets - 10 reps - Staggered Sit-to-Stand  - 1 x daily - 5 x weekly - 2 sets - 10 reps - Seated Hamstring Stretch  - 2 x daily - 7 x weekly - 1 sets - 3 reps - 30 sec hold   ____________________________________________________________________ (objective measures completed at initial evaluation unless otherwise dated)  From eval 03/16/2022:     DIAGNOSTIC FINDINGS: MRI 2021 lumbar spine   COGNITION: Overall cognitive status: Within functional limits for tasks assessed             SENSATION: Numbness in R distal/lateral thigh     MUSCLE LENGTH: Hamstrings: Right -20 deg from neutral; Left -12 deg from neutral SLR test:  Right 80 degrees with pain; Left 90 degrees   POSTURE: anterior pelvic tilt and appears to have some hypermobility through spine/pelvis in supine   LOWER EXTREMITY ROM:      Active  Right Eval Left Eval  Hip flexion      Hip extension      Hip abduction      Hip adduction      Hip internal rotation      Hip external rotation      Knee flexion      Knee extension      Ankle dorsiflexion neutral -3 degrees from neutral  Ankle plantarflexion      Ankle inversion      Ankle eversion       (Blank rows = not tested)   LOWER EXTREMITY MMT:     MMT Right Eval Left Eval  Hip flexion 4+ 4  Hip extension      Hip abduction      Hip adduction      Hip internal rotation      Hip external rotation      Knee flexion 4+ 4  Knee extension 5 4+  Ankle dorsiflexion 4 4  Ankle plantarflexion      Ankle inversion      Ankle eversion      (Blank rows = not tested)   TRANSFERS: Assistive device utilized: None  Sit to stand: Complete Independence Stand to sit: Complete Independence     FUNCTIONAL TESTs:  PALPATION:   Pt tender to palpation along PSIS bilaterally.  Pt tender at piriformis area. FUNCTIONAL MOVEMENTS in supine:  pelvic tilt: posterior-no increase in pain,  anterior-"pulling in back"; single knee to chest-no increase in pain     TODAY'S TREATMENT:  Initiated HEP-see below     PATIENT EDUCATION: Education details: PT eval results, POC, initiated HEP; initially discussed use of ice massage, need to "calm down" the PSIS/buttock area and allow for core stability, painfree mobility/positioning in sleeping Person educated: Patient Education method: Explanation, Demonstration, and Handouts Education comprehension: verbalized understanding and returned demonstration     HOME EXERCISE PROGRAM: Access Code:  [email or web address] URL: [email or web address] Date: 03/16/2022 Prepared by: [facility]   Exercises - Hooklying Single Knee to Chest Stretch  - 1 x daily - 7 x weekly - 1 sets - 3-5 reps - Supine Posterior Pelvic Tilt  - 1 x daily - 7 x weekly - 1-2 sets - 5-10 reps   _______________________________________________________________________    GOALS: Goals reviewed with patient? Yes   SHORT TERM GOALS: Target date: 04/14/2022   Pt will be independent with HEP for improved core stability, decreased pain during sleeping hours. Baseline: Goal status: GOAL ACHIEVED   2.  Pt will report decrease in pain by at least 50% during night time hours. Baseline: Reports 7-8/10; reports maybe 20% improvement, 6-7/10 Goal status: NOT MET, progressing     LONG TERM GOALS: Target date: 04/28/2022   Pt will be independent with HEP for improved core stability, lower extremity strength, and sleep positioning and decreased pain. Baseline:  Goal status: IN PROGRESS   2.  Pt will report decrease in pain by at least 75% during night time hours. Baseline:  Goal status: IN PROGRESS   3.  Pt will verbalize understanding of ways to manage/control low back pain and positioning/body mechanics. Baseline:  Goal status: IN PROGRESS     ASSESSMENT:   CLINICAL IMPRESSION: Pt reports increased pain in session today,  reporting she has continued to have general soreness in L>R buttocks over the past several days.  Tried ultrasound to help with pain relief today, and she reports some relief.  Continued to reinforce neutral spine posture, abdominal activation.  May need to further assess SI joint involvement as source of pain? She will continue to benefit from skilled PT towards LTGs for improved flexibility, stability, and decreased pain.  OBJECTIVE IMPAIRMENTS decreased ROM, increased muscle spasms, impaired flexibility, and pain.    ACTIVITY LIMITATIONS sleeping and bed mobility   PARTICIPATION LIMITATIONS: community activity   PERSONAL FACTORS hx of piriformis pain, myofascial pain, migraine, asthma, lipoma at base of spine are also affecting patient's functional outcome.    REHAB POTENTIAL: Good   CLINICAL DECISION MAKING: Evolving/moderate complexity   EVALUATION COMPLEXITY: Moderate   PLAN: PT FREQUENCY: 2x/week   PT DURATION: 6 weeks plus eval visit = 7 weeks total POC   PLANNED INTERVENTIONS: Therapeutic exercises, Therapeutic activity, Neuromuscular re-education, Balance training, Gait training, Patient/Family education, Joint mobilization, Aquatic Therapy, Electrical stimulation, Cryotherapy, Moist heat, Ultrasound, and Manual therapy, Dry Needling, Canalith Repositioning, Vestibular Rehab   PLAN FOR NEXT SESSION:  Consider further ultrasound, ionto/dry needling for pain.  (Will need to add ionto to POC).  Continue to work on gentle core stability-finding/maintaining lumbar spine neutral position as we progress to more standing exercises.  Mady Haagensen, PT 04/16/22 1:04 PM Phone: [phone] Fax: [phone]   [facility] at [facility] [place], [place], [place] [address] Phone # [phone] Fax # [phone]

## 2022-04-21 ENCOUNTER — Ambulatory Visit: Payer: PPO | Admitting: Physical Therapy

## 2022-04-21 ENCOUNTER — Encounter: Payer: Self-pay | Admitting: Physical Therapy

## 2022-04-21 DIAGNOSIS — M6281 Muscle weakness (generalized): Secondary | ICD-10-CM

## 2022-04-21 DIAGNOSIS — M5459 Other low back pain: Secondary | ICD-10-CM

## 2022-04-21 NOTE — Patient Instructions (Signed)
Access Code: [email or web address] URL: [email or web address] Date: 04/21/2022 Prepared by: [facility]  Exercises - Standing Lumbar Spine Flexion Stretch Counter  - 1 x daily - 7 x weekly - 1 sets - 5-10 reps - Bird Dog  - 1 x daily - 5 x weekly - 2 sets - 10 reps - Sidelying Hip Abduction  - 1 x daily - 5 x weekly - 2 sets - 10 reps - Staggered Sit-to-Stand  - 1 x daily - 5 x weekly - 2 sets - 10 reps - Seated Hamstring Stretch  - 2 x daily - 7 x weekly - 1 sets - 3 reps - 30 sec hold - Seated Hip Adduction Isometrics with Ball  - 1 x daily - 7 x weekly - 3 sets - 10 reps - Seated Isometric Hip Abduction with Belt  - 1 x daily - 7 x weekly - 3 sets - 10 reps

## 2022-04-21 NOTE — Therapy (Signed)
OUTPATIENT PHYSICAL THERAPY TREATMENT NOTE   Patient Name: Regina [name] MRN: [id number] DOB:1940-12-16, 81 y.o., female Today's Date: 04/21/2022  PCP: Cari Caraway, MD REFERRING PROVIDER: Inez Catalina, MD  END OF SESSION:   PT End of Session - 04/21/22 1235     Visit Number 9    Number of Visits 13    Date for PT Re-Evaluation 04/28/22    Authorization Type HTA    Progress Note Due on Visit 10    PT Start Time 1234    PT Stop Time 1325    PT Time Calculation (min) 51 min    Activity Tolerance Patient tolerated treatment well    Behavior During Therapy WFL for tasks assessed/performed                Past Medical History:  Diagnosis Date   Headache    Nervous system disorder    spinal cord tethered   Osteoporosis    Past Surgical History:  Procedure Laterality Date   PARTIAL HYSTERECTOMY  1996   TUBAL LIGATION     Patient Active Problem List   Diagnosis Date Noted   Left buttock pain 11/14/2020   Myofascial pain 11/14/2020   Piriformis syndrome of left side 05/07/2020   Family history of glaucoma 03/29/2019   Meibomian gland dysfunction (MGD) of both eyes 03/29/2019   Vitreous syneresis of both eyes 03/29/2019   Open angle with borderline findings and low glaucoma risk in both eyes 03/02/2018   Dysphonia 01/03/2018   Post-nasal drainage 01/03/2018   Heterozygous factor V Leiden mutation ([place]) 05/28/2016   Amaurosis fugax of left eye 05/28/2016   Migraine aura without headache 04/13/2016   Dermatochalasis of both upper eyelids 08/01/2015   Asthma, mild intermittent 06/05/2014   Cough 03/20/2014   Lipoma 02/22/2013    REFERRING DIAG: Q06.8 (ICD-10-CM) - Other specified congenital malformations of spinal cord  THERAPY DIAG:  Muscle weakness (generalized)  Other low back pain  Rationale for Evaluation and Treatment Rehabilitation  PERTINENT HISTORY:  hx of piriformis pain, myofascial pain, migraine, asthma, lipoma at base of  spine  PRECAUTIONS: none  SUBJECTIVE:  The treatment (ultrasound) helped the left side a lot last time; felt better and realized the right side is bothering me.  Some nights are better with sleeping; some nights it doesn't matter what I do and the pain is just there.  PAIN:   Are you having pain? No: NPRS scale: 1-2/10 Pain location: R buttocks Pain description: sore Aggravating factors: sleeping positions Relieving factors: ice, moving around in the day     Occasional pain in R thigh  OBJECTIVE:    TODAY'S TREATMENT: 04/21/2022 Activity Comments  Measured leg length in supine after performing 3 reps bridging:  LLE 79.5 cm; RLE 80 cm LLE appears slightly longer, but pt does tend to keep LLE in externally rotated position  With palpation at SI joint area, no pain today; tenderness to palpation at gluts R and L   Performed hooklying SI joint stabilization, each leg position x 2 reps (in bridge position, manual energy technique) Pt with no discomfort, but difficulty performing with LLE in weightbearing part of bridge  Seated Hip adduction with ball, 2 x 10 reps Cues for abdominal activation  Seated Hip abduction against redesisted band,  Cues for abdominal activation  Ultrasound to R buttocks, 7 minutes, then L buttocks x 5 minutes, 1MHz, 5 cm head, 50% pulse, at 1.0 w/cm2 for pain reduction at end of session No c/o  pain at end of session         Access Code: [email or web address] URL: [email or web address] Date: 04/21/2022 (most recent update) Prepared by: [facility]  Exercises - Standing Lumbar Spine Flexion Stretch Counter  - 1 x daily - 7 x weekly - 1 sets - 5-10 reps - Bird Dog  - 1 x daily - 5 x weekly - 2 sets - 10 reps - Sidelying Hip Abduction  - 1 x daily - 5 x weekly - 2 sets - 10 reps - Staggered Sit-to-Stand  - 1 x daily - 5 x weekly - 2 sets - 10 reps - Seated Hamstring Stretch  - 2 x daily - 7 x weekly - 1 sets - 3 reps - 30 sec  hold - Seated Hip Adduction Isometrics with Ball  - 1 x daily - 7 x weekly - 3 sets - 10 reps - Seated Isometric Hip Abduction with Belt  - 1 x daily - 7 x weekly - 3 sets - 10 reps   ____________________________________________________________________ (objective measures completed at initial evaluation unless otherwise dated)  From eval 03/16/2022:     DIAGNOSTIC FINDINGS: MRI 2021 lumbar spine   COGNITION: Overall cognitive status: Within functional limits for tasks assessed             SENSATION: Numbness in R distal/lateral thigh     MUSCLE LENGTH: Hamstrings: Right -20 deg from neutral; Left -12 deg from neutral SLR test:  Right 80 degrees with pain; Left 90 degrees   POSTURE: anterior pelvic tilt and appears to have some hypermobility through spine/pelvis in supine   LOWER EXTREMITY ROM:      Active  Right Eval Left Eval  Hip flexion      Hip extension      Hip abduction      Hip adduction      Hip internal rotation      Hip external rotation      Knee flexion      Knee extension      Ankle dorsiflexion neutral -3 degrees from neutral  Ankle plantarflexion      Ankle inversion      Ankle eversion       (Blank rows = not tested)   LOWER EXTREMITY MMT:     MMT Right Eval Left Eval  Hip flexion 4+ 4  Hip extension      Hip abduction      Hip adduction      Hip internal rotation      Hip external rotation      Knee flexion 4+ 4  Knee extension 5 4+  Ankle dorsiflexion 4 4  Ankle plantarflexion      Ankle inversion      Ankle eversion      (Blank rows = not tested)   TRANSFERS: Assistive device utilized: None  Sit to stand: Complete Independence Stand to sit: Complete Independence     FUNCTIONAL TESTs:  PALPATION:   Pt tender to palpation along PSIS bilaterally.  Pt tender at piriformis area. FUNCTIONAL MOVEMENTS in supine:  pelvic tilt: posterior-no increase in pain, anterior-"pulling in back"; single knee to chest-no increase in pain      TODAY'S TREATMENT:  Initiated HEP-see below     PATIENT EDUCATION: Education details: PT eval results, POC, initiated HEP; initially discussed use of ice massage, need to "calm down" the PSIS/buttock area and allow for core stability, painfree mobility/positioning in sleeping Person educated:  Patient Education method: Explanation, Demonstration, and Handouts Education comprehension: verbalized understanding and returned demonstration     HOME EXERCISE PROGRAM: Access Code: [email or web address] URL: [email or web address] Date: 03/16/2022 Prepared by: [facility]   Exercises - Hooklying Single Knee to Chest Stretch  - 1 x daily - 7 x weekly - 1 sets - 3-5 reps - Supine Posterior Pelvic Tilt  - 1 x daily - 7 x weekly - 1-2 sets - 5-10 reps   _______________________________________________________________________    GOALS: Goals reviewed with patient? Yes   SHORT TERM GOALS: Target date: 04/14/2022   Pt will be independent with HEP for improved core stability, decreased pain during sleeping hours. Baseline: Goal status: GOAL ACHIEVED   2.  Pt will report decrease in pain by at least 50% during night time hours. Baseline: Reports 7-8/10; reports maybe 20% improvement, 6-7/10 Goal status: NOT MET, progressing     LONG TERM GOALS: Target date: 04/28/2022   Pt will be independent with HEP for improved core stability, lower extremity strength, and sleep positioning and decreased pain. Baseline:  Goal status: IN PROGRESS   2.  Pt will report decrease in pain by at least 75% during night time hours. Baseline:  Goal status: IN PROGRESS   3.  Pt will verbalize understanding of ways to manage/control low back pain and positioning/body mechanics. Baseline:  Goal status: IN PROGRESS     ASSESSMENT:   CLINICAL IMPRESSION: Pt reports feeling like the ultrasound to L buttocks helped last visit and she could tell the difference, with R  experiencing more pain.  She does report experiencing some nights with improved sleep.  Assessed again SI joint, as pt c/o pain more proximal than piriformis at times (more in gluts) with palpation; but no pain with palpation.  Assessed Leg length, with 0.5 cm difference measured in supine.  Further worked on hip stability exercises (added to HEP) and performed ultrasound to bilateral buttocks/gluts today, with no c/o pain at end of session.  Will continue to benefit from skilled PT to progress towards LTGs.  OBJECTIVE IMPAIRMENTS decreased ROM, increased muscle spasms, impaired flexibility, and pain.    ACTIVITY LIMITATIONS sleeping and bed mobility   PARTICIPATION LIMITATIONS: community activity   PERSONAL FACTORS hx of piriformis pain, myofascial pain, migraine, asthma, lipoma at base of spine are also affecting patient's functional outcome.    REHAB POTENTIAL: Good   CLINICAL DECISION MAKING: Evolving/moderate complexity   EVALUATION COMPLEXITY: Moderate   PLAN: PT FREQUENCY: 2x/week   PT DURATION: 6 weeks plus eval visit = 7 weeks total POC   PLANNED INTERVENTIONS: Therapeutic exercises, Therapeutic activity, Neuromuscular re-education, Balance training, Gait training, Patient/Family education, Joint mobilization, Aquatic Therapy, Electrical stimulation, Cryotherapy, Moist heat, Ultrasound, and Manual therapy, Dry Needling, Canalith Repositioning, Vestibular Rehab   PLAN FOR NEXT SESSION:  review updates to HEP; Check LTGs and discuss POC.  Mady Haagensen, PT 04/21/22 3:39 PM Phone: [phone] Fax: [phone]   [facility] at [facility], [address] [place], [place] [address] Phone # [phone] Fax # [phone]

## 2022-04-28 NOTE — Therapy (Signed)
OUTPATIENT PHYSICAL THERAPY PROGRESS NOTE   Patient Name: Regina [name] MRN: [id number] DOB:05-03-1941, 81 y.o., female Today's Date: 04/29/2022  PCP: Cari Caraway, MD REFERRING PROVIDER: Inez Catalina, MD   Progress Note Reporting Period 03/16/22 to 04/29/22  See note below for Objective Data and Assessment of Progress/Goals.    END OF SESSION:   PT End of Session - 04/29/22 1701     Visit Number 10    Number of Visits 14    Date for PT Re-Evaluation 05/27/22    Authorization Type HTA    Progress Note Due on Visit 20    PT Start Time 1620    PT Stop Time 1659    PT Time Calculation (min) 39 min    Activity Tolerance Patient tolerated treatment well    Behavior During Therapy WFL for tasks assessed/performed               Past Medical History:  Diagnosis Date   Headache    Nervous system disorder    spinal cord tethered   Osteoporosis    Past Surgical History:  Procedure Laterality Date   PARTIAL HYSTERECTOMY  1996   TUBAL LIGATION     Patient Active Problem List   Diagnosis Date Noted   Left buttock pain 11/14/2020   Myofascial pain 11/14/2020   Piriformis syndrome of left side 05/07/2020   Family history of glaucoma 03/29/2019   Meibomian gland dysfunction (MGD) of both eyes 03/29/2019   Vitreous syneresis of both eyes 03/29/2019   Open angle with borderline findings and low glaucoma risk in both eyes 03/02/2018   Dysphonia 01/03/2018   Post-nasal drainage 01/03/2018   Heterozygous factor V Leiden mutation ([place]) 05/28/2016   Amaurosis fugax of left eye 05/28/2016   Migraine aura without headache 04/13/2016   Dermatochalasis of both upper eyelids 08/01/2015   Asthma, mild intermittent 06/05/2014   Cough 03/20/2014   Lipoma 02/22/2013    REFERRING DIAG: Q06.8 (ICD-10-CM) - Other specified congenital malformations of spinal cord  THERAPY DIAG:  Muscle weakness (generalized)  Other low back pain  Rationale for Evaluation and Treatment  Rehabilitation  PERTINENT HISTORY:  hx of piriformis pain, myofascial pain, migraine, asthma, lipoma at base of spine  PRECAUTIONS: none  SUBJECTIVE:  Reports great benefit from vertigo treatment. Unsure about her progress and would like some  input on this.   PAIN:   Are you having pain? No: NPRS scale: 0.5/10 Pain location: L buttocks Pain description: sore Aggravating factors: sleeping positions Relieving factors: ice, moving around in the day     Occasional pain in R thigh  OBJECTIVE:    TODAY'S TREATMENT: 04/29/22  LOWER EXTREMITY ROM:      Active  Right Eval Left Eval Right 04/29/22 Left 04/29/22  Ankle dorsiflexion neutral -3 degrees from neutral 0 -2  Hip IR   27 28 *c/o pulling  Hip ER   35 37   (Blank rows = not tested)   LOWER EXTREMITY MMT:    MMT Right Eval Left Eval Right 04/29/22  Left 04/29/22   Hip flexion   4+ 4+  Hip extension      Hip abduction   4+ 4+  Hip adduction   4+ 4+  Hip internal rotation   4 4  Hip external rotation   4 4  Knee flexion 4+ 4 4+ 4  Knee extension 5 4+ 5 4  Ankle dorsiflexion 4 4 4+ 4+  Ankle plantarflexion   5 2+  Ankle  inversion      Ankle eversion      (Blank rows = not tested)   Activity Comments  Supine LTR focusing on hip IR AROM B Cues to avoid compensating through spine; c/o LBP  Sitting hip IR AROM with ball b/w knees 10x Better tolerance   HOME EXERCISE PROGRAM Last updated: 04/29/22 Access Code: [email or web address] URL: [email or web address] Date: 04/29/2022 Prepared by: [facility]  Exercises - Standing Lumbar Spine Flexion Stretch Counter  - 1 x daily - 7 x weekly - 1 sets - 5-10 reps - Bird Dog  - 1 x daily - 5 x weekly - 2 sets - 10 reps - Sidelying Hip Abduction  - 1 x daily - 5 x weekly - 2 sets - 10 reps - Staggered Sit-to-Stand  - 1 x daily - 5 x weekly - 2 sets - 10 reps - Seated Hamstring Stretch  - 2 x daily - 7 x weekly - 1 sets - 3 reps  - 30 sec hold - Seated Hip Adduction Isometrics with Ball  - 1 x daily - 7 x weekly - 3 sets - 10 reps - Seated Isometric Hip Abduction with Belt  - 1 x daily - 7 x weekly - 3 sets - 10 reps - Gastroc Stretch on Wall  - 1 x daily - 5 x weekly - 2 sets - 10 reps - Heel Raises with Counter Support  - 1 x daily - 5 x weekly - 2 sets - 10 reps - Seated Hip Internal Rotation with Ball and Resistance  - 1 x daily - 5 x weekly - 2 sets - 10 reps  PATIENT EDUCATION: Education details: discussion on objective progress and remaining impairments as well as new goals with therapy; HEP update Person educated: Patient Education method: Explanation, Demonstration, Tactile cues, Verbal cues, and Handouts Education comprehension: verbalized understanding and returned demonstration   ___________________________________________________________ (objective measures completed at initial evaluation unless otherwise dated)  From eval 03/16/2022:     DIAGNOSTIC FINDINGS: MRI 2021 lumbar spine   COGNITION: Overall cognitive status: Within functional limits for tasks assessed             SENSATION: Numbness in R distal/lateral thigh     MUSCLE LENGTH: Hamstrings: Right -20 deg from neutral; Left -12 deg from neutral SLR test:  Right 80 degrees with pain; Left 90 degrees   POSTURE: anterior pelvic tilt and appears to have some hypermobility through spine/pelvis in supine   LOWER EXTREMITY ROM:      Active  Right Eval Left Eval  Hip flexion      Hip extension      Hip abduction      Hip adduction      Hip internal rotation      Hip external rotation      Knee flexion      Knee extension      Ankle dorsiflexion neutral -3 degrees from neutral  Ankle plantarflexion      Ankle inversion      Ankle eversion       (Blank rows = not tested)   LOWER EXTREMITY MMT:     MMT Right Eval Left Eval  Hip flexion 4+ 4  Hip extension      Hip abduction      Hip adduction      Hip internal rotation       Hip external rotation      Knee flexion  4+ 4  Knee extension 5 4+  Ankle dorsiflexion 4 4  Ankle plantarflexion      Ankle inversion      Ankle eversion      (Blank rows = not tested)   TRANSFERS: Assistive device utilized: None  Sit to stand: Complete Independence Stand to sit: Complete Independence     FUNCTIONAL TESTs:  PALPATION:   Pt tender to palpation along PSIS bilaterally.  Pt tender at piriformis area. FUNCTIONAL MOVEMENTS in supine:  pelvic tilt: posterior-no increase in pain, anterior-"pulling in back"; single knee to chest-no increase in pain     TODAY'S TREATMENT:  Initiated HEP-see below     PATIENT EDUCATION: Education details: PT eval results, POC, initiated HEP; initially discussed use of ice massage, need to "calm down" the PSIS/buttock area and allow for core stability, painfree mobility/positioning in sleeping Person educated: Patient Education method: Explanation, Demonstration, and Handouts Education comprehension: verbalized understanding and returned demonstration     HOME EXERCISE PROGRAM: Access Code: [email or web address] URL: [email or web address] Date: 03/16/2022 Prepared by: [facility]   Exercises - Hooklying Single Knee to Chest Stretch  - 1 x daily - 7 x weekly - 1 sets - 3-5 reps - Supine Posterior Pelvic Tilt  - 1 x daily - 7 x weekly - 1-2 sets - 5-10 reps   _______________________________________________________________________    GOALS: Goals reviewed with patient? Yes   SHORT TERM GOALS: Target date: 04/14/2022   Pt will be independent with HEP for improved core stability, decreased pain during sleeping hours. Baseline: Goal status: GOAL ACHIEVED   2.  Pt will report decrease in pain by at least 50% during night time hours. Baseline: Reports 7-8/10; reports maybe 20% improvement, 6-7/10; 04/29/22 reports some improvement in sleeping tolerance Goal status: NOT MET, progressing      LONG TERM GOALS: Target date: 05/27/2022   Pt will be independent with HEP for improved core stability, lower extremity strength, and sleep positioning and decreased pain. Baseline:  Goal status: IN PROGRESS   2.  Pt will report decrease in pain by at least 75% during night time hours. Baseline: 04/29/22 reports some improvement in pain levels with sleeping Goal status: IN PROGRESS   3.  Pt will verbalize understanding of ways to manage/control low back pain and positioning/body mechanics. Baseline:  Goal status: MET  4.  Pt will report tolerance for reciprocal stair navigation with 50% improvement in stability. Baseline: performing step-to pattern Goal status: INITIAL  5.  Pt will report 50% improvement ability to perform "Embrace tiger and return to the mountain" pose in tai-chi d/t improved hip ROM. Baseline: performing step-to pattern Goal status: INITIAL     ASSESSMENT:   CLINICAL IMPRESSION: Patient arrived to session with report of good relief from vertigo however unsure of progress otherwise. Patient reports some improvement in LBP with sleeping but notes that this fluctuates. B ankle dorsiflexion AROM grossly unchanged. Strength testing revealed most weakness evident in L ankle and knee. Updated goals to reflect patient's remaining weakness which affects her ability to do tai-chi and navigate stairs. Updated HEP to address strength and ROM deficits identified today. Patient will benefit from additional skilled PT services 1x/week for 4 weeks to address goals.   OBJECTIVE IMPAIRMENTS decreased ROM, increased muscle spasms, impaired flexibility, and pain.    ACTIVITY LIMITATIONS sleeping and bed mobility   PARTICIPATION LIMITATIONS: community activity   PERSONAL FACTORS hx of piriformis pain, myofascial pain, migraine, asthma, lipoma at  base of spine are also affecting patient's functional outcome.    REHAB POTENTIAL: Good   CLINICAL DECISION MAKING: Evolving/moderate  complexity   EVALUATION COMPLEXITY: Moderate   PLAN: PT FREQUENCY: 1x/week   PT DURATION: 4 weeks    PLANNED INTERVENTIONS: Therapeutic exercises, Therapeutic activity, Neuromuscular re-education, Balance training, Gait training, Patient/Family education, Joint mobilization, Aquatic Therapy, Electrical stimulation, Cryotherapy, Moist heat, Ultrasound, and Manual therapy, Dry Needling, Canalith Repositioning, Vestibular Rehab   PLAN FOR NEXT SESSION:  review updates to HEP; progress L calf and quad strength, hip ROM   Janene Harvey, PT, DPT 04/29/22 5:08 PM  [facility] [address] [place], [place] [address] Phone # [phone] Fax # [phone]

## 2022-04-29 ENCOUNTER — Ambulatory Visit: Payer: PPO | Admitting: Physical Therapy

## 2022-04-29 ENCOUNTER — Encounter: Payer: Self-pay | Admitting: Physical Therapy

## 2022-04-29 DIAGNOSIS — M6281 Muscle weakness (generalized): Secondary | ICD-10-CM

## 2022-04-29 DIAGNOSIS — M5459 Other low back pain: Secondary | ICD-10-CM | POA: Diagnosis not present

## 2022-04-30 DIAGNOSIS — M810 Age-related osteoporosis without current pathological fracture: Secondary | ICD-10-CM | POA: Diagnosis not present

## 2022-04-30 DIAGNOSIS — Z1231 Encounter for screening mammogram for malignant neoplasm of breast: Secondary | ICD-10-CM | POA: Diagnosis not present

## 2022-04-30 DIAGNOSIS — Z780 Asymptomatic menopausal state: Secondary | ICD-10-CM | POA: Diagnosis not present

## 2022-05-01 ENCOUNTER — Ambulatory Visit: Payer: PPO | Admitting: Physical Therapy

## 2022-05-01 ENCOUNTER — Emergency Department (HOSPITAL_COMMUNITY)
Admission: EM | Admit: 2022-05-01 | Discharge: 2022-05-01 | Disposition: A | Discharge status: Home / Self Care | Payer: PPO | Attending: Emergency Medicine | Admitting: Emergency Medicine

## 2022-05-01 ENCOUNTER — Emergency Department (HOSPITAL_COMMUNITY): Payer: PPO

## 2022-05-01 ENCOUNTER — Other Ambulatory Visit: Payer: Self-pay

## 2022-05-01 DIAGNOSIS — R6884 Jaw pain: Secondary | ICD-10-CM | POA: Diagnosis not present

## 2022-05-01 DIAGNOSIS — M810 Age-related osteoporosis without current pathological fracture: Secondary | ICD-10-CM | POA: Diagnosis not present

## 2022-05-01 DIAGNOSIS — R002 Palpitations: Secondary | ICD-10-CM | POA: Diagnosis not present

## 2022-05-01 DIAGNOSIS — R7989 Other specified abnormal findings of blood chemistry: Secondary | ICD-10-CM | POA: Diagnosis not present

## 2022-05-01 DIAGNOSIS — D6851 Activated protein C resistance: Secondary | ICD-10-CM | POA: Diagnosis not present

## 2022-05-01 DIAGNOSIS — R778 Other specified abnormalities of plasma proteins: Secondary | ICD-10-CM | POA: Diagnosis not present

## 2022-05-01 DIAGNOSIS — R000 Tachycardia, unspecified: Secondary | ICD-10-CM

## 2022-05-01 DIAGNOSIS — F418 Other specified anxiety disorders: Secondary | ICD-10-CM | POA: Diagnosis not present

## 2022-05-01 DIAGNOSIS — R079 Chest pain, unspecified: Secondary | ICD-10-CM | POA: Diagnosis not present

## 2022-05-01 DIAGNOSIS — G250 Essential tremor: Secondary | ICD-10-CM | POA: Diagnosis not present

## 2022-05-01 LAB — BASIC METABOLIC PANEL
Anion gap: 10 (ref 5–15)
BUN: 15 mg/dL (ref 8–23)
CO2: 23 mmol/L (ref 22–32)
Calcium: 8.8 mg/dL — ABNORMAL LOW (ref 8.9–10.3)
Chloride: 108 mmol/L (ref 98–111)
Creatinine, Ser: 0.67 mg/dL (ref 0.44–1.00)
GFR, Estimated: >60 mL/min (ref >60)
Glucose, Bld: 81 mg/dL (ref 70–99)
Potassium: 3.9 mmol/L (ref 3.5–5.1)
Sodium: 141 mmol/L (ref 135–145)

## 2022-05-01 LAB — CBC
HCT: 40.9 % (ref 36.0–46.0)
Hemoglobin: 13.5 g/dL (ref 12.0–15.0)
MCH: 30.3 pg (ref 26.0–34.0)
MCHC: 33 g/dL (ref 30.0–36.0)
MCV: 91.9 fL (ref 80.0–100.0)
Platelets: 211 10*3/uL (ref 150–400)
RBC: 4.45 MIL/uL (ref 3.87–5.11)
RDW: 13.8 % (ref 11.5–15.5)
WBC: 6.9 10*3/uL (ref 4.0–10.5)
nRBC: 0 % (ref 0.0–0.2)

## 2022-05-01 LAB — TROPONIN I (HIGH SENSITIVITY)
Troponin I (High Sensitivity): 22 ng/L — ABNORMAL HIGH (ref <18)
Troponin I (High Sensitivity): 19 ng/L — ABNORMAL HIGH (ref <18)

## 2022-05-01 LAB — MAGNESIUM: Magnesium: 1.9 mg/dL (ref 1.7–2.4)

## 2022-05-01 NOTE — ED Triage Notes (Addendum)
Pt woke last night feeling her heart was racing.  Accompanying that was a feeling of "tightness" in her teeth and up to her ears on the sides of her neck.  Symptoms resolved after about 30 min.  Called her on call PCP aabout 5 am and was told to come into the office this morning.  Received a call back that her "cardiac enzymes were 33" and she needed to go to the ED.  Pt is tearful and anxious at time of triage.  No CP, n/v/d, or shob

## 2022-05-01 NOTE — ED Notes (Signed)
Pt in bed, pt denies pain, pt states that she is ready to go home, pt verbalized understanding d/c and follow up, pt from dpt with sig other.

## 2022-05-01 NOTE — ED Provider Notes (Signed)
[facility] EMERGENCY DEPARTMENT Provider Note   CSN: [id number] Arrival date & time: 05/01/22  1152     History  Chief Complaint  Patient presents with   Chest Pain    ALONNI HEIMSOTH is a 81 y.o. female with a past medical history significant for factor V Leiden mutation, previous history of amaurosis fugax, previous history of syneresis bilateral eyes, as well as some chronic left-sided back pain and piriformis syndrome who presents with concern for isolated episode of heart racing last night.  Patient reports that she was laying in bed not doing anything in particular, she had taken gabapentin, baby aspirin, and her Singulair around that time, has not taken anything other new medications.  Patient endorses she drinks around 2 cups of coffee in the morning every day, drinks usually 2 glasses of wine in the evening.  She does endorse drinking wine last night prior to this episode.  Patient denies any history of ACS, stroke, hypertension, hyperlipidemia, diabetes, family history of early cardiac death or abnormal heart rhythms.  Patient reports no previous episodes of abnormal heart rhythm, heart racing sensation.  Patient reports that she did some deep breathing and the sensation completely resolved as well as pain in her jaw and tightness in her neck.  Patient saw her PCP this morning, had unremarkable EKG, and lab work performed, she was sent to the emergency department after call back with report of "cardiac enzyme of 33".  At this time she denies any symptoms and reports that she feels overall well.   Chest Pain      Home Medications Prior to Admission medications   Medication Sig Start Date End Date Taking? Authorizing Provider  ALPRAZOLAM PO Take by mouth as needed.    [provider]  Ascorbic Acid (VITAMIN C) 1000 MG tablet Take 1,000 mg by mouth daily.     [provider]  aspirin 81 MG EC tablet Take by mouth 2 (two) times a week.     [provider]  b complex vitamins capsule Take 1 capsule by mouth daily.    [provider]  calcium citrate-vitamin D (CITRACAL+D) 315-200 MG-UNIT tablet Take 1 tablet by mouth every morning.    [provider]  Cholecalciferol (VITAMIN D3) 2000 units TABS Take 2,000 Units by mouth daily.    [provider]  CHROMIUM PO Take 200 mg by mouth daily.    [provider]  Fexofenadine HCl (MUCINEX ALLERGY PO) Take 1 tablet by mouth daily.    [provider]  gabapentin (NEURONTIN) 100 MG capsule Take by mouth. 02/21/22   [provider]  gabapentin (NEURONTIN) 300 MG capsule Take 300 mg by mouth 3 (three) times daily.    [provider]  Loratadine 10 MG CAPS Take 1 capsule by mouth daily at 6 (six) AM. Joline Salt brand    [provider]  Lutein 6 MG CAPS Take 1 capsule by mouth daily.    [provider]  Lysine 1000 MG TABS Take 1 tablet by mouth daily.    [provider]  mirabegron ER (MYRBETRIQ) 50 MG TB24 tablet Take 1 tablet by mouth daily. 06/11/20   [provider]  montelukast (SINGULAIR) 10 MG tablet Take 1 tablet (10 mg total) by mouth at bedtime. 02/11/22   Spero Geralds, MD  Multiple Vitamins-Minerals [facility] E[facility] CARE PO) Take 1 capsule by mouth daily.    [provider]  Multiple Vitamins-Minerals ([place])  Take 1 tablet by mouth daily.    [provider]  Omega-3 Fatty Acids (OMEGA 3 500 PO) Take 1 tablet by mouth daily at 6 (six) AM.    [provider]  omeprazole (PRILOSEC) 40 MG capsule Take 40 mg by mouth 2 (two) times daily.    [provider]  Zinc Sulfate (ZINC-220 PO) Take 1 tablet by mouth daily at 6 (six) AM.    [provider]      Allergies    Other, Ketoprofen, Morphine and related, Tramadol, Oxycodone, and Venlafaxine    Review of Systems   Review of Systems  Cardiovascular:  Positive for chest pain.   All other systems reviewed and are negative.   Physical Exam Updated Vital Signs BP 122/67   Pulse 77   Temp 98.2 F (36.8 C) (Oral)   Resp 17   LMP 08/01/2014   SpO2 100%  Physical Exam Vitals and nursing note reviewed.  Constitutional:      General: She is not in acute distress.    Appearance: Normal appearance.  HENT:     Head: Normocephalic and atraumatic.  Eyes:     General:        Right eye: No discharge.        Left eye: No discharge.  Cardiovascular:     Rate and Rhythm: Normal rate and regular rhythm.     Heart sounds: No murmur heard.    No friction rub. No gallop.  Pulmonary:     Effort: Pulmonary effort is normal.     Breath sounds: Normal breath sounds.  Abdominal:     General: Bowel sounds are normal.     Palpations: Abdomen is soft.  Skin:    General: Skin is warm and dry.     Capillary Refill: Capillary refill takes less than 2 seconds.  Neurological:     Mental Status: She is alert and oriented to person, place, and time.  Psychiatric:        Mood and Affect: Mood normal.        Behavior: Behavior normal.     ED Results / Procedures / Treatments   Labs (all labs ordered are listed, but only abnormal results are displayed) Labs Reviewed  BASIC METABOLIC PANEL - Abnormal; Notable for the following components:      Result Value   Calcium 8.8 (*)    All other components within normal limits  TROPONIN I (HIGH SENSITIVITY) - Abnormal; Notable for the following components:   Troponin I (High Sensitivity) 22 (*)    All other components within normal limits  CBC  MAGNESIUM  TROPONIN I (HIGH SENSITIVITY)    EKG EKG Interpretation  Date/Time:  Friday May 01 2022 11:56:14 EDT Ventricular Rate:  77 PR Interval:  132 QRS Duration: 94 QT Interval:  370 QTC Calculation: 418 R Axis:   74 Text Interpretation: Normal sinus rhythm Normal ECG No old tracing to compare Confirmed by Deno Etienne [phone]) on 05/01/2022 1:11:08 PM  Radiology DG Chest 2  View  Result Date: 05/01/2022 CLINICAL DATA:  A 81 year old female presents with chest pain and LEFT arm and jaw pain. EXAM: CHEST - 2 VIEW COMPARISON:  March 20, 2014. FINDINGS: The heart size and mediastinal contours are within normal limits. Both lungs are clear. The visualized skeletal structures are unremarkable. IMPRESSION: No active cardiopulmonary disease. Electronically Signed   By: Zetta Bills M.D.   On: 05/01/2022 12:25    Procedures Procedures    Medications  Ordered in ED Medications - No data to display  ED Course/ Medical Decision Making/ A&P                           Medical Decision Making Amount and/or Complexity of Data Reviewed Labs: ordered. Radiology: ordered.   This patient is a 81 y.o. female who presents to the ED for concern of heart racing, tight neck and jaw sensation last night, this involves an extensive number of treatment options, and is a complaint that carries with it a high risk of complications and morbidity. The emergent differential diagnosis prior to evaluation includes, but is not limited to, ACS, PE, brief episode of SVT, A-fib or a flutter with RVR, other wide or narrow complex tachycardia.   This is not an exhaustive differential.   Past Medical History / Co-morbidities / Social History: factor V Leiden mutation, previous history of amaurosis fugax, previous history of syneresis bilateral eyes, as well as some chronic left-sided back pain and piriformis syndrome  Additional history: Chart reviewed. Pertinent results include: I was able to review previous PCP visits, not able to see previous troponin on file for patient  Physical Exam: Physical exam performed. The pertinent findings include: Well-appearing patient in no acute distress, normal heart rate and rhythm on my exam  Lab Tests: I ordered, and personally interpreted labs.  The pertinent results include: BMP unremarkable, very mild hypocalcemia calcium 8.8.  CBC unremarkable.  Initial  troponin 22 which is lower than reported 33 a PCP was morning, we cannot see that in our files we will repeat a delta troponin to see stable to decreasing numbers.  Magnesium pending at this time.   Imaging Studies: I ordered imaging studies including chest x-ray. I independently visualized and interpreted imaging which showed no acute abnormality. I agree with the radiologist interpretation.   Cardiac Monitoring:  The patient was maintained on a cardiac monitor.  My attending physician Dr. Tyrone Nine viewed and interpreted the cardiac monitored which showed an underlying rhythm of: NSR. I agree with this interpretation.  I discussed this case with my attending physician Dr. Tyrone Nine who cosigned this note including patient's presenting symptoms, physical exam, and planned diagnostics and interventions. Attending physician stated agreement with plan or made changes to plan which were implemented.   3:15 PM Care of Carlota Raspberry transferred to Resident Paternite and Dr. Maryan Rued at the end of my shift as the patient will require reassessment once labs/imaging have resulted. Patient presentation, ED course, and plan of care discussed with review of all pertinent labs and imaging. Please see his/her note for further details regarding further ED course and disposition. Plan at time of handoff is discharge with cards follow up if troponin stable to decreasing and magnesium not abnormal. This may be altered or completely changed at the discretion of the oncoming team pending results of further workup.    Final Clinical Impression(s) / ED Diagnoses Final diagnoses:  Racing heart beat  Elevated troponin    Rx / DC Orders ED Discharge Orders          Ordered    Ambulatory referral to Cardiology       Comments: Tachycardic episode with elevated Troponin -- presumed SVT that spontaneously resolved   05/01/22 1512              Magalene Mclear, [place], PA-C 05/01/22 [place], [place],  DO 05/01/22 1518

## 2022-05-01 NOTE — Discharge Instructions (Addendum)
As we discussed I am suspicious that you had an atypical heart rhythm leading to your rapid heart rate last night.  Because we have not replicated this on your EKG is difficult for me to diagnose exactly what kind of abnormal heart rhythm that you had.  My suspicion is that you had something called SVT which can occur for a number of reasons, occasionally have no clear cause, but may be linked to alcohol use, caffeine, electrolyte abnormalities, thyroid disease or other.  Today you are well-appearing with stable vitals and unremarkable lab work-up.  Your cardiac enzyme called troponin is trending down and was likely elevated because of your very rapid heart rate last night.  I see no evidence of heart attack and I have low suspicion for blood clot.  Until you speak with cardiology I recommend that you decrease your alcohol and caffeine use, as it can increase your risk of SVT or other abnormal heart rhythms.  I have given you follow-up information with cardiology, you should expect them to call you to schedule appointment sometime in the next week, if you have not heard from them I attached the contact information so that you can call and schedule an appointment

## 2022-05-01 NOTE — ED Provider Triage Note (Signed)
Emergency Medicine Provider Triage Evaluation Note  Regina [name] , a 81 y.o. female  was evaluated in triage.  Pt complains of CP since last night, from chest, up jaw and into neck. [facility], trop 33, sent here  No cardiac hx  Review of Systems  Positive: CP Negative: Sob, palpi  Physical Exam  BP (!) 152/102 (BP Location: Right Arm)   Pulse 85   Temp 98.2 F (36.8 C) (Oral)   Resp (!) 22   LMP 08/01/2014   SpO2 100%  Gen:   Awake, no distress   Resp:  Normal effort  MSK:   Moves extremities without difficulty  Other:  Anxious, rrr, ctab  Medical Decision Making  Medically screening exam initiated at 12:20 PM.  Appropriate orders placed.  TONILYNN BIEKER was informed that the remainder of the evaluation will be completed by another provider, this initial triage assessment does not replace that evaluation, and the importance of remaining in the ED until their evaluation is complete.     Rhae Hammock, PA-C 05/01/22 1222

## 2022-05-02 NOTE — Progress Notes (Unsigned)
Cardiology Office Note:    Date:  05/04/2022   ID:  Regina, [name] 1941/02/11, MRN [id number]  PCP:  Cari Caraway, MD  Cardiologist:  None  Electrophysiologist:  None   Referring MD: Cari Caraway, MD   Chief Complaint  Patient presents with   Palpitations    History of Present Illness:    Regina [name] is a 81 y.o. female with a hx of factor V Leiden who presents as an ED follow-up for palpitations.  She was seen in the ED on 05/01/2022 with palpitations.  Labs notable for troponin 22 > 19.  She reports a single episode of palpitations that occurred the night she went to the ED.  Reports felt like heart was racing, lasted 30 minutes.  Reports since that time she has been having chest discomfort that she describes as dull aching pain on left side of chest.  She denies any dyspnea, lightheadedness, syncope, lower extremity edema.  She does tai chi twice per week for 1 hour.  She smoked almost 3 packs/day x 5 years, quit in 1974.  Family history includes mother had TIA in the 39s, maternal grandmother died of MI in 25s.   Past Medical History:  Diagnosis Date   Headache    Nervous system disorder    spinal cord tethered   Osteoporosis     Past Surgical History:  Procedure Laterality Date   PARTIAL HYSTERECTOMY  1996   TUBAL LIGATION      Current Medications: Current Meds  Medication Sig   Ascorbic Acid (VITAMIN C) 1000 MG tablet Take 1,000 mg by mouth daily.    aspirin 81 MG EC tablet Take by mouth 2 (two) times a week.   b complex vitamins capsule Take 1 capsule by mouth daily.   calcium citrate-vitamin D (CITRACAL+D) 315-200 MG-UNIT tablet Take 1 tablet by mouth every morning.   Cholecalciferol (VITAMIN D3) 2000 units TABS Take 2,000 Units by mouth daily.   CHROMIUM PO Take 200 mg by mouth daily.   Fexofenadine HCl (MUCINEX ALLERGY PO) Take 1 tablet by mouth daily.   gabapentin (NEURONTIN) 300 MG capsule Take 300 mg by mouth daily in the afternoon.    Loratadine 10 MG CAPS Take 1 capsule by mouth daily at 6 (six) AM. Joline Salt brand   Lutein 6 MG CAPS Take 1 capsule by mouth with breakfast, with lunch, and with evening meal.   Lysine 1000 MG TABS Take 1 tablet by mouth daily.   Magnesium 500 MG CAPS Take 1 capsule by mouth daily.   metoprolol tartrate (LOPRESSOR) 25 MG tablet Take 1 tablet (25 mg) TWO hours prior to CT scan   mirabegron ER (MYRBETRIQ) 50 MG TB24 tablet Take 1 tablet by mouth daily.   montelukast (SINGULAIR) 10 MG tablet Take 1 tablet (10 mg total) by mouth at bedtime.   Multiple Vitamins-Minerals (WOMENS BONE HEALTH PO) Take 1 tablet by mouth daily.   Omega-3 Fatty Acids (OMEGA 3 500 PO) Take 1 tablet by mouth daily at 6 (six) AM.   omeprazole (PRILOSEC) 40 MG capsule Take 40 mg by mouth 2 (two) times daily.   Zinc Sulfate (ZINC-220 PO) Take 1 tablet by mouth daily at 6 (six) AM.     Allergies:   Other, Ketoprofen, Morphine and related, Tramadol, Oxycodone, and Venlafaxine   Social History   Socioeconomic History   Marital status: Married    Spouse name: John   Number of children: 0   Years of education: Some  colle   Highest education level: Not on file  Occupational History   Occupation: Self employed  Tobacco Use   Smoking status: Former    Packs/day: 3.00    Years: 4.00    Total pack years: 12.00    Types: Cigarettes    Quit date: 12/10/1972    Years since quitting: 49.4   Smokeless tobacco: Never  Substance and Sexual Activity   Alcohol use: Yes    Alcohol/week: 0.0 standard drinks of alcohol    Comment: 2-3 glasses wine/week   Drug use: No   Sexual activity: Not on file  Other Topics Concern   Not on file  Social History Narrative   Lives w/ husband    Caffeine use: 2-3 cups coffee per day   Social Determinants of Health   Financial Resource Strain: Not on file  Food Insecurity: Not on file  Transportation Needs: Not on file  Physical Activity: Not on file  Stress: Not on file  Social  Connections: Not on file     Family History: The patient's family history includes Asthma in her mother; COPD in her mother; Cancer in her paternal grandmother; Heart attack in her maternal grandfather and maternal grandmother; Stroke in her mother. There is no history of Migraines.  ROS:   Please see the history of present illness.     All other systems reviewed and are negative.  EKGs/Labs/Other Studies Reviewed:    The following studies were reviewed today:   EKG:   05/04/22: Normal sinus rhythm, rate 69, no ST abnormalities  Recent Labs: 05/01/2022: BUN 15; Creatinine, Ser 0.67; Hemoglobin 13.5; Magnesium 1.9; Platelets 211; Potassium 3.9; Sodium 141  Recent Lipid Panel No results found for: "CHOL", "TRIG", "HDL", "CHOLHDL", "VLDL", "LDLCALC", "LDLDIRECT"  Physical Exam:    VS:  BP 93/62 (BP Location: Left Arm, Patient Position: Sitting, Cuff Size: Normal)   Pulse 69   Ht 5' 3.5" (1.613 m)   Wt 121 lb (54.9 kg)   LMP 08/01/2014   SpO2 98%   BMI 21.10 kg/m     Wt Readings from Last 3 Encounters:  05/04/22 121 lb (54.9 kg)  02/11/22 120 lb (54.4 kg)  02/17/21 137 lb (62.1 kg)     GEN: in no acute distress HEENT: Normal NECK: No JVD; No carotid bruits LYMPHATICS: No lymphadenopathy CARDIAC: RRR, no murmurs, rubs, gallops RESPIRATORY:  Clear to auscultation without rales, wheezing or rhonchi  ABDOMEN: Soft, non-tender, non-distended MUSCULOSKELETAL:  No edema; No deformity  SKIN: Warm and dry NEUROLOGIC:  Alert and oriented x 3 PSYCHIATRIC:  Normal affect   ASSESSMENT:    1. Chest pain of uncertain etiology   2. Palpitations   3. Hyperlipidemia, unspecified hyperlipidemia type    PLAN:    Chest pain: Atypical in description but does have CAD risk factors (age, tobacco use).  Recommend coronary CTA to rule out obstructive CAD.  Will give Lopressor 25 mg prior to study.  Recommend echocardiogram to rule out structural heart disease.  Palpitations: Description  concerning for arrhythmia, will evaluate with Zio patch x2 weeks  Hyperlipidemia: LDL 90 on 04/15/2021.  Will follow-up results of coronary CTA to guide how aggressive to be in lowering cholesterol  RTC in 6 months  Medication Adjustments/Labs and Tests Ordered: Current medicines are reviewed at length with the patient today.  Concerns regarding medicines are outlined above.  Orders Placed This Encounter  Procedures   CT CORONARY MORPH W/CTA COR W/SCORE W/CA W/CM &/OR WO/CM   LONG  TERM MONITOR (3-14 DAYS)   EKG 12-Lead   ECHOCARDIOGRAM COMPLETE   Meds ordered this encounter  Medications   metoprolol tartrate (LOPRESSOR) 25 MG tablet    Sig: Take 1 tablet (25 mg) TWO hours prior to CT scan    Dispense:  1 tablet    Refill:  0    Patient Instructions  Medication Instructions:  Your physician recommends that you continue on your current medications as directed. Please refer to the Current Medication list given to you today.  *If you need a refill on your cardiac medications before your next appointment, please call your pharmacy*  Testing/Procedures: Your physician has requested that you have an echocardiogram. Echocardiography is a painless test that uses sound waves to create images of your heart. It provides your doctor with information about the size and shape of your heart and how well your heart's chambers and valves are working. This procedure takes approximately one hour. There are no restrictions for this procedure.  Coronary CTA-see instructions below  ZIO XT- Long Term Monitor Instructions   Your physician has requested you wear a ZIO patch monitor for _14_ days.  This is a single patch monitor.   [employer] supplies one patch monitor per enrollment. Additional stickers are not available. Please do not apply patch if you will be having a Nuclear Stress Test, Echocardiogram, Cardiac CT, MRI, or Chest Xray during the period you would be wearing the monitor. The patch cannot be  worn during these tests. You cannot remove and re-apply the ZIO XT patch monitor.  Your ZIO patch monitor will be sent Fed Ex from [employer] directly to your home address. It may take 3-5 days to receive your monitor after you have been enrolled.  Once you have received your monitor, please review the enclosed instructions. Your monitor has already been registered assigning a specific monitor serial # to you.  Billing and Patient Assistance Program Information   We have supplied [employer] with any of your insurance information on file for billing purposes. [employer] offers a sliding scale Patient Assistance Program for patients that do not have insurance, or whose insurance does not completely cover the cost of the ZIO monitor.   You must apply for the Patient Assistance Program to qualify for this discounted rate.     To apply, please call [employer] at [phone], select option 4, then select option 2, and ask to apply for Patient Assistance Program.  Theodore Demark will ask your household income, and how many people are in your household.  They will quote your out-of-pocket cost based on that information.  [employer] will also be able to set up a 44-month interest-free payment plan if needed.  Applying the monitor   Shave hair from upper left chest.  Hold abrader disc by orange tab. Rub abrader in 40 strokes over the upper left chest as indicated in your monitor instructions.  Clean area with 4 enclosed alcohol pads. Let dry.  Apply patch as indicated in monitor instructions. Patch will be placed under collarbone on left side of chest with arrow pointing upward.  Rub patch adhesive wings for 2 minutes. Remove white label marked "1". Remove the white label marked "2". Rub patch adhesive wings for 2 additional minutes.  While looking in a mirror, press and release button in center of patch. A small green light will flash 3-4 times. This will be your only indicator that the monitor has been turned on. ?   Do not shower for the first 24  hours. You may shower after the first 24 hours.  Press the button if you feel a symptom. You will hear a small click. Record Date, Time and Symptom in the Patient Logbook.  When you are ready to remove the patch, follow instructions on the last 2 pages of the Patient Logbook. Stick patch monitor onto the last page of Patient Logbook.  Place Patient Logbook in the blue and white box.  Use locking tab on box and tape box closed securely.  The blue and white box has prepaid postage on it. Please place it in the mailbox as soon as possible. Your physician should have your test results approximately 7 days after the monitor has been mailed back to [facility].  Call [place] at [phone] if you have questions regarding your ZIO XT patch monitor. Call them immediately if you see an orange light blinking on your monitor.  If your monitor falls off in less than 4 days, contact our Monitor department at [phone]. ?If your monitor becomes loose or falls off after 4 days call [employer] at [phone] for suggestions on securing your monitor.?  Follow-Up: At [facility], you and your health needs are our priority.  As part of our continuing mission to provide you with exceptional heart care, we have created designated Provider Care Teams.  These Care Teams include your primary Cardiologist (physician) and Advanced Practice Providers (APPs -  Physician Assistants and Nurse Practitioners) who all work together to provide you with the care you need, when you need it.  We recommend signing up for the patient portal called "MyChart".  Sign up information is provided on this After Visit Summary.  MyChart is used to connect with patients for Virtual Visits (Telemedicine).  Patients are able to view lab/test results, encounter notes, upcoming appointments, etc.  Non-urgent messages can be sent to your provider as well.   To learn more about what you can do  with MyChart, go to [email or web address].    Your next appointment:   6 month(s)  The format for your next appointment:   In Person  Provider:   Dr. Gardiner Rhyme  Other Instructions   Your cardiac CT will be scheduled at one of the below locations:   [facility] [address] [place], [place] [address] [phone]  If scheduled at [facility], please arrive at the [facility] and Children's Entrance (Entrance C2) of [facility] 30 minutes prior to test start time. You can use the FREE valet parking offered at entrance C (encouraged to control the heart rate for the test)  Proceed to the [facility] Radiology Department (first floor) to check-in and test prep.  All radiology patients and guests should use entrance C2 at [facility], accessed from [facility], even though the hospital's physical address listed is [address]    Please follow these instructions carefully (unless otherwise directed):  On the Night Before the Test: Be sure to Drink plenty of water. Do not consume any caffeinated/decaffeinated beverages or chocolate 12 hours prior to your test. Do not take any antihistamines 12 hours prior to your test.  On the Day of the Test: Drink plenty of water until 1 hour prior to the test. Do not eat any food 4 hours prior to the test. You may take your regular medications prior to the test.  Take metoprolol (Lopressor) 25 mg two hours prior to test. FEMALES- please wear underwire-free bra if available, avoid dresses &  tight clothing      After the Test: Drink plenty of water. After receiving IV contrast, you may experience a mild flushed feeling. This is normal. On occasion, you may experience a mild rash up to 24 hours after the test. This is not dangerous. If this occurs, you can take Benadryl 25 mg and increase your fluid intake. If you experience trouble breathing, this can be serious. If it is severe call  911 IMMEDIATELY. If it is mild, please call our office. If you take any of these medications: Glipizide/Metformin, Avandament, Glucavance, please do not take 48 hours after completing test unless otherwise instructed.  We will call to schedule your test 2-4 weeks out understanding that some insurance companies will need an authorization prior to the service being performed.   For non-scheduling related questions, please contact the cardiac imaging nurse navigator should you have any questions/concerns: Marchia Bond, Cardiac Imaging Nurse Navigator Gordy Clement, Cardiac Imaging Nurse Navigator [facility] Direct Office Dial: [phone]   For scheduling needs, including cancellations and rescheduling, please call [place], [phone].            Signed, Donato Heinz, MD  05/04/2022 2:56 PM    [place]

## 2022-05-04 ENCOUNTER — Ambulatory Visit (INDEPENDENT_AMBULATORY_CARE_PROVIDER_SITE_OTHER): Payer: PPO | Admitting: Cardiology

## 2022-05-04 ENCOUNTER — Encounter: Payer: Self-pay | Admitting: Cardiology

## 2022-05-04 ENCOUNTER — Ambulatory Visit (INDEPENDENT_AMBULATORY_CARE_PROVIDER_SITE_OTHER): Payer: PPO

## 2022-05-04 VITALS — BP 93/62 | HR 69 | Ht 63.5 in | Wt 121.0 lb

## 2022-05-04 DIAGNOSIS — R079 Chest pain, unspecified: Secondary | ICD-10-CM | POA: Diagnosis not present

## 2022-05-04 DIAGNOSIS — R002 Palpitations: Secondary | ICD-10-CM

## 2022-05-04 DIAGNOSIS — E785 Hyperlipidemia, unspecified: Secondary | ICD-10-CM | POA: Diagnosis not present

## 2022-05-04 MED ORDER — METOPROLOL TARTRATE 25 MG PO TABS: ORAL_TABLET | ORAL | 0 refills | Status: DC

## 2022-05-04 NOTE — Progress Notes (Unsigned)
Enrolled for [employer] to mail a ZIO XT long term holter monitor to the patients address on file.  

## 2022-05-04 NOTE — Patient Instructions (Signed)
Medication Instructions:  Your physician recommends that you continue on your current medications as directed. Please refer to the Current Medication list given to you today.  *If you need a refill on your cardiac medications before your next appointment, please call your pharmacy*  Testing/Procedures: Your physician has requested that you have an echocardiogram. Echocardiography is a painless test that uses sound waves to create images of your heart. It provides your doctor with information about the size and shape of your heart and how well your heart's chambers and valves are working. This procedure takes approximately one hour. There are no restrictions for this procedure.  Coronary CTA-see instructions below  ZIO XT- Long Term Monitor Instructions   Your physician has requested you wear a ZIO patch monitor for _14_ days.  This is a single patch monitor.   [employer] supplies one patch monitor per enrollment. Additional stickers are not available. Please do not apply patch if you will be having a Nuclear Stress Test, Echocardiogram, Cardiac CT, MRI, or Chest Xray during the period you would be wearing the monitor. The patch cannot be worn during these tests. You cannot remove and re-apply the ZIO XT patch monitor.  Your ZIO patch monitor will be sent Fed Ex from [employer] directly to your home address. It may take 3-5 days to receive your monitor after you have been enrolled.  Once you have received your monitor, please review the enclosed instructions. Your monitor has already been registered assigning a specific monitor serial # to you.  Billing and Patient Assistance Program Information   We have supplied [employer] with any of your insurance information on file for billing purposes. [employer] offers a sliding scale Patient Assistance Program for patients that do not have insurance, or whose insurance does not completely cover the cost of the ZIO monitor.   You must apply for the  Patient Assistance Program to qualify for this discounted rate.     To apply, please call [employer] at [phone], select option 4, then select option 2, and ask to apply for Patient Assistance Program.  Theodore Demark will ask your household income, and how many people are in your household.  They will quote your out-of-pocket cost based on that information.  [employer] will also be able to set up a 7-month interest-free payment plan if needed.  Applying the monitor   Shave hair from upper left chest.  Hold abrader disc by orange tab. Rub abrader in 40 strokes over the upper left chest as indicated in your monitor instructions.  Clean area with 4 enclosed alcohol pads. Let dry.  Apply patch as indicated in monitor instructions. Patch will be placed under collarbone on left side of chest with arrow pointing upward.  Rub patch adhesive wings for 2 minutes. Remove white label marked "1". Remove the white label marked "2". Rub patch adhesive wings for 2 additional minutes.  While looking in a mirror, press and release button in center of patch. A small green light will flash 3-4 times. This will be your only indicator that the monitor has been turned on. ?  Do not shower for the first 24 hours. You may shower after the first 24 hours.  Press the button if you feel a symptom. You will hear a small click. Record Date, Time and Symptom in the Patient Logbook.  When you are ready to remove the patch, follow instructions on the last 2 pages of the Patient Logbook. Stick patch monitor onto the last page of Patient Logbook.  Place Patient Logbook in the blue and white box.  Use locking tab on box and tape box closed securely.  The blue and white box has prepaid postage on it. Please place it in the mailbox as soon as possible. Your physician should have your test results approximately 7 days after the monitor has been mailed back to [facility].  Call [place] at [phone] if you have  questions regarding your ZIO XT patch monitor. Call them immediately if you see an orange light blinking on your monitor.  If your monitor falls off in less than 4 days, contact our Monitor department at [phone]. ?If your monitor becomes loose or falls off after 4 days call [employer] at [phone] for suggestions on securing your monitor.?  Follow-Up: At [facility], you and your health needs are our priority.  As part of our continuing mission to provide you with exceptional heart care, we have created designated Provider Care Teams.  These Care Teams include your primary Cardiologist (physician) and Advanced Practice Providers (APPs -  Physician Assistants and Nurse Practitioners) who all work together to provide you with the care you need, when you need it.  We recommend signing up for the patient portal called "MyChart".  Sign up information is provided on this After Visit Summary.  MyChart is used to connect with patients for Virtual Visits (Telemedicine).  Patients are able to view lab/test results, encounter notes, upcoming appointments, etc.  Non-urgent messages can be sent to your provider as well.   To learn more about what you can do with MyChart, go to [email or web address].    Your next appointment:   6 month(s)  The format for your next appointment:   In Person  Provider:   Dr. Gardiner Rhyme  Other Instructions   Your cardiac CT will be scheduled at one of the below locations:   [facility] [address] [place], [place] [address] [phone]  If scheduled at [facility], please arrive at the [facility] and Children's Entrance (Entrance C2) of [facility] 30 minutes prior to test start time. You can use the FREE valet parking offered at entrance C (encouraged to control the heart rate for the test)  Proceed to the [facility] Radiology Department (first floor) to check-in and test prep.  All radiology patients and guests should use  entrance C2 at [facility], accessed from [facility], even though the hospital's physical address listed is [address]    Please follow these instructions carefully (unless otherwise directed):  On the Night Before the Test: Be sure to Drink plenty of water. Do not consume any caffeinated/decaffeinated beverages or chocolate 12 hours prior to your test. Do not take any antihistamines 12 hours prior to your test.  On the Day of the Test: Drink plenty of water until 1 hour prior to the test. Do not eat any food 4 hours prior to the test. You may take your regular medications prior to the test.  Take metoprolol (Lopressor) 25 mg two hours prior to test. FEMALES- please wear underwire-free bra if available, avoid dresses & tight clothing      After the Test: Drink plenty of water. After receiving IV contrast, you may experience a mild flushed feeling. This is normal. On occasion, you may experience a mild rash up to 24 hours after the test. This is not dangerous. If this occurs, you can take Benadryl 25 mg and increase your fluid intake. If you  experience trouble breathing, this can be serious. If it is severe call 911 IMMEDIATELY. If it is mild, please call our office. If you take any of these medications: Glipizide/Metformin, Avandament, Glucavance, please do not take 48 hours after completing test unless otherwise instructed.  We will call to schedule your test 2-4 weeks out understanding that some insurance companies will need an authorization prior to the service being performed.   For non-scheduling related questions, please contact the cardiac imaging nurse navigator should you have any questions/concerns: Marchia Bond, Cardiac Imaging Nurse Navigator Gordy Clement, Cardiac Imaging Nurse Navigator [facility] Direct Office Dial: [phone]   For scheduling needs, including cancellations and rescheduling, please call  [place], [phone].

## 2022-05-04 NOTE — Therapy (Signed)
OUTPATIENT PHYSICAL THERAPY TREATMENT NOTE   Patient Name: Regina [name] MRN: [id number] DOB:04/12/1941, 81 y.o., female Today's Date: 05/05/2022  PCP: [name], MD REFERRING PROVIDER: Christena Deem, MD     END OF SESSION:   PT End of Session - 05/05/22 1358     Visit Number 11    Number of Visits 14    Date for PT Re-Evaluation 05/27/22    Authorization Type HTA    Progress Note Due on Visit 20    PT Start Time 1315    PT Stop Time 1357    PT Time Calculation (min) 42 min    Activity Tolerance Patient tolerated treatment well    Behavior During Therapy WFL for tasks assessed/performed                Past Medical History:  Diagnosis Date   Headache    Nervous system disorder    spinal cord tethered   Osteoporosis    Past Surgical History:  Procedure Laterality Date   PARTIAL HYSTERECTOMY  1996   TUBAL LIGATION     Patient Active Problem List   Diagnosis Date Noted   Left buttock pain 11/14/2020   Myofascial pain 11/14/2020   Piriformis syndrome of left side 05/07/2020   Family history of glaucoma 03/29/2019   Meibomian gland dysfunction (MGD) of both eyes 03/29/2019   Vitreous syneresis of both eyes 03/29/2019   Open angle with borderline findings and low glaucoma risk in both eyes 03/02/2018   Dysphonia 01/03/2018   Post-nasal drainage 01/03/2018   Heterozygous factor V Leiden mutation (HCC) 05/28/2016   Amaurosis fugax of left eye 05/28/2016   Migraine aura without headache 04/13/2016   Dermatochalasis of both upper eyelids 08/01/2015   Asthma, mild intermittent 06/05/2014   Cough 03/20/2014   Lipoma 02/22/2013    REFERRING DIAG: Q06.8 (ICD-10-CM) - Other specified congenital malformations of spinal cord  THERAPY DIAG:  Muscle weakness (generalized)  Other low back pain  Rationale for Evaluation and Treatment Rehabilitation  PERTINENT HISTORY:  hx of piriformis pain, myofascial pain, migraine, asthma, lipoma at base of  spine  PRECAUTIONS: none  SUBJECTIVE:  Had bone density testing which showed severe osteoporosis- asking about suggestions on this. Was in the ED on Friday for heart palpitations but has not had any more episodes of this.   PAIN:   Are you having pain? No: NPRS scale: 0.5/10 Pain location: L buttocks Pain description: sore Aggravating factors: sleeping positions Relieving factors: ice, moving around in the day     Occasional pain in R thigh  OBJECTIVE:     TODAY'S TREATMENT: 05/05/22 Activity Comments  sitting hip IR with ball 2x10 Unable to try with yellow TB d/t L knee pain   sitting LAQ red/yellow TB Unable to perform d/t L knee pain   clamshell red TB 2x10 Cues to roll anteriorly  Runner's gastroc stretch 30" each   B heel raise x15  Slightly limited ROM on L  B conc, L eccentric heel raise 10x Good effort to control eccentric phase; rest break after rep 7  TKE red TB 10x3" 1 episode of L knee crepitus   short slow step backs L LE 10x from 4" step B handrail use; apprehension to unlock knee    PATIENT EDUCATION: Education details: answered patient's questions on WBing exercise for increased bone density and advised to discontinue hopping/dropping down on bottom to avoid risk of injury; update to HEP Person educated: Patient Education method: Explanation and  Demonstration Education comprehension: verbalized understanding and returned demonstration    HOME EXERCISE PROGRAM Last updated: 04/29/22 Access Code: [email or web address] URL: [email or web address] Date: 04/29/2022 Prepared by: [facility] - Outpatient  Rehab - [facility]  Exercises - Standing Lumbar Spine Flexion Stretch Counter  - 1 x daily - 7 x weekly - 1 sets - 5-10 reps - Bird Dog  - 1 x daily - 5 x weekly - 2 sets - 10 reps - Sidelying Hip Abduction  - 1 x daily - 5 x weekly - 2 sets - 10 reps - Staggered Sit-to-Stand  - 1 x daily - 5 x weekly - 2 sets - 10 reps - Seated Hamstring Stretch  - 2 x  daily - 7 x weekly - 1 sets - 3 reps - 30 sec hold - Seated Hip Adduction Isometrics with Ball  - 1 x daily - 7 x weekly - 3 sets - 10 reps - Seated Isometric Hip Abduction with Belt  - 1 x daily - 7 x weekly - 3 sets - 10 reps - Gastroc Stretch on Wall  - 1 x daily - 5 x weekly - 2 sets - 10 reps - Heel Raises with Counter Support  - 1 x daily - 5 x weekly - 2 sets - 10 reps - Seated Hip Internal Rotation with Ball and Resistance  - 1 x daily - 5 x weekly - 2 sets - 10 reps  PATIENT EDUCATION: Education details: discussion on objective progress and remaining impairments as well as new goals with therapy; HEP update Person educated: Patient Education method: Explanation, Demonstration, Tactile cues, Verbal cues, and Handouts Education comprehension: verbalized understanding and returned demonstration   ___________________________________________________________ (objective measures completed at initial evaluation unless otherwise dated)  From eval 03/16/2022:     DIAGNOSTIC FINDINGS: MRI 2021 lumbar spine   COGNITION: Overall cognitive status: Within functional limits for tasks assessed             SENSATION: Numbness in R distal/lateral thigh     MUSCLE LENGTH: Hamstrings: Right -20 deg from neutral; Left -12 deg from neutral SLR test:  Right 80 degrees with pain; Left 90 degrees   POSTURE: anterior pelvic tilt and appears to have some hypermobility through spine/pelvis in supine   LOWER EXTREMITY ROM:      Active  Right Eval Left Eval Right 04/29/22 Left 04/29/22  Ankle dorsiflexion neutral -3 degrees from neutral 0 -2  Hip IR   27 28 *c/o pulling  Hip ER   35 37   (Blank rows = not tested)   LOWER EXTREMITY MMT:    MMT Right Eval Left Eval Right 04/29/22  Left 04/29/22   Hip flexion   4+ 4+  Hip extension      Hip abduction   4+ 4+  Hip adduction   4+ 4+  Hip internal rotation   4 4  Hip external rotation   4 4  Knee flexion 4+ 4 4+ 4  Knee extension 5  4+ 5 4  Ankle dorsiflexion 4 4 4+ 4+  Ankle plantarflexion   5 2+  Ankle inversion      Ankle eversion      (Blank rows = not tested)  TRANSFERS: Assistive device utilized: None  Sit to stand: Complete Independence Stand to sit: Complete Independence     FUNCTIONAL TESTs:  PALPATION:   Pt tender to palpation along PSIS bilaterally.  Pt tender at piriformis area. FUNCTIONAL MOVEMENTS in supine:  pelvic tilt:  posterior-no increase in pain, anterior-"pulling in back"; single knee to chest-no increase in pain     TODAY'S TREATMENT:  Initiated HEP-see below     PATIENT EDUCATION: Education details: PT eval results, POC, initiated HEP; initially discussed use of ice massage, need to "calm down" the PSIS/buttock area and allow for core stability, painfree mobility/positioning in sleeping Person educated: Patient Education method: Explanation, Demonstration, and Handouts Education comprehension: verbalized understanding and returned demonstration     HOME EXERCISE PROGRAM: Access Code: [email or web address] URL: [email or web address] Date: 03/16/2022 Prepared by: [facility] - Outpatient  Rehab - [facility]   Exercises - Hooklying Single Knee to Chest Stretch  - 1 x daily - 7 x weekly - 1 sets - 3-5 reps - Supine Posterior Pelvic Tilt  - 1 x daily - 7 x weekly - 1-2 sets - 5-10 reps   _______________________________________________________________________    GOALS: Goals reviewed with patient? Yes   SHORT TERM GOALS: Target date: 04/14/2022   Pt will be independent with HEP for improved core stability, decreased pain during sleeping hours. Baseline: Goal status: GOAL ACHIEVED   2.  Pt will report decrease in pain by at least 50% during night time hours. Baseline: Reports 7-8/10; reports maybe 20% improvement, 6-7/10; 04/29/22 reports some improvement in sleeping tolerance Goal status: NOT MET, progressing     LONG TERM GOALS: Target date: 05/27/2022   Pt will be  independent with HEP for improved core stability, lower extremity strength, and sleep positioning and decreased pain. Baseline:  Goal status: IN PROGRESS   2.  Pt will report decrease in pain by at least 75% during night time hours. Baseline: 04/29/22 reports some improvement in pain levels with sleeping Goal status: IN PROGRESS   3.  Pt will verbalize understanding of ways to manage/control low back pain and positioning/body mechanics. Baseline:  Goal status: MET  4.  Pt will report tolerance for reciprocal stair navigation with 50% improvement in stability. Baseline: performing step-to pattern Goal status: IN PROGRESS  5.  Pt will report 50% improvement ability to perform "Embrace tiger and return to the mountain" pose in tai-chi d/t improved hip ROM. Baseline: performing step-to pattern Goal status: IN PROGRESS     ASSESSMENT:   CLINICAL IMPRESSION: Patient arrived to session with report of getting a report of poor bone density and asking for clarity on appropriate exercise to address this. Also reports an ED visit on 05/01/22 for heart palpitations- already seen by cardiology. Worked on progressive LE strengthening however with poor tolerance for open chain activities d/t L knee pain. Patient did tolerate hip ER strengthening activities with resistance well today. Also able to progress calf strengthening with good effort to control eccentric phase. Patient reported understanding of HEP update and without complaints at end of session.   OBJECTIVE IMPAIRMENTS decreased ROM, increased muscle spasms, impaired flexibility, and pain.    ACTIVITY LIMITATIONS sleeping and bed mobility   PARTICIPATION LIMITATIONS: community activity   PERSONAL FACTORS hx of piriformis pain, myofascial pain, migraine, asthma, lipoma at base of spine are also affecting patient's functional outcome.    REHAB POTENTIAL: Good   CLINICAL DECISION MAKING: Evolving/moderate complexity   EVALUATION  COMPLEXITY: Moderate   PLAN: PT FREQUENCY: 1x/week   PT DURATION: 4 weeks    PLANNED INTERVENTIONS: Therapeutic exercises, Therapeutic activity, Neuromuscular re-education, Balance training, Gait training, Patient/Family education, Joint mobilization, Aquatic Therapy, Electrical stimulation, Cryotherapy, Moist heat, Ultrasound, and Manual therapy, Dry Needling, Canalith Repositioning, Vestibular Rehab   PLAN FOR NEXT  SESSION:  progress L calf and quad strength, hip ROM   Anette Guarneri, PT, DPT 05/05/22 1:58 PM  [facility] at [facility] [address] [place], [address] [place], [place] [address] Phone # [phone] Fax # [phone]

## 2022-05-05 ENCOUNTER — Ambulatory Visit: Payer: PPO | Admitting: Physical Therapy

## 2022-05-05 ENCOUNTER — Encounter: Payer: Self-pay | Admitting: Physical Therapy

## 2022-05-05 DIAGNOSIS — M5459 Other low back pain: Secondary | ICD-10-CM | POA: Diagnosis not present

## 2022-05-05 DIAGNOSIS — M6281 Muscle weakness (generalized): Secondary | ICD-10-CM

## 2022-05-06 DIAGNOSIS — R002 Palpitations: Secondary | ICD-10-CM

## 2022-05-13 NOTE — Therapy (Signed)
OUTPATIENT PHYSICAL THERAPY DISCHARGE NOTE   Patient Name: Regina [name] MRN: [id number] DOB:1940-10-15, 81 y.o., female Today's Date: 05/15/2022  PCP: Cari Caraway, MD REFERRING PROVIDER: Inez Catalina, MD     END OF SESSION:   PT End of Session - 05/15/22 1010     Visit Number 12    Number of Visits 14    Date for PT Re-Evaluation 05/27/22    Authorization Type HTA    Progress Note Due on Visit 52    PT Start Time [phone]   pt late   PT Stop Time 1010    PT Time Calculation (min) 32 min    Activity Tolerance Patient tolerated treatment well    Behavior During Therapy [facility] for tasks assessed/performed                 Past Medical History:  Diagnosis Date   Headache    Nervous system disorder    spinal cord tethered   Osteoporosis    Past Surgical History:  Procedure Laterality Date   PARTIAL HYSTERECTOMY  1996   TUBAL LIGATION     Patient Active Problem List   Diagnosis Date Noted   Left buttock pain 11/14/2020   Myofascial pain 11/14/2020   Piriformis syndrome of left side 05/07/2020   Family history of glaucoma 03/29/2019   Meibomian gland dysfunction (MGD) of both eyes 03/29/2019   Vitreous syneresis of both eyes 03/29/2019   Open angle with borderline findings and low glaucoma risk in both eyes 03/02/2018   Dysphonia 01/03/2018   Post-nasal drainage 01/03/2018   Heterozygous factor V Leiden mutation ([place]) 05/28/2016   Amaurosis fugax of left eye 05/28/2016   Migraine aura without headache 04/13/2016   Dermatochalasis of both upper eyelids 08/01/2015   Asthma, mild intermittent 06/05/2014   Cough 03/20/2014   Lipoma 02/22/2013    REFERRING DIAG: Q06.8 (ICD-10-CM) - Other specified congenital malformations of spinal cord  THERAPY DIAG:  Muscle weakness (generalized)  Other low back pain  Rationale for Evaluation and Treatment Rehabilitation  PERTINENT HISTORY:  hx of piriformis pain, myofascial pain, migraine, asthma, lipoma at base  of spine  PRECAUTIONS: none  SUBJECTIVE:  Had a few good nights with her back, one bad one but it fluctuates. Continues to work on her HEP. Leaving for [place] for a month on Sunday.   PAIN:   Are you having pain? No  OBJECTIVE:    TODAY'S TREATMENT: 05/15/22 Activity Comments  straight leg bridge on red pball 2x10 Core instability evidenced by excessive movement of pball  L LE bridge 2x5 Limited ROM; cues to contract glutes to avoid HS cramp  L HS curl 30" To address HS spasm  bird dog 2x10 R hip drop d/t L weakness   Stairs  2x with alternating reciprocal pattern and 1 handrail  Limited eccentric control on L LE      HOME EXERCISE PROGRAM Last updated: 05/15/22 Access Code: [email or web address] URL: [email or web address] Date: 05/15/2022 Prepared by: [facility]  Exercises - Bird Dog  - 1 x daily - 5 x weekly - 2 sets - 10 reps - Sidelying Hip Abduction  - 1 x daily - 5 x weekly - 2 sets - 10 reps - Staggered Sit-to-Stand  - 1 x daily - 5 x weekly - 2 sets - 10 reps - Gastroc Stretch on Wall  - 1 x daily - 5 x weekly - 2 sets - 10 reps - Seated Hip  Internal Rotation with Diona Foley and Resistance  - 1 x daily - 5 x weekly - 2 sets - 10 reps - Clamshell with Resistance  - 1 x daily - 5 x weekly - 2 sets - 10 reps - Heel Raises with Counter Support  - 1 x daily - 5 x weekly - 2 sets - 10 reps  PATIENT EDUCATION: Education details: consolidation of HEP and edu on recommended exercise frequency; and discussion on progress with goals  Person educated: Patient Education method: Explanation, Demonstration, Tactile cues, Verbal cues, and Handouts Education comprehension: verbalized understanding and returned demonstration   ___________________________________________________________ (objective measures completed at initial evaluation unless otherwise dated)  From eval 03/16/2022:     DIAGNOSTIC FINDINGS: MRI 2021 lumbar spine    COGNITION: Overall cognitive status: Within functional limits for tasks assessed             SENSATION: Numbness in R distal/lateral thigh     MUSCLE LENGTH: Hamstrings: Right -20 deg from neutral; Left -12 deg from neutral SLR test:  Right 80 degrees with pain; Left 90 degrees   POSTURE: anterior pelvic tilt and appears to have some hypermobility through spine/pelvis in supine   LOWER EXTREMITY ROM:      Active  Right Eval Left Eval Right 04/29/22 Left 04/29/22  Ankle dorsiflexion neutral -3 degrees from neutral 0 -2  Hip IR   27 28 *c/o pulling  Hip ER   35 37   (Blank rows = not tested)   LOWER EXTREMITY MMT:    MMT Right Eval Left Eval Right 04/29/22  Left 04/29/22   Hip flexion   4+ 4+  Hip extension      Hip abduction   4+ 4+  Hip adduction   4+ 4+  Hip internal rotation   4 4  Hip external rotation   4 4  Knee flexion 4+ 4 4+ 4  Knee extension 5 4+ 5 4  Ankle dorsiflexion 4 4 4+ 4+  Ankle plantarflexion   5 2+  Ankle inversion      Ankle eversion      (Blank rows = not tested)  TRANSFERS: Assistive device utilized: None  Sit to stand: Complete Independence Stand to sit: Complete Independence     FUNCTIONAL TESTs:  PALPATION:   Pt tender to palpation along PSIS bilaterally.  Pt tender at piriformis area. FUNCTIONAL MOVEMENTS in supine:  pelvic tilt: posterior-no increase in pain, anterior-"pulling in back"; single knee to chest-no increase in pain     TODAY'S TREATMENT:  Initiated HEP-see below     PATIENT EDUCATION: Education details: PT eval results, POC, initiated HEP; initially discussed use of ice massage, need to "calm down" the PSIS/buttock area and allow for core stability, painfree mobility/positioning in sleeping Person educated: Patient Education method: Explanation, Demonstration, and Handouts Education comprehension: verbalized understanding and returned demonstration     HOME EXERCISE PROGRAM: Access Code: [email or web address] URL:  [email or web address] Date: 03/16/2022 Prepared by: [facility]   Exercises - Hooklying Single Knee to Chest Stretch  - 1 x daily - 7 x weekly - 1 sets - 3-5 reps - Supine Posterior Pelvic Tilt  - 1 x daily - 7 x weekly - 1-2 sets - 5-10 reps   _______________________________________________________________________    GOALS: Goals reviewed with patient? Yes   SHORT TERM GOALS: Target date: 04/14/2022   Pt will be independent with HEP for improved core stability, decreased pain during sleeping hours. Baseline: Goal  status: GOAL ACHIEVED   2.  Pt will report decrease in pain by at least 50% during night time hours. Baseline: Reports 7-8/10; reports maybe 20% improvement, 6-7/10; 04/29/22 reports some improvement in sleeping tolerance Goal status: MET 05/15/22     LONG TERM GOALS: Target date: 05/27/2022   Pt will be independent with HEP for improved core stability, lower extremity strength, and sleep positioning and decreased pain. Baseline:  Goal status: MET 05/15/22   2.  Pt will report decrease in pain by at least 75% during night time hours. Baseline: 04/29/22 reports some improvement in pain levels with sleeping; 05/15/22 report 50% improvement in pain levels at night  Goal status: PARTIALLY MET 05/15/22   3.  Pt will verbalize understanding of ways to manage/control low back pain and positioning/body mechanics. Baseline:  Goal status: MET  4.  Pt will report tolerance for reciprocal stair navigation with 50% improvement in stability. Baseline: performing step-to pattern Goal status: PARTIALLY MET; 05/15/22 demonstrating 25% improvement   5.  Pt will report 50% improvement ability to perform "Embrace tiger and return to the mountain" pose in tai-chi d/t improved hip ROM. Baseline: performing step-to pattern Goal status: NOT MET 05/15/22; reports still having a challenge with this exercise      ASSESSMENT:   CLINICAL  IMPRESSION: Patient arrived to session with report of fluctuating tolerance for sleep d/t LBP. Worked on core stability exercises with intermittent cueing for proper positioning and form. Patient able to complete all activities without c/o pain. Still with L LE weakness evident throughout, but appears improving. Able to perform stairs with alternating reciprocal pattern with light handrail support with good stability however still with decreased L quad eccentric control. Patient reports 50% improvement in sleeping tolerance d/t back pain but notes unchanged ability to perform one of her challenging tai-chi poses. Consolidated HEP for max benefit. At this time plan for D/C d/t patient going out of town for extended period of time.   OBJECTIVE IMPAIRMENTS decreased ROM, increased muscle spasms, impaired flexibility, and pain.    ACTIVITY LIMITATIONS sleeping and bed mobility   PARTICIPATION LIMITATIONS: community activity   PERSONAL FACTORS hx of piriformis pain, myofascial pain, migraine, asthma, lipoma at base of spine are also affecting patient's functional outcome.    REHAB POTENTIAL: Good   CLINICAL DECISION MAKING: Evolving/moderate complexity   EVALUATION COMPLEXITY: Moderate   PLAN: PT FREQUENCY: 1x/week   PT DURATION: 4 weeks    PLANNED INTERVENTIONS: Therapeutic exercises, Therapeutic activity, Neuromuscular re-education, Balance training, Gait training, Patient/Family education, Joint mobilization, Aquatic Therapy, Electrical stimulation, Cryotherapy, Moist heat, Ultrasound, and Manual therapy, Dry Needling, Canalith Repositioning, Vestibular Rehab   PLAN FOR NEXT SESSION:  DC at this time    PHYSICAL THERAPY DISCHARGE SUMMARY  Visits from Start of Care: 12  Current functional level related to goals / functional outcomes: See above clinical impression   Remaining deficits: Limited L eccentric control on stairs, difficulty with tai chi   Education / Equipment: HEP   Plan: Patient agrees to discharge.  Patient goals were partially met. Patient is being discharged due to going out of town.      Janene Harvey, PT, DPT 05/15/22 11:27 AM  [facility] at [facility] [address] [place], [place], [place] [address] Phone # [phone] Fax # [phone]

## 2022-05-14 ENCOUNTER — Ambulatory Visit (HOSPITAL_COMMUNITY): Payer: PPO | Attending: Interventional Cardiology

## 2022-05-14 DIAGNOSIS — R079 Chest pain, unspecified: Secondary | ICD-10-CM

## 2022-05-14 DIAGNOSIS — R059 Cough, unspecified: Secondary | ICD-10-CM | POA: Diagnosis not present

## 2022-05-14 LAB — ECHOCARDIOGRAM COMPLETE
Area-P 1/2: 3.48 cm2
S' Lateral: 2 cm

## 2022-05-15 ENCOUNTER — Ambulatory Visit: Payer: PPO | Attending: Sports Medicine | Admitting: Physical Therapy

## 2022-05-15 ENCOUNTER — Encounter: Payer: Self-pay | Admitting: Physical Therapy

## 2022-05-15 DIAGNOSIS — M5459 Other low back pain: Secondary | ICD-10-CM | POA: Insufficient documentation

## 2022-05-15 DIAGNOSIS — M6281 Muscle weakness (generalized): Secondary | ICD-10-CM | POA: Insufficient documentation

## 2022-05-19 ENCOUNTER — Ambulatory Visit: Payer: PPO | Admitting: Physical Therapy

## 2022-05-26 ENCOUNTER — Ambulatory Visit: Payer: PPO | Admitting: Physical Therapy

## 2022-05-28 DIAGNOSIS — R002 Palpitations: Secondary | ICD-10-CM | POA: Diagnosis not present

## 2022-06-16 ENCOUNTER — Ambulatory Visit (HOSPITAL_COMMUNITY): Payer: PPO

## 2022-06-17 ENCOUNTER — Telehealth (HOSPITAL_COMMUNITY): Payer: Self-pay | Admitting: *Deleted

## 2022-06-17 NOTE — Telephone Encounter (Signed)
Reaching out to patient to offer assistance regarding upcoming cardiac imaging study; pt verbalizes understanding of appt date/time, parking situation and where to check in, pre-test NPO status and medications ordered, and verified current allergies; name and call back number provided for further questions should they arise  Gordy Clement RN Navigator Cardiac Imaging [facility] [phone] office [phone] cell  Patient to take '25mg'$  metoprolol tartrate two hours prior to her cardiac CT scan. She is aware to arrive at 10:30am.

## 2022-06-17 NOTE — Telephone Encounter (Signed)
Attempted to call patient regarding upcoming cardiac CT appointment. °Left message on voicemail with name and callback number ° °Derel Mcglasson RN Navigator Cardiac Imaging °[facility] °[phone] Office °[phone] Cell ° °

## 2022-06-18 ENCOUNTER — Ambulatory Visit (HOSPITAL_COMMUNITY)
Admission: RE | Admit: 2022-06-18 | Discharge: 2022-06-18 | Disposition: A | Discharge status: Home / Self Care | Payer: PPO | Source: Ambulatory Visit | Attending: Cardiology | Admitting: Cardiology

## 2022-06-18 DIAGNOSIS — I2510 Atherosclerotic heart disease of native coronary artery without angina pectoris: Secondary | ICD-10-CM | POA: Insufficient documentation

## 2022-06-18 DIAGNOSIS — R079 Chest pain, unspecified: Secondary | ICD-10-CM | POA: Diagnosis not present

## 2022-06-18 MED ORDER — NITROGLYCERIN 0.4 MG SL SUBL: 0.8000 mg | SUBLINGUAL_TABLET | Freq: Once | SUBLINGUAL | Status: AC

## 2022-06-18 MED ORDER — IOHEXOL 350 MG/ML SOLN
100.0000 mL | Freq: Once | INTRAVENOUS | Status: AC | PRN
  Administered 2022-06-18: 100 mL via INTRAVENOUS

## 2022-06-18 MED ORDER — NITROGLYCERIN 0.4 MG SL SUBL
SUBLINGUAL_TABLET | SUBLINGUAL | Status: AC
  Administered 2022-06-18: 0.8 mg via SUBLINGUAL
  Filled 2022-06-18: qty 2

## 2022-06-23 DIAGNOSIS — R002 Palpitations: Secondary | ICD-10-CM | POA: Diagnosis not present

## 2022-06-23 DIAGNOSIS — N319 Neuromuscular dysfunction of bladder, unspecified: Secondary | ICD-10-CM | POA: Diagnosis not present

## 2022-06-23 DIAGNOSIS — R053 Chronic cough: Secondary | ICD-10-CM | POA: Diagnosis not present

## 2022-06-23 DIAGNOSIS — Z0000 Encounter for general adult medical examination without abnormal findings: Secondary | ICD-10-CM | POA: Diagnosis not present

## 2022-06-23 DIAGNOSIS — M7918 Myalgia, other site: Secondary | ICD-10-CM | POA: Diagnosis not present

## 2022-06-23 DIAGNOSIS — M810 Age-related osteoporosis without current pathological fracture: Secondary | ICD-10-CM | POA: Diagnosis not present

## 2022-06-23 DIAGNOSIS — Z1331 Encounter for screening for depression: Secondary | ICD-10-CM | POA: Diagnosis not present

## 2022-06-23 DIAGNOSIS — M5442 Lumbago with sciatica, left side: Secondary | ICD-10-CM | POA: Diagnosis not present

## 2022-06-23 DIAGNOSIS — Z79899 Other long term (current) drug therapy: Secondary | ICD-10-CM | POA: Diagnosis not present

## 2022-06-23 DIAGNOSIS — K30 Functional dyspepsia: Secondary | ICD-10-CM | POA: Diagnosis not present

## 2022-06-23 DIAGNOSIS — D6851 Activated protein C resistance: Secondary | ICD-10-CM | POA: Diagnosis not present

## 2022-06-28 NOTE — Progress Notes (Unsigned)
Cardiology Office Note:    Date:  06/30/2022   ID:  Nataleigh, Griffin 21-Mar-1941, MRN [id number]  PCP:  Cari Caraway, MD  Cardiologist:  None  Electrophysiologist:  None   Referring MD: Cari Caraway, MD   Chief Complaint  Patient presents with   Coronary Artery Disease    History of Present Illness:    KIDADA GING is a 81 y.o. female with a hx of factor V Leiden who presents for follow-up.  She was seen in the ED on 05/01/2022 with palpitations.  Labs notable for troponin 22 > 19.  She reports a single episode of palpitations that occurred the night she went to the ED.  Reports felt like heart was racing, lasted 30 minutes.  Reports since that time she has been having chest discomfort that she describes as dull aching pain on left side of chest.  She denies any dyspnea, lightheadedness, syncope, lower extremity edema.  She does tai chi twice per week for 1 hour.  She smoked almost 3 packs/day x 5 years, quit in 1974.  Family history includes mother had TIA in the 109s, maternal grandmother died of MI in 86s.  Echocardiogram 05/14/2022 showed normal biventricular function, no significant valvular disease.  Coronary CTA on 06/18/2022 showed minimal CAD, calcium score 6 (22nd percentile).  Zio patch x14 days on 06/01/2022 showed 18 episodes of SVT, longest lasting 18 beats.  Since last clinic visit, she reports she is doing well.  Denies any chest pain or dyspnea.  No further palpitations.  Does report has been having issues with chronic cough.  Also reports leg numbness/discoloration. Denies any chest pain, dyspnea.  Chronic cough. No palpitations.   Past Medical History:  Diagnosis Date   Headache    Nervous system disorder    spinal cord tethered   Osteoporosis     Past Surgical History:  Procedure Laterality Date   PARTIAL HYSTERECTOMY  1996   TUBAL LIGATION      Current Medications: Current Meds  Medication Sig   ALPRAZOLAM PO Take by mouth as needed.   Ascorbic  Acid (VITAMIN C) 1000 MG tablet Take 1,000 mg by mouth daily.    aspirin 81 MG EC tablet Take by mouth 2 (two) times a week.   atorvastatin (LIPITOR) 10 MG tablet Take 1 tablet (10 mg total) by mouth daily.   b complex vitamins capsule Take 1 capsule by mouth daily.   calcium citrate-vitamin D (CITRACAL+D) 315-200 MG-UNIT tablet Take 1 tablet by mouth every morning.   Cholecalciferol (VITAMIN D3) 2000 units TABS Take 2,000 Units by mouth daily.   CHROMIUM PO Take 200 mg by mouth daily.   Fexofenadine HCl (MUCINEX ALLERGY PO) Take 1 tablet by mouth daily.   gabapentin (NEURONTIN) 300 MG capsule Take 300 mg by mouth daily in the afternoon.   Loratadine 10 MG CAPS Take 1 capsule by mouth daily at 6 (six) AM. Joline Salt brand   Lutein 6 MG CAPS Take 1 capsule by mouth with breakfast, with lunch, and with evening meal.   Lysine 1000 MG TABS Take 1 tablet by mouth daily.   Magnesium 500 MG CAPS Take 1 capsule by mouth daily.   mirabegron ER (MYRBETRIQ) 50 MG TB24 tablet Take 1 tablet by mouth daily.   montelukast (SINGULAIR) 10 MG tablet Take 1 tablet (10 mg total) by mouth at bedtime.   Multiple Vitamins-Minerals (WOMENS BONE HEALTH PO) Take 1 tablet by mouth daily.   Omega-3 Fatty Acids (OMEGA 3  500 PO) Take 1 tablet by mouth daily at 6 (six) AM.   omeprazole (PRILOSEC) 40 MG capsule Take 40 mg by mouth 2 (two) times daily.   Zinc Sulfate (ZINC-220 PO) Take 1 tablet by mouth daily at 6 (six) AM.     Allergies:   Other, Ketoprofen, Tramadol, Oxycodone, and Venlafaxine   Social History   Socioeconomic History   Marital status: Married    Spouse name: John   Number of children: 0   Years of education: Some colle   Highest education level: Not on file  Occupational History   Occupation: Self employed  Tobacco Use   Smoking status: Former    Packs/day: 3.00    Years: 4.00    Total pack years: 12.00    Types: Cigarettes    Quit date: 12/10/1972    Years since quitting: 49.5   Smokeless  tobacco: Never  Substance and Sexual Activity   Alcohol use: Yes    Alcohol/week: 0.0 standard drinks of alcohol    Comment: 2-3 glasses wine/week   Drug use: No   Sexual activity: Not on file  Other Topics Concern   Not on file  Social History Narrative   Lives w/ husband    Caffeine use: 2-3 cups coffee per day   Social Determinants of Health   Financial Resource Strain: Not on file  Food Insecurity: Not on file  Transportation Needs: Not on file  Physical Activity: Not on file  Stress: Not on file  Social Connections: Not on file     Family History: The patient's family history includes Asthma in her mother; COPD in her mother; Cancer in her paternal grandmother; Heart attack in her maternal grandfather and maternal grandmother; Stroke in her mother. There is no history of Migraines.  ROS:   Please see the history of present illness.     All other systems reviewed and are negative.  EKGs/Labs/Other Studies Reviewed:    The following studies were reviewed today:   EKG:   05/04/22: Normal sinus rhythm, rate 69, no ST abnormalities  Recent Labs: 05/01/2022: BUN 15; Creatinine, Ser 0.67; Hemoglobin 13.5; Magnesium 1.9; Platelets 211; Potassium 3.9; Sodium 141  Recent Lipid Panel No results found for: "CHOL", "TRIG", "HDL", "CHOLHDL", "VLDL", "LDLCALC", "LDLDIRECT"  Physical Exam:    VS:  BP 118/62   Pulse 72   Ht 5' 3.5" (1.613 m)   Wt 125 lb 9.6 oz (57 kg)   LMP 08/01/2014   SpO2 99%   BMI 21.90 kg/m     Wt Readings from Last 3 Encounters:  06/30/22 125 lb 9.6 oz (57 kg)  05/04/22 121 lb (54.9 kg)  02/11/22 120 lb (54.4 kg)     GEN: in no acute distress HEENT: Normal NECK: No JVD; No carotid bruits CARDIAC: RRR, no murmurs, rubs, gallops RESPIRATORY:  Clear to auscultation without rales, wheezing or rhonchi  ABDOMEN: Soft, non-tender, non-distended MUSCULOSKELETAL:  No edema; No deformity  SKIN: Warm and dry NEUROLOGIC:  Alert and oriented x  3 PSYCHIATRIC:  Normal affect   ASSESSMENT:    1. CAD in native artery   2. Hyperlipidemia, unspecified hyperlipidemia type   3. Leg numbness   4. Discoloration of skin of lower leg   5. Palpitations     PLAN:    Chest pain: Atypical in description.  Echocardiogram 05/14/2022 showed normal biventricular function, no significant valvular disease.  Coronary CTA on 06/18/2022 showed minimal CAD, calcium score 6 (22nd percentile).   -Reports no  further chest pain, no further work-up recommended  Palpitations: Zio patch x14 days on 06/01/2022 showed 18 episodes of SVT, longest lasting 18 beats, with no symptoms reported. -Reports palpitations have resolved  Hyperlipidemia: LDL 90 on 04/15/2021.  Start atorvastatin 10 mg daily.  Goal LDL less than 70  Possible esophagitis: Esophageal wall thickening seen on coronary CT, could represent esophagitis.  Referred to GI for evaluation.  Factor V Leiden: discussed referral to hematology, she wishes to hold off at this point  Leg numbness/discoloration: Check ABIs  RTC in 1 year  Medication Adjustments/Labs and Tests Ordered: Current medicines are reviewed at length with the patient today.  Concerns regarding medicines are outlined above.  Orders Placed This Encounter  Procedures   Lipid panel   VAS [place] LOWER EXTREMITY ARTERIAL DUPLEX   VAS [place] ABI WITH/WO TBI   Meds ordered this encounter  Medications   atorvastatin (LIPITOR) 10 MG tablet    Sig: Take 1 tablet (10 mg total) by mouth daily.    Dispense:  90 tablet    Refill:  3    Patient Instructions  Medication Instructions:  START atorvastatin (Lipitor) 10 mg daily  *If you need a refill on your cardiac medications before your next appointment, please call your pharmacy*  Lab Work: Please return for FASTING labs in 2 months (Lipid)  Our in office lab hours are Monday-Friday 8:00-4:00, closed for lunch 12:45-1:45 pm.  No appointment needed.  [facility] locations:   [place] [address] (Dr. Newman Nickels office) - [address] [place] ([facility]) - [address] [place] [address] - [address] [place] - [address] Oncologist)  Testing/Procedures: Your physician has requested that you have an ankle brachial index (ABI). During this test an ultrasound and blood pressure cuff are used to evaluate the arteries that supply the arms and legs with blood. Allow thirty minutes for this exam. There are no restrictions or special instructions.  Follow-Up: At [facility], you and your health needs are our priority.  As part of our continuing mission to provide you with exceptional heart care, we have created designated Provider Care Teams.  These Care Teams include your primary Cardiologist (physician) and Advanced Practice Providers (APPs -  Physician Assistants and Nurse Practitioners) who all work together to provide you with the care you need, when you need it.  We recommend signing up for the patient portal called "MyChart".  Sign up information is provided on this After Visit Summary.  MyChart is used to connect with patients for Virtual Visits (Telemedicine).  Patients are able to view lab/test results, encounter notes, upcoming appointments, etc.  Non-urgent messages can be sent to your provider as well.   To learn more about what you can do with MyChart, go to [email or web address].    Your next appointment:   12 month(s)  The format for your next appointment:   In Person  Provider:   Dr. Gardiner Rhyme         Signed, Donato Heinz, MD  06/30/2022 10:49 AM    [place]

## 2022-06-30 ENCOUNTER — Encounter: Payer: Self-pay | Admitting: Cardiology

## 2022-06-30 ENCOUNTER — Ambulatory Visit: Payer: PPO | Attending: Cardiology | Admitting: Cardiology

## 2022-06-30 VITALS — BP 118/62 | HR 72 | Ht 63.5 in | Wt 125.6 lb

## 2022-06-30 DIAGNOSIS — R002 Palpitations: Secondary | ICD-10-CM

## 2022-06-30 DIAGNOSIS — L819 Disorder of pigmentation, unspecified: Secondary | ICD-10-CM

## 2022-06-30 DIAGNOSIS — I2510 Atherosclerotic heart disease of native coronary artery without angina pectoris: Secondary | ICD-10-CM | POA: Diagnosis not present

## 2022-06-30 DIAGNOSIS — E785 Hyperlipidemia, unspecified: Secondary | ICD-10-CM | POA: Diagnosis not present

## 2022-06-30 DIAGNOSIS — R200 Anesthesia of skin: Secondary | ICD-10-CM

## 2022-06-30 MED ORDER — ATORVASTATIN CALCIUM 10 MG PO TABS: 10.0000 mg | ORAL_TABLET | Freq: Every day | ORAL | 3 refills | Status: DC

## 2022-06-30 NOTE — Patient Instructions (Signed)
Medication Instructions:  START atorvastatin (Lipitor) 10 mg daily  *If you need a refill on your cardiac medications before your next appointment, please call your pharmacy*  Lab Work: Please return for FASTING labs in 2 months (Lipid)  Our in office lab hours are Monday-Friday 8:00-4:00, closed for lunch 12:45-1:45 pm.  No appointment needed.  [facility] locations:   [place] [address] (Dr. Newman Nickels office) - [address] [place] ([facility]) - [address] [place] [address] [place] [address] [place] - [address] Oncologist)  Testing/Procedures: Your physician has requested that you have an ankle brachial index (ABI). During this test an ultrasound and blood pressure cuff are used to evaluate the arteries that supply the arms and legs with blood. Allow thirty minutes for this exam. There are no restrictions or special instructions.  Follow-Up: At [facility], you and your health needs are our priority.  As part of our continuing mission to provide you with exceptional heart care, we have created designated Provider Care Teams.  These Care Teams include your primary Cardiologist (physician) and Advanced Practice Providers (APPs -  Physician Assistants and Nurse Practitioners) who all work together to provide you with the care you need, when you need it.  We recommend signing up for the patient portal called "MyChart".  Sign up information is provided on this After Visit Summary.  MyChart is used to connect with patients for Virtual Visits (Telemedicine).  Patients are able to view lab/test results, encounter notes, upcoming appointments, etc.  Non-urgent messages can be sent to your provider as well.   To learn more about  what you can do with MyChart, go to [email or web address].    Your next appointment:   12 month(s)  The format for your next appointment:   In Person  Provider:   Dr. Gardiner Rhyme

## 2022-07-14 ENCOUNTER — Ambulatory Visit (HOSPITAL_COMMUNITY)
Admission: RE | Admit: 2022-07-14 | Discharge: 2022-07-14 | Disposition: A | Discharge status: Home / Self Care | Payer: PPO | Source: Ambulatory Visit | Attending: Cardiology | Admitting: Cardiology

## 2022-07-14 DIAGNOSIS — R200 Anesthesia of skin: Secondary | ICD-10-CM | POA: Insufficient documentation

## 2022-07-14 DIAGNOSIS — L819 Disorder of pigmentation, unspecified: Secondary | ICD-10-CM | POA: Insufficient documentation

## 2022-08-04 DIAGNOSIS — R053 Chronic cough: Secondary | ICD-10-CM | POA: Diagnosis not present

## 2022-08-04 DIAGNOSIS — J3489 Other specified disorders of nose and nasal sinuses: Secondary | ICD-10-CM | POA: Diagnosis not present

## 2022-08-04 DIAGNOSIS — R058 Other specified cough: Secondary | ICD-10-CM | POA: Diagnosis not present

## 2022-08-07 DIAGNOSIS — K2100 Gastro-esophageal reflux disease with esophagitis, without bleeding: Secondary | ICD-10-CM | POA: Diagnosis not present

## 2022-08-07 DIAGNOSIS — K648 Other hemorrhoids: Secondary | ICD-10-CM | POA: Diagnosis not present

## 2022-08-07 DIAGNOSIS — K449 Diaphragmatic hernia without obstruction or gangrene: Secondary | ICD-10-CM | POA: Diagnosis not present

## 2022-08-07 DIAGNOSIS — D120 Benign neoplasm of cecum: Secondary | ICD-10-CM | POA: Diagnosis not present

## 2022-08-07 DIAGNOSIS — K6289 Other specified diseases of anus and rectum: Secondary | ICD-10-CM | POA: Diagnosis not present

## 2022-08-07 DIAGNOSIS — K5730 Diverticulosis of large intestine without perforation or abscess without bleeding: Secondary | ICD-10-CM | POA: Diagnosis not present

## 2022-08-07 DIAGNOSIS — Z86010 Personal history of colonic polyps: Secondary | ICD-10-CM | POA: Diagnosis not present

## 2022-08-07 DIAGNOSIS — Z09 Encounter for follow-up examination after completed treatment for conditions other than malignant neoplasm: Secondary | ICD-10-CM | POA: Diagnosis not present

## 2022-08-07 DIAGNOSIS — K2289 Other specified disease of esophagus: Secondary | ICD-10-CM | POA: Diagnosis not present

## 2022-08-07 DIAGNOSIS — K2930 Chronic superficial gastritis without bleeding: Secondary | ICD-10-CM | POA: Diagnosis not present

## 2022-08-07 LAB — HM COLONOSCOPY

## 2022-08-08 ENCOUNTER — Encounter: Payer: Self-pay | Admitting: Cardiology

## 2022-08-08 DIAGNOSIS — R252 Cramp and spasm: Secondary | ICD-10-CM

## 2022-08-11 DIAGNOSIS — D120 Benign neoplasm of cecum: Secondary | ICD-10-CM | POA: Diagnosis not present

## 2022-08-11 DIAGNOSIS — K2930 Chronic superficial gastritis without bleeding: Secondary | ICD-10-CM | POA: Diagnosis not present

## 2022-08-11 DIAGNOSIS — K2100 Gastro-esophageal reflux disease with esophagitis, without bleeding: Secondary | ICD-10-CM | POA: Diagnosis not present

## 2022-08-11 NOTE — Telephone Encounter (Signed)
ABIs were normal suggesting good blood flow to the legs.  If having cramping, recommend checking BMET/magnesium to make sure no electrolyte abnormalities

## 2022-08-25 IMAGING — CR DG HIP (WITH OR WITHOUT PELVIS) 2-3V*L*
2 series · 2 of 2 positions shown · non-contrast
Comparison: 01/18/2020 abdominal CT

CLINICAL DATA: Left posterior hip pain for many years.

EXAM:
DG HIP (WITH OR WITHOUT PELVIS) 2-3V LEFT

[w hip ap left]
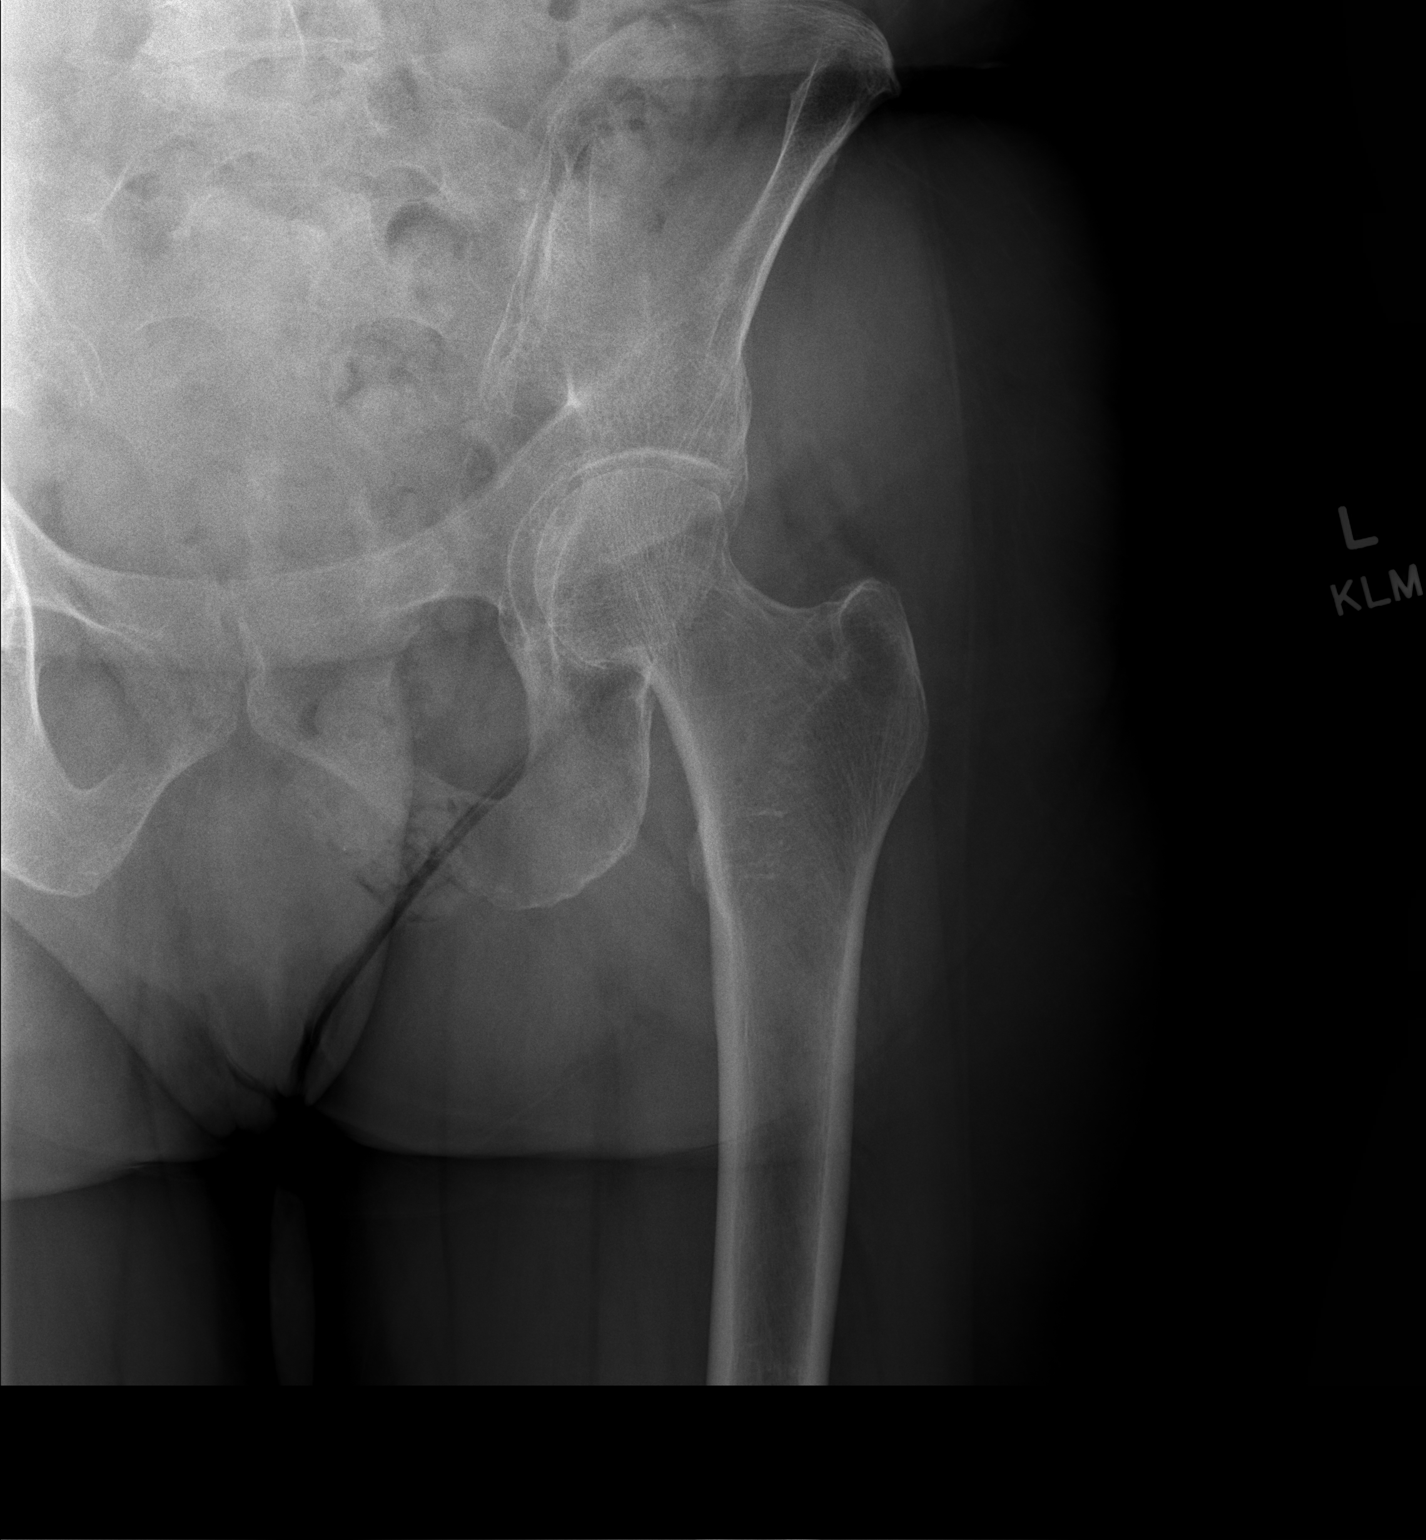

[w hip lat left]
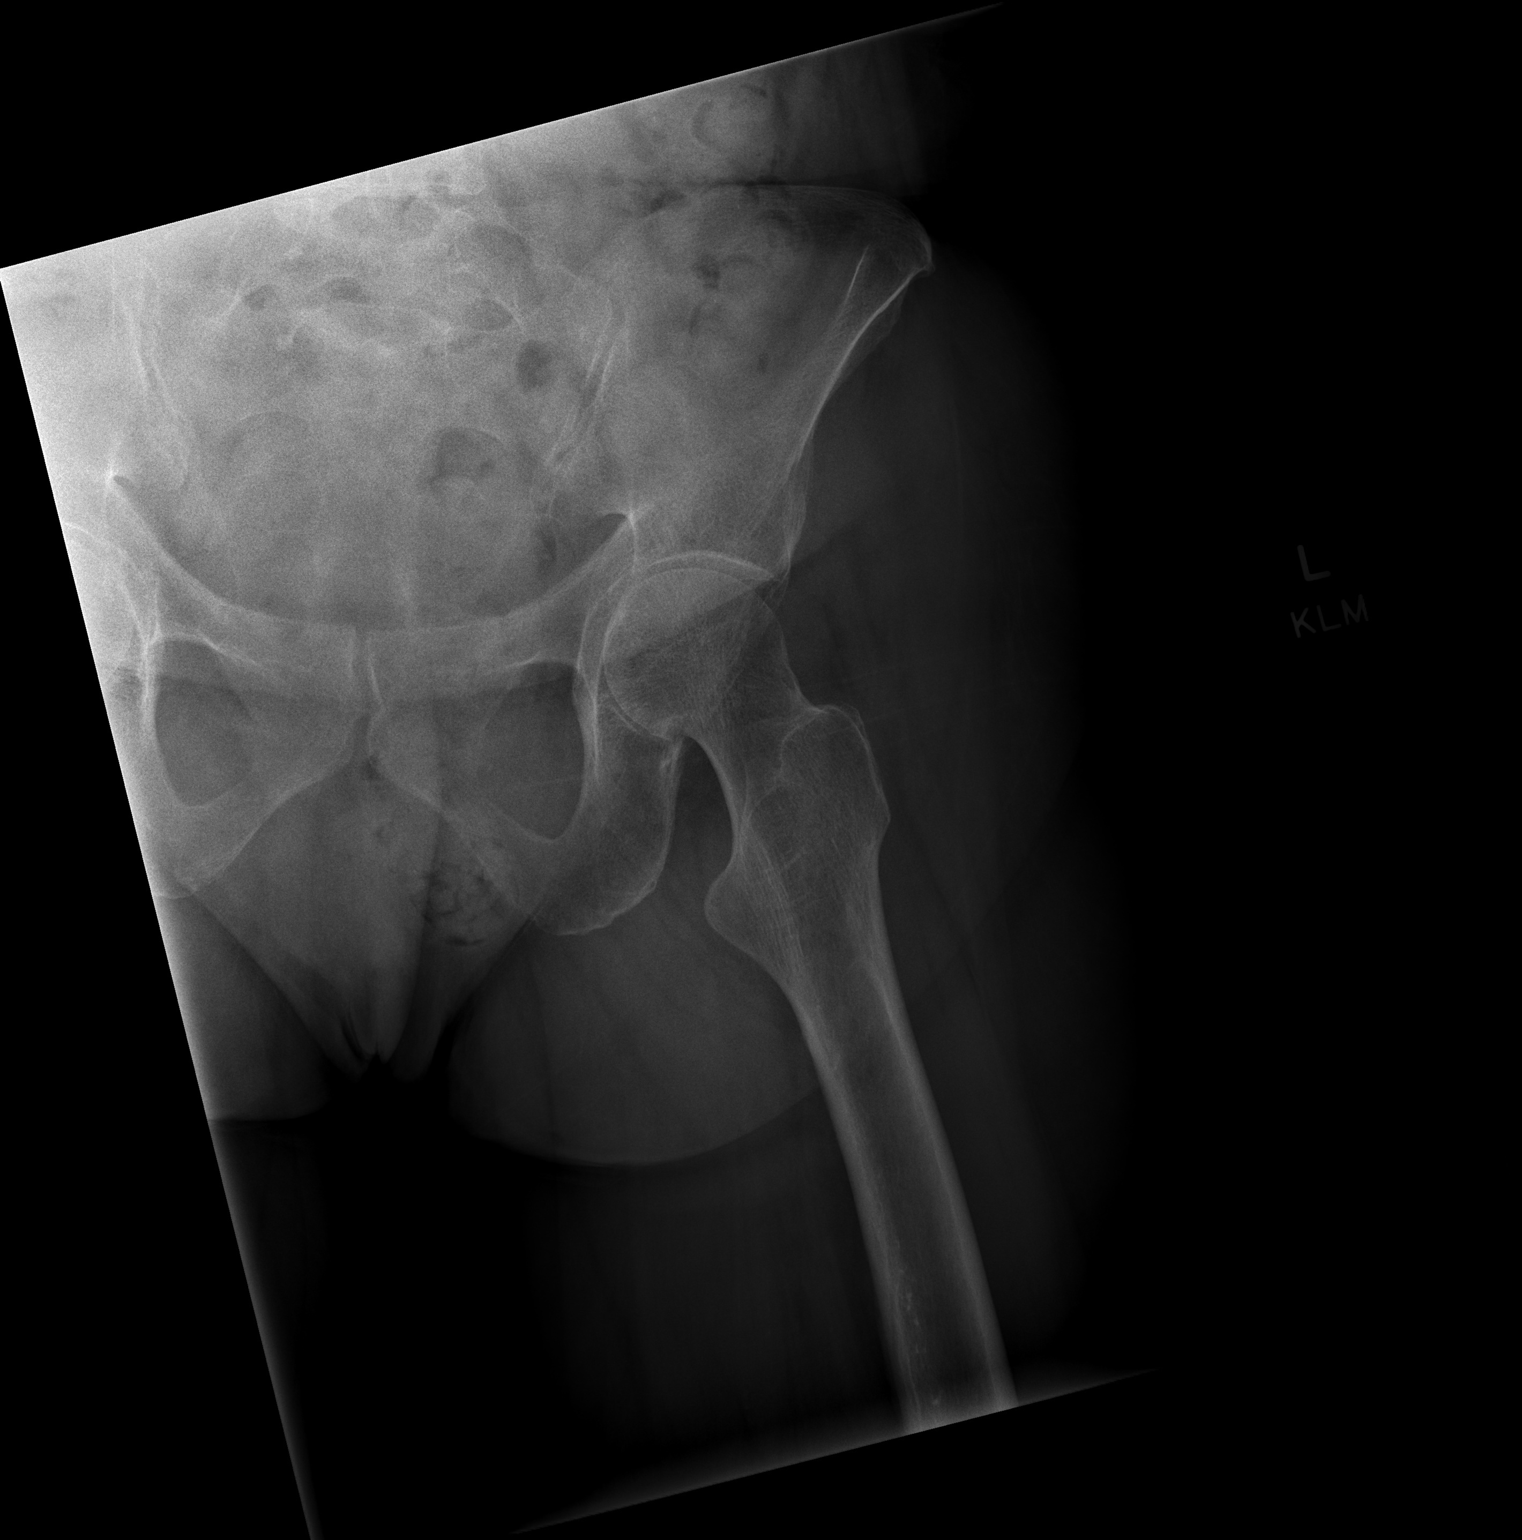

[2 of 2 positions shown; findings below may reference images not displayed]

FINDINGS: Stool appearance over the left groin which is a rectocele based on
preceding CT. No fracture, degenerative joint narrowing, or
spurring. Generalized osteopenia.
IMPRESSION: 1. Negative left hip.
2. Left eccentric rectocele.

## 2022-09-01 DIAGNOSIS — R053 Chronic cough: Secondary | ICD-10-CM | POA: Diagnosis not present

## 2022-09-01 DIAGNOSIS — K2289 Other specified disease of esophagus: Secondary | ICD-10-CM | POA: Diagnosis not present

## 2022-09-01 DIAGNOSIS — Z86010 Personal history of colonic polyps: Secondary | ICD-10-CM | POA: Diagnosis not present

## 2022-10-26 DIAGNOSIS — J309 Allergic rhinitis, unspecified: Secondary | ICD-10-CM | POA: Diagnosis not present

## 2022-10-26 DIAGNOSIS — K219 Gastro-esophageal reflux disease without esophagitis: Secondary | ICD-10-CM | POA: Diagnosis not present

## 2022-10-26 DIAGNOSIS — R052 Subacute cough: Secondary | ICD-10-CM | POA: Diagnosis not present

## 2022-10-27 ENCOUNTER — Other Ambulatory Visit: Payer: Self-pay | Admitting: Allergy and Immunology

## 2022-10-27 ENCOUNTER — Ambulatory Visit
Admission: RE | Admit: 2022-10-27 | Discharge: 2022-10-27 | Disposition: A | Discharge status: Home / Self Care | Payer: PPO | Source: Ambulatory Visit | Attending: Allergy and Immunology | Admitting: Allergy and Immunology

## 2022-10-27 DIAGNOSIS — R053 Chronic cough: Secondary | ICD-10-CM | POA: Diagnosis not present

## 2022-10-27 DIAGNOSIS — R052 Subacute cough: Secondary | ICD-10-CM

## 2022-11-03 DIAGNOSIS — G8929 Other chronic pain: Secondary | ICD-10-CM | POA: Diagnosis not present

## 2022-11-03 DIAGNOSIS — Q068 Other specified congenital malformations of spinal cord: Secondary | ICD-10-CM | POA: Diagnosis not present

## 2022-11-17 ENCOUNTER — Encounter: Payer: Self-pay | Admitting: Cardiology

## 2022-11-17 DIAGNOSIS — E785 Hyperlipidemia, unspecified: Secondary | ICD-10-CM

## 2022-11-19 ENCOUNTER — Other Ambulatory Visit: Payer: Self-pay | Admitting: *Deleted

## 2022-11-19 DIAGNOSIS — E785 Hyperlipidemia, unspecified: Secondary | ICD-10-CM

## 2022-11-19 NOTE — Telephone Encounter (Signed)
Recommend checking fasting lipid panel

## 2022-11-26 ENCOUNTER — Other Ambulatory Visit: Payer: Self-pay

## 2022-11-26 DIAGNOSIS — I2510 Atherosclerotic heart disease of native coronary artery without angina pectoris: Secondary | ICD-10-CM | POA: Diagnosis not present

## 2022-11-26 DIAGNOSIS — Z79899 Other long term (current) drug therapy: Secondary | ICD-10-CM

## 2022-11-26 DIAGNOSIS — E785 Hyperlipidemia, unspecified: Secondary | ICD-10-CM

## 2022-11-26 DIAGNOSIS — R252 Cramp and spasm: Secondary | ICD-10-CM

## 2022-11-26 LAB — BASIC METABOLIC PANEL
BUN/Creatinine Ratio: 25 (ref 12–28)
BUN: 17 mg/dL (ref 8–27)
CO2: 26 mmol/L (ref 20–29)
Calcium: 8.9 mg/dL (ref 8.7–10.3)
Chloride: 104 mmol/L (ref 96–106)
Creatinine, Ser: 0.67 mg/dL (ref 0.57–1.00)
Glucose: 92 mg/dL (ref 70–99)
Potassium: 3.9 mmol/L (ref 3.5–5.2)
Sodium: 143 mmol/L (ref 134–144)
eGFR: 89 mL/min/{1.73_m2} (ref >59)

## 2022-11-26 LAB — LIPID PANEL
Chol/HDL Ratio: 2.1 ratio (ref 0.0–4.4)
Cholesterol, Total: 144 mg/dL (ref 100–199)
HDL: 70 mg/dL (ref >39)
LDL Chol Calc (NIH): 62 mg/dL (ref 0–99)
Triglycerides: 56 mg/dL (ref 0–149)
VLDL Cholesterol Cal: 12 mg/dL (ref 5–40)

## 2022-11-26 LAB — MAGNESIUM: Magnesium: 1.9 mg/dL (ref 1.6–2.3)

## 2022-12-10 ENCOUNTER — Telehealth: Payer: Self-pay | Admitting: Cardiology

## 2022-12-10 NOTE — Telephone Encounter (Signed)
*  STAT* If patient is at the pharmacy, call can be transferred to refill team.   1. Which medications need to be refilled? (please list name of each medication and dose if known)  atorvastatin (LIPITOR) 10 MG tablet  2. Which pharmacy/location (including street and city if local pharmacy) is medication to be sent to?[place] [id number] [address]    3. Do they need a 30 day or 90 day supply? [place]

## 2022-12-11 MED ORDER — ATORVASTATIN CALCIUM 10 MG PO TABS
10.0000 mg | ORAL_TABLET | Freq: Every day | ORAL | 2 refills | Status: DC
Start: 2022-12-11 — End: 2023-09-06

## 2022-12-22 ENCOUNTER — Telehealth: Payer: Self-pay | Admitting: Internal Medicine

## 2022-12-22 NOTE — Telephone Encounter (Signed)
Hi Dr. Hilarie Fredrickson,   We received a referral for patient to be evaluated for GERD symptoms. Has GI history with [facility] last seen in November of 2023. The patient is specifically asking if you would continue her care. Stated her GI provider has retired. Obtained all records including EGD and Colonoscopy reports for you to review and advise on scheduling.   Thank you.

## 2022-12-28 ENCOUNTER — Encounter: Payer: Self-pay | Admitting: *Deleted

## 2022-12-29 ENCOUNTER — Encounter: Payer: Self-pay | Admitting: Internal Medicine

## 2022-12-29 ENCOUNTER — Ambulatory Visit: Payer: PPO | Admitting: Internal Medicine

## 2022-12-29 VITALS — BP 110/68 | HR 83 | Ht 63.25 in | Wt 125.0 lb

## 2022-12-29 DIAGNOSIS — R053 Chronic cough: Secondary | ICD-10-CM | POA: Diagnosis not present

## 2022-12-29 DIAGNOSIS — Z86010 Personal history of colonic polyps: Secondary | ICD-10-CM | POA: Diagnosis not present

## 2022-12-29 DIAGNOSIS — K5909 Other constipation: Secondary | ICD-10-CM

## 2022-12-29 DIAGNOSIS — M6289 Other specified disorders of muscle: Secondary | ICD-10-CM

## 2022-12-29 DIAGNOSIS — K219 Gastro-esophageal reflux disease without esophagitis: Secondary | ICD-10-CM | POA: Diagnosis not present

## 2022-12-29 NOTE — Progress Notes (Signed)
Patient ID: HAMSIKA MILAN, female   DOB: July 27, 1941, 82 y.o.   MRN: [id number] HPI: Sharayne Cousin is a 81 year old female with a past medical history of GERD, adenomatous colon polyps, chronic constipation, colonic diverticulosis, neurogenic bladder, tethered spinal cord, chronic productive cough who is here to establish care for her particularly chronic cough but also pelvic floor dysfunction.  She is here alone today.  Her GI care had been with Dr. Cristina Gong for years and then most recently with Dr. Therisa Doyne.    She reports that she has been dealing with a chronic very productive cough for 25 years but worsened in frequency and mucus production for the last 1 to 2 years.  She states that mucus will gather in clumps and require her to cough this up.  Does not wake her from sleep.  She has been seen by ENT which she states tells her this is a GI problem.  She has been seen by [facility] and pneumonia and other pulmonary causes for cough reportedly excluded.  She is also seen Dr. Harold Hedge with allergy and asthma.  She had a recent chest x-ray which was normal.  She feels that there is a component of sinus drainage.  She has been on PPI for a number of years but this was increased to twice daily and it did not seem to help her cough.  She has backed this off to now using it once a day maybe 3 or 4 days a week with the other days not using it at all.  It sounds that if she is taking famotidine 20 mg in the evenings.  The famotidine was started recently by ENT to the best of my knowledge at this time.  She uses magnesium which works well for her chronic constipation.  Most of her bowel movements are regular.  She does have what she feels is weakening anal sphincter tone and can make it difficult for her to feel like a bowel movement is complete.  She has been trying Kegel exercises for this.  She is now on loratadine but off Singulair.  EGD performed 08/07/2022 -- Normal esophagus; Z-line  irregular at 35 cm which was biopsied.  2 cm hiatus hernia.  Normal stomach and duodenum.  Gastric biopsies.  Pathology = GE junction biopsy columnar type mucosa with chronic inflammation.  No squamous mucosa seen.  Negative for intestinal metaplasia.  Gastric biopsy chronic inactive gastritis without H. pylori. Colonoscopy 08/07/2022; decree sphincter tone.  Normal terminal ileum.  Two 7 to 9 mm cecal polyps removed by hot snare.  Multiple diverticula from the transverse colon and the sigmoid colon.  Nonbleeding internal hemorrhoids.  Pathology = tubular adenoma.    Past Medical History:  Diagnosis Date   Diverticulosis    GERD (gastroesophageal reflux disease)    Headache    Heterozygous factor V Leiden mutation ([place])    Hiatal hernia    Insomnia    Nervous system disorder    spinal cord tethered   Neurogenic bladder    Osteoporosis    Osteoporosis    Tubular adenoma of colon    Urinary incontinence     Past Surgical History:  Procedure Laterality Date   dental implants     PARTIAL HYSTERECTOMY  10/12/1994   SALPINGOOPHORECTOMY Right    TUBAL LIGATION      Outpatient Medications Prior to Visit  Medication Sig Dispense Refill   ALPRAZOLAM PO Take by mouth as needed.     Ascorbic Acid (  VITAMIN C) 1000 MG tablet Take 1,000 mg by mouth daily.      aspirin 81 MG EC tablet Take by mouth 2 (two) times a week.     atorvastatin (LIPITOR) 10 MG tablet Take 1 tablet (10 mg total) by mouth daily. 90 tablet 2   b complex vitamins capsule Take 1 capsule by mouth daily.     calcium citrate-vitamin D (CITRACAL+D) 315-200 MG-UNIT tablet Take 1 tablet by mouth every morning.     Cholecalciferol (VITAMIN D3) 2000 units TABS Take 2,000 Units by mouth daily.     CHROMIUM PO Take 200 mg by mouth daily.     Fexofenadine HCl (MUCINEX ALLERGY PO) Take 1 tablet by mouth daily.     gabapentin (NEURONTIN) 300 MG capsule Take 300 mg by mouth daily in the afternoon.     Loratadine 10 MG CAPS Take 1  capsule by mouth daily at 6 (six) AM. Joline Salt brand     Lutein 6 MG CAPS Take 1 capsule by mouth with breakfast, with lunch, and with evening meal.     Lysine 1000 MG TABS Take 1 tablet by mouth daily.     Magnesium 500 MG CAPS Take 1 capsule by mouth daily.     mirabegron ER (MYRBETRIQ) 50 MG TB24 tablet Take 1 tablet by mouth daily.     Multiple Vitamins-Minerals (WOMENS BONE HEALTH PO) Take 1 tablet by mouth daily.     Omega-3 Fatty Acids (OMEGA 3 500 PO) Take 1 tablet by mouth daily at 6 (six) AM.     omeprazole (PRILOSEC) 40 MG capsule Take 40 mg by mouth 2 (two) times daily.     Zinc Sulfate (ZINC-220 PO) Take 1 tablet by mouth daily at 6 (six) AM.     montelukast (SINGULAIR) 10 MG tablet Take 1 tablet (10 mg total) by mouth at bedtime. (Patient not taking: Reported on 12/29/2022) 90 tablet 3   No facility-administered medications prior to visit.    Allergies  Allergen Reactions   Other     Other reaction(s): Other (See Comments) Severe GI irritation. Severe GI irritation.    Ketoprofen     Stomach issues   Tramadol Itching   Oxycodone Itching   Venlafaxine Itching    Family History  Problem Relation Age of Onset   Asthma Mother    COPD Mother    Stroke Mother        TIA   Osteoporosis Mother    Hypertension Mother    Macular degeneration Mother    Glaucoma Mother    Heart attack Father    CAD Father    CAD Brother    Skin cancer Brother    Heart attack Maternal Grandmother    Heart attack Maternal Grandfather    Cancer Paternal Grandmother    Migraines Neg Hx    Colon cancer Neg Hx    Colon polyps Neg Hx    Liver disease Neg Hx     Social History   Tobacco Use   Smoking status: Former    Packs/day: 3.00    Years: 4.00    Additional pack years: 0.00    Total pack years: 12.00    Types: Cigarettes    Quit date: 12/10/1972    Years since quitting: 50.0   Smokeless tobacco: Never  Substance Use Topics   Alcohol use: Yes    Alcohol/week: 0.0 standard  drinks of alcohol    Comment: 2-3 glasses wine/week   Drug use: No  ROS: As per history of present illness, otherwise negative  Ht 5' 3.25" (1.607 m)   Wt 125 lb (56.7 kg)   LMP 08/01/2014   BMI 21.97 kg/m  Gen: awake, alert, NAD HEENT: anicteric  CV: RRR, no mrg Pulm: CTA b/l Abd: soft, NT/ND, +BS throughout Ext: no c/c/e Neuro: nonfocal   RELEVANT LABS AND IMAGING: CBC    Component Value Date/Time   WBC 6.9 05/01/2022 1208   RBC 4.45 05/01/2022 1208   HGB 13.5 05/01/2022 1208   HCT 40.9 05/01/2022 1208   PLT 211 05/01/2022 1208   MCV 91.9 05/01/2022 1208   MCH 30.3 05/01/2022 1208   MCHC 33.0 05/01/2022 1208   RDW 13.8 05/01/2022 1208   LYMPHSABS 0.6 (L) 03/17/2008 1345   MONOABS 0.3 03/17/2008 1345   EOSABS 0.0 03/17/2008 1345   BASOSABS 0.0 03/17/2008 1345    CMP     Component Value Date/Time   NA 143 11/26/2022 0841   K 3.9 11/26/2022 0841   CL 104 11/26/2022 0841   CO2 26 11/26/2022 0841   GLUCOSE 92 11/26/2022 0841   GLUCOSE 81 05/01/2022 1208   BUN 17 11/26/2022 0841   CREATININE 0.67 11/26/2022 0841   CALCIUM 8.9 11/26/2022 0841   PROT 6.7 01/18/2020 0202   ALBUMIN 3.8 01/18/2020 0202   AST 24 01/18/2020 0202   ALT 26 01/18/2020 0202   ALKPHOS 65 01/18/2020 0202   BILITOT 0.6 01/18/2020 0202   GFRNONAA >60 05/01/2022 1208   GFRAA >60 01/18/2020 0202   OVER-READ INTERPRETATION  CT CHEST   The following report is a limited chest CT over-read performed by radiologist Dr. Dahlia Bailiff of [facility] on 06/18/2022. This over-read does not include interpretation of cardiac or coronary anatomy or pathology. The coronary artery CTA and coronary calcium score. Interpretation by the cardiologist is attached.   COMPARISON:  None Available.   FINDINGS: Vascular: No significant noncardiac vascular finding.   Mediastinum/Nodes: Calcified left hilar lymph nodes. Within the visualized portions of the chest there are no  pathologically enlarged mediastinal or hilar lymph nodes. Small hiatal hernia with distal esophageal wall thickening.   Lungs/Pleura: Calcified granulomata. Within the visualized portions of the chest there are no suspicious pulmonary nodules or masses, no focal airspace consolidation, and no pleural effusion or pneumothorax.   Upper Abdomen: Calcified splenic granulomata.   Musculoskeletal: Multilevel degenerative changes spine.   IMPRESSION: Small hiatal hernia with distal esophageal wall thickening, suggest correlation for esophagitis and consider further evaluation with upper endoscopy if clinically indicated.   Electronically Signed: By: Dahlia Bailiff M.D. On: 06/18/2022 13:35    CLINICAL DATA:  Chronic cough   EXAM: CHEST - 2 VIEW   COMPARISON:  05/01/2022   FINDINGS: The heart size and mediastinal contours are within normal limits. Both lungs are clear. The visualized skeletal structures are unremarkable.   IMPRESSION: No active cardiopulmonary disease.     Electronically Signed   By: Randa Ngo M.D.   On: 10/28/2022 22:56   ASSESSMENT/PLAN: 82 year old female with a past medical history of GERD, adenomatous colon polyps, chronic constipation, colonic diverticulosis, neurogenic bladder, tethered spinal cord, chronic productive cough who is here to establish care for her particularly chronic cough but also pelvic floor dysfunction.   Chronic cough --probably multifactorial.  The question is for me is how much of her cough is GERD related.  I told her we could objectively determine this which would help drive therapy.  Her upper endoscopy was largely  unremarkable though there was some mild inflammation at the GE junction which was probably reflux related.  There was no Barrett's. -- Continue omeprazole 40 mg but use once daily -- Famotidine 20 mg in the evening -- 24-hour pH and impedance plus esophageal manometry; determine if there is an association  between coughing episodes and reflux  2.  Chronic constipation --she is using magnesium which is effective for her  3.  Pelvic floor dysfunction/decreased sphincter tone by [facility])/pelvic floor dysfunction  -- Referral to [facility] for pelvic floor physical therapy evaluation and treatment  4.  History of adenomatous colon polyps --she is a very healthy 82 year old at this point.  We can discuss surveillance colonoscopy based on age and overall risk-benefit profile in October 2028 -- 5-year recall based on personal history of multiple adenomatous colon polyps; discussion for possible colonoscopy October 2028     [id number], Abigail Butts, [place],  [place] [id number]

## 2022-12-29 NOTE — Patient Instructions (Addendum)
Please MyChart message our office to let us know if you are taking famotidine and what dose.   Continue omeprazole daily, even through your scheduled Esophageal Manometry.  We are going to refer your pelvic floor therapy with Dr. Earlie Counts. They will contact you with an appointment date and time.  You have been scheduled for an esophageal manometry at [facility] on 03/24/23 at 8:30 am. Please arrive 30 minutes prior to your procedure for registration. You will need to go to outpatient registration (1st floor of the hospital) first. Make certain to bring your insurance cards as well as a complete list of medications. REMAIN ON OMEPRAZOLE FOR THIS TEST.  Please remember the following:  1) Do not take any muscle relaxants, xanax (alprazolam) or ativan for 1 day prior to your test as well as the day of the test.  2) Nothing to eat or drink for 8 hours before your test.  3) Hold all diabetic medications/insulin the morning of the test. You may eat and take your medications after the test.  It will take at least 2 weeks to receive the results of this test from your physician. ------------------------------------------ ABOUT ESOPHAGEAL MANOMETRY Esophageal manometry (muh-NOM-uh-tree) is a test that gauges how well your esophagus works. Your esophagus is the long, muscular tube that connects your throat to your stomach. Esophageal manometry measures the rhythmic muscle contractions (peristalsis) that occur in your esophagus when you swallow. Esophageal manometry also measures the coordination and force exerted by the muscles of your esophagus.  During esophageal manometry, a thin, flexible tube (catheter) that contains sensors is passed through your nose, down your esophagus and into your stomach. Esophageal manometry can be helpful in diagnosing some mostly uncommon disorders that affect your esophagus.  Why it's done Esophageal manometry is used to evaluate the movement (motility) of  food through the esophagus and into the stomach. The test measures how well the circular bands of muscle (sphincters) at the top and bottom of your esophagus open and close, as well as the pressure, strength and pattern of the wave of esophageal muscle contractions that moves food along.  What you can expect Esophageal manometry is an outpatient procedure done without sedation. Most people tolerate it well. You may be asked to change into a hospital gown before the test starts.  During esophageal manometry  While you are sitting up, a member of your health care team sprays your throat with a numbing medication or puts numbing gel in your nose or both.  A catheter is guided through your nose into your esophagus. The catheter may be sheathed in a water-filled sleeve. It doesn't interfere with your breathing. However, your eyes may water, and you may gag. You may have a slight nosebleed from irritation.  After the catheter is in place, you may be asked to lie on your back on an exam table, or you may be asked to remain seated.  You then swallow small sips of water. As you do, a computer connected to the catheter records the pressure, strength and pattern of your esophageal muscle contractions.  During the test, you'll be asked to breathe slowly and smoothly, remain as still as possible, and swallow only when you're asked to do so.  A member of your health care team may move the catheter down into your stomach while the catheter continues its measurements.  The catheter then is slowly withdrawn. The test usually lasts 20 to 30 minutes.  After esophageal manometry  When your esophageal  manometry is complete, you may return to your normal activities  This test typically takes 30-45 minutes to complete. ________________________________________________________________________________  The [facility] providers would like to encourage you to use [facility] to communicate with providers for non-urgent requests  or questions.  Due to long hold times on the telephone, sending your provider a message by [facility] may be a faster and more efficient way to get a response.  Please allow 48 business hours for a response.  Please remember that this is for non-urgent requests.   Due to recent changes in healthcare laws, you may see the results of your imaging and laboratory studies on MyChart before your provider has had a chance to review them.  We understand that in some cases there may be results that are confusing or concerning to you. Not all laboratory results come back in the same time frame and the provider may be waiting for multiple results in order to interpret others.  Please give [place] 48 hours in order for your provider to thoroughly review all the results before contacting the office for clarification of your results.

## 2023-01-08 ENCOUNTER — Telehealth: Payer: Self-pay | Admitting: Cardiology

## 2023-01-08 NOTE — Telephone Encounter (Signed)
Spoke with Dr. Gardiner Rhyme. He reviewed patient's chart and other provider's notes and did not feel this was heart related.    Spoke with patient about this. She is very concerned about this and thinks this is for sure a heart event as this has never happened before. She is not convinced that this is NOT a cardiac issue.   Scheduled her for DOD appt on 4/1 as she leaves town on 4/2

## 2023-01-08 NOTE — Telephone Encounter (Signed)
Pt c/o Shortness Of Breath: STAT if SOB developed within the last 24 hours or pt is noticeably SOB on the phone  1. Are you currently SOB (can you hear that pt is SOB on the phone)? No   2. How long have you been experiencing SOB? Last nigh  3. Are you SOB when sitting or when up moving around? Woke up on right side when throat tightened up and gasping for air  4. Are you currently experiencing any other symptoms? Mild nausea, sweating

## 2023-01-08 NOTE — Telephone Encounter (Addendum)
Spoke with patient of Dr. Gardiner Rhyme who reports she "had an event" that "was so unusual that I felt I should report it and find out what I should do if it happened again".   Patient reports she was on her right side, going to sleep. She said all of the sudden she felt like she had an upset stomach, felt like she kind of wanted to throw up. She said her throat and upper chest tightened up, she couldn't quite catch her breath. She started sweating. The episode lasted about 10 minutes. She told her husband that she "thinks she just had a heart issue". She went on to bed with no issues and feels fine this morning.   She did not take any PRN meds for this event. This is the first time she has had an episode like this.   She reports last year she had a rapid heart beat and was found to have a tiny amount of plaque in her heart arteries.   Advised patient that she also put a call in to her GI doctor.   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 She said this will be a stressful situation and she would like to know what to do if this occurs again.   Message sent to MD to review.

## 2023-01-11 ENCOUNTER — Ambulatory Visit: Payer: PPO | Attending: Internal Medicine | Admitting: Internal Medicine

## 2023-01-11 ENCOUNTER — Encounter: Payer: Self-pay | Admitting: Internal Medicine

## 2023-01-11 VITALS — BP 122/74 | HR 68 | Ht 63.0 in | Wt 127.0 lb

## 2023-01-11 DIAGNOSIS — K224 Dyskinesia of esophagus: Secondary | ICD-10-CM

## 2023-01-11 DIAGNOSIS — R079 Chest pain, unspecified: Secondary | ICD-10-CM

## 2023-01-11 MED ORDER — NITROGLYCERIN 0.4 MG SL SUBL: 0.4000 mg | SUBLINGUAL_TABLET | SUBLINGUAL | 3 refills | Status: DC | PRN

## 2023-01-11 NOTE — Patient Instructions (Signed)
Medication Instructions:  Dr. Debara Pickett has prescribed NITROGLYCERIN to use as needed.   *If you need a refill on your cardiac medications before your next appointment, please call your pharmacy*    Follow-Up: At [facility], you and your health needs are our priority.  As part of our continuing mission to provide you with exceptional heart care, we have created designated Provider Care Teams.  These Care Teams include your primary Cardiologist (physician) and Advanced Practice Providers (APPs -  Physician Assistants and Nurse Practitioners) who all work together to provide you with the care you need, when you need it.  We recommend signing up for the patient portal called "MyChart".  Sign up information is provided on this After Visit Summary.  MyChart is used to connect with patients for Virtual Visits (Telemedicine).  Patients are able to view lab/test results, encounter notes, upcoming appointments, etc.  Non-urgent messages can be sent to your provider as well.   To learn more about what you can do with MyChart, go to [email or web address].    Your next appointment:     Next routine visit with Dr. Gardiner Rhyme is due September 2024  Other Instructions  Stress, spicy foods/diet may worsen symptoms

## 2023-01-11 NOTE — Progress Notes (Signed)
OFFICE CONSULT NOTE  Chief Complaint:  Chest tightness  Primary Care Physician: Cari Caraway, MD  HPI:  Regina [name] is a 82 y.o. female who is being seen today for the evaluation of chest tightness.  This is a 82 year old female patient of Dr. Gardiner Rhyme who is seen today for a doc of the day visit acutely for chest discomfort.  She had a protracted episode of severe chest discomfort which lasted about 10 minutes.  She initially said it felt somewhat like reflux although she says she does not clearly know what that is.  She is on some reflux medication.  She then felt an ascending discomfort in the center of her chest that went up to her upper throat and felt like a squeezing.  She did not describe it as a pain.  She also became diaphoretic and nauseated.  Eventually the episode subsided.  She was concerned about this being a cardiac event.  Fortunately we reviewed that she had had a coronary CT angiogram back in September 2023 which demonstrated minimal coronary calcium with a score of 6.1, 22nd percentile.  EKG today shows sinus rhythm with nonspecific T wave changes.  She had previously switched GI doctors and now sees Dr. Hilarie Fredrickson and in fact is scheduled for esophageal manometry in June 2024.  She was not clear as to why she was having that procedure although this would typically be to look for causes of chest discomfort, when it is suspected that patient may have esophageal spasm.  According to his note, he also wanted to do 24-hour pH monitoring as well to determine if reflux may be contributing to the patient's coughing.  PMHx:  Past Medical History:  Diagnosis Date   Diverticulosis    GERD (gastroesophageal reflux disease)    Headache    Heterozygous factor V Leiden mutation    Hiatal hernia    Insomnia    Nervous system disorder    spinal cord tethered   Neurogenic bladder    Osteoporosis    Osteoporosis    Tubular adenoma of colon    Urinary incontinence     Past  Surgical History:  Procedure Laterality Date   dental implants     PARTIAL HYSTERECTOMY  10/12/1994   SALPINGOOPHORECTOMY Right    TUBAL LIGATION      FAMHx:  Family History  Problem Relation Age of Onset   Asthma Mother    COPD Mother    Stroke Mother        TIA   Osteoporosis Mother    Hypertension Mother    Macular degeneration Mother    Glaucoma Mother    Heart attack Father    CAD Father    CAD Brother    Skin cancer Brother    Heart attack Maternal Grandmother    Heart attack Maternal Grandfather    Cancer Paternal Grandmother    Migraines Neg Hx    Colon cancer Neg Hx    Colon polyps Neg Hx    Liver disease Neg Hx     SOCHx:   reports that she quit smoking about 50 years ago. Her smoking use included cigarettes. She has a 12.00 pack-year smoking history. She has never used smokeless tobacco. She reports current alcohol use. She reports that she does not use drugs.  ALLERGIES:  Allergies  Allergen Reactions   Other     Other reaction(s): Other (See Comments) Severe GI irritation. Severe GI irritation.    Ketoprofen  Stomach issues   Tramadol Itching   Oxycodone Itching   Venlafaxine Itching    ROS: Pertinent items noted in HPI and remainder of comprehensive ROS otherwise negative.  HOME MEDS: Current Outpatient Medications on File Prior to Visit  Medication Sig Dispense Refill   ALPRAZOLAM PO Take by mouth as needed.     Ascorbic Acid (VITAMIN C) 1000 MG tablet Take 1,000 mg by mouth daily.      aspirin 81 MG EC tablet Take by mouth 2 (two) times a week.     atorvastatin (LIPITOR) 10 MG tablet Take 1 tablet (10 mg total) by mouth daily. 90 tablet 2   b complex vitamins capsule Take 1 capsule by mouth daily.     calcium citrate-vitamin D (CITRACAL+D) 315-200 MG-UNIT tablet Take 1 tablet by mouth every morning.     Cholecalciferol (VITAMIN D3) 2000 units TABS Take 2,000 Units by mouth daily.     CHROMIUM PO Take 200 mg by mouth daily.      famotidine (PEPCID) 20 MG tablet Take 20 mg by mouth at bedtime as needed for heartburn or indigestion.     Fexofenadine HCl (MUCINEX ALLERGY PO) Take 1 tablet by mouth daily.     gabapentin (NEURONTIN) 300 MG capsule Take 300 mg by mouth daily in the afternoon.     Loratadine 10 MG CAPS Take 1 capsule by mouth daily at 6 (six) AM. Joline Salt brand     Lutein 6 MG CAPS Take 1 capsule by mouth with breakfast, with lunch, and with evening meal.     Lysine 1000 MG TABS Take 1 tablet by mouth daily.     Magnesium 500 MG CAPS Take 1 capsule by mouth daily.     mirabegron ER (MYRBETRIQ) 50 MG TB24 tablet Take 1 tablet by mouth daily.     montelukast (SINGULAIR) 10 MG tablet Take 1 tablet (10 mg total) by mouth at bedtime. 90 tablet 3   Multiple Vitamins-Minerals (WOMENS BONE HEALTH PO) Take 1 tablet by mouth daily.     Omega-3 Fatty Acids (OMEGA 3 500 PO) Take 1 tablet by mouth daily at 6 (six) AM.     omeprazole (PRILOSEC) 40 MG capsule Take 40 mg by mouth 2 (two) times daily.     Zinc Sulfate (ZINC-220 PO) Take 1 tablet by mouth daily at 6 (six) AM.     No current facility-administered medications on file prior to visit.    LABS/IMAGING: No results found for this or any previous visit (from the past 48 hour(s)). No results found.  LIPID PANEL:    Component Value Date/Time   CHOL 144 11/26/2022 0841   TRIG 56 11/26/2022 0841   HDL 70 11/26/2022 0841   CHOLHDL 2.1 11/26/2022 0841   LDLCALC 62 11/26/2022 0841    WEIGHTS: Wt Readings from Last 3 Encounters:  01/11/23 127 lb (57.6 kg)  12/29/22 125 lb (56.7 kg)  06/30/22 125 lb 9.6 oz (57 kg)    VITALS: BP 122/74 (BP Location: Left Arm, Patient Position: Sitting, Cuff Size: Normal)   Pulse 68   Ht 5\' 3"  (1.6 m)   Wt 127 lb (57.6 kg)   LMP 08/01/2014   SpO2 98%   BMI 22.50 kg/m   EXAM: General appearance: alert and no distress Neck: no carotid bruit, no JVD, and thyroid not enlarged, symmetric, no tenderness/mass/nodules Lungs:  clear to auscultation bilaterally Heart: regular rate and rhythm Abdomen: soft, non-tender; bowel sounds normal; no masses,  no organomegaly Extremities: extremities  normal, atraumatic, no cyanosis or edema Pulses: 2+ and symmetric Skin: Skin color, texture, turgor normal. No rashes or lesions Neurologic: Grossly normal Psych: Mildly anxious  EKG: Normal sinus rhythm at 68, nonspecific T wave changes- personally reviewed  ASSESSMENT: Probable esophageal spasm Minimal coronary disease by CT coronary angiography (06/2022) GERD  PLAN: 1.   Ms. Antolik had an episode which sounds like esophageal spasm.  She had associated vagal symptoms including nausea and diaphoresis.  There was a squeezing pressure in the center of her chest that radiated up to her throat.  It sounds like she might be intermittently taking her reflux medication.  This was discussed at some degree with Dr. Hilarie Fredrickson last month.  She has already been set up for manometry and pH monitoring in June.  She is planning to go out of town for several visits and might have recurrent episodes of this.  I will prescribe short acting sublingual nitroglycerin which should give her some benefit if she has a persistent episode of discomfort.  She is advised only to take 1 as needed as she tends to run a lower blood pressure.  She should stay well-hydrated.  She should also avoid foods which are spicy or it may increase her chance of reflux and continue compliance with her reflux medications.  I will copy Dr. Hilarie Fredrickson on this email as well.  My suspicion for coronary etiology is very low based on her low risk coronary CT back in September 2023.  Follow-up with Dr. Gardiner Rhyme as scheduled.  Pixie Casino, MD, [facility] [place] Director of the Advanced Lipid Disorders &  Cardiovascular Risk Reduction [facility] Diplomate of the American Board of Clinical Lipidology Attending Cardiologist  Direct Dial: [phone]  Fax:  [phone]  Website:  [email or web address] 01/11/2023, 9:56 AM

## 2023-01-21 DIAGNOSIS — N281 Cyst of kidney, acquired: Secondary | ICD-10-CM | POA: Diagnosis not present

## 2023-01-21 DIAGNOSIS — N1330 Unspecified hydronephrosis: Secondary | ICD-10-CM | POA: Diagnosis not present

## 2023-01-21 DIAGNOSIS — N3946 Mixed incontinence: Secondary | ICD-10-CM | POA: Diagnosis not present

## 2023-01-27 ENCOUNTER — Encounter: Payer: Self-pay | Admitting: Physical Therapy

## 2023-01-27 ENCOUNTER — Ambulatory Visit: Payer: PPO | Attending: Internal Medicine | Admitting: Physical Therapy

## 2023-01-27 ENCOUNTER — Other Ambulatory Visit: Payer: Self-pay

## 2023-01-27 DIAGNOSIS — R293 Abnormal posture: Secondary | ICD-10-CM | POA: Insufficient documentation

## 2023-01-27 DIAGNOSIS — R279 Unspecified lack of coordination: Secondary | ICD-10-CM | POA: Diagnosis not present

## 2023-01-27 DIAGNOSIS — M6281 Muscle weakness (generalized): Secondary | ICD-10-CM | POA: Diagnosis not present
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
## 2023-01-27 NOTE — Addendum Note (Signed)
Addended by: Barbaraann Faster on: 01/27/2023 04:43 PM   Modules accepted: Orders

## 2023-02-06 ENCOUNTER — Encounter: Payer: Self-pay | Admitting: Internal Medicine

## 2023-02-08 ENCOUNTER — Ambulatory Visit: Payer: PPO | Admitting: Physical Therapy

## 2023-02-08 DIAGNOSIS — M6281 Muscle weakness (generalized): Secondary | ICD-10-CM | POA: Diagnosis not present

## 2023-02-08 DIAGNOSIS — R293 Abnormal posture: Secondary | ICD-10-CM

## 2023-02-08 DIAGNOSIS — R279 Unspecified lack of coordination: Secondary | ICD-10-CM

## 2023-02-08 NOTE — Therapy (Signed)
OUTPATIENT PHYSICAL THERAPY FEMALE PELVIC EVALUATION   Patient Name: Regina [name] MRN: [id number] DOB:May 20, 1941, 82 y.o., female Today's Date: 02/08/2023  END OF SESSION:  PT End of Session - 02/08/23 1448     Visit Number 2    Date for PT Re-Evaluation 04/28/23    Authorization Type [employer]    PT Start Time 1445    PT Stop Time 1532    PT Time Calculation (min) 47 min    Activity Tolerance Patient tolerated treatment well    Behavior During Therapy WFL for tasks assessed/performed             Past Medical History:  Diagnosis Date   Diverticulosis    GERD (gastroesophageal reflux disease)    Headache    Heterozygous factor V Leiden mutation (HCC)    Hiatal hernia    Insomnia    Nervous system disorder    spinal cord tethered   Neurogenic bladder    Osteoporosis    Osteoporosis    Tubular adenoma of colon    Urinary incontinence    Past Surgical History:  Procedure Laterality Date   dental implants     PARTIAL HYSTERECTOMY  10/12/1994   SALPINGOOPHORECTOMY Right    TUBAL LIGATION     Patient Active Problem List   Diagnosis Date Noted   Left buttock pain 11/14/2020   Myofascial pain 11/14/2020   Piriformis syndrome of left side 05/07/2020   Family history of glaucoma 03/29/2019   Meibomian gland dysfunction (MGD) of both eyes 03/29/2019   Vitreous syneresis of both eyes 03/29/2019   Open angle with borderline findings and low glaucoma risk in both eyes 03/02/2018   Dysphonia 01/03/2018   Post-nasal drainage 01/03/2018   Heterozygous factor V Leiden mutation (HCC) 05/28/2016   Amaurosis fugax of left eye 05/28/2016   Migraine aura without headache 04/13/2016   Dermatochalasis of both upper eyelids 08/01/2015   Asthma, mild intermittent 06/05/2014   Cough 03/20/2014   Lipoma 02/22/2013    PCP: [name], MD  REFERRING PROVIDER: Beverley Fiedler, MD    REFERRING DIAG: M62.89 (ICD-10-CM) - Pelvic floor dysfunction  THERAPY  DIAG:  Muscle weakness (generalized)  Unspecified lack of coordination  Abnormal posture  Rationale for Evaluation and Treatment: Rehabilitation  ONSET DATE: years ago  SUBJECTIVE:                                                                                                                                                                                           SUBJECTIVE STATEMENT: Pt reports leakage is still the same and has been doing HEP so far.  PAIN:  No - dental surgery last week and sore from this but not    PRECAUTIONS: None  WEIGHT BEARING RESTRICTIONS: No  FALLS:  Has patient fallen in last 6 months? No  LIVING ENVIRONMENT: Lives with: lives with their family Lives in: House/apartment   OCCUPATION: self employed   PLOF: Independent  PATIENT GOALS: to have to less leakage and improved bowel habits  PERTINENT HISTORY:  GERD, adenomatous colon polyps, chronic constipation, colonic diverticulosis, neurogenic bladder, tethered spinal cord, chronic productive cough, Osteoporosis   BOWEL MOVEMENT: Pain with bowel movement: No Type of bowel movement:Type (Bristol Stool Scale) 3-4, Frequency daily, and Strain Yes Fully empty rectum: No Leakage: Yes: "it's like I didn't get it all out and later it moves". Has had 1-2 accidents in the past  Pads: No Fiber supplement: No - but takes magnesium  Feels like she needs to URINATION: Pain with urination: No Fully empty bladder: No :much better with medication but still has a couple instances of not feeling empty Stream: Strong Urgency: Yes:   Frequency: not quicker than every two hours, 1x at night Leakage: Coughing Pads: Yes: with a lot of travel  INTERCOURSE: Pain with intercourse:  not painful  Ability to have vaginal penetration:  Yes:   Climax: strong contraction felt and uncomfortable - usually a large loss of urine, notices less leakage with urinating before  Marinoff Scale:  0/3  PREGNANCY: Vaginal deliveries 0 Tearing No C-section deliveries 0 Currently pregnant No  PROLAPSE: None   OBJECTIVE:   DIAGNOSTIC FINDINGS:    COGNITION: Overall cognitive status: Within functional limits for tasks assessed     SENSATION: Light touch: Appears intact Proprioception: Appears intact  MUSCLE LENGTH: Bil hips and adductors limited by 25%     POSTURE: rounded shoulders and posterior pelvic tilt   LUMBARAROM/PROM:  A/PROM A/PROM  eval  Flexion WFL  Extension WFL  Right lateral flexion Limited by 25%  Left lateral flexion Limited by 25%  Right rotation WFL  Left rotation WFL   (Blank rows = not tested)  LOWER EXTREMITY ROM:  WFL  LOWER EXTREMITY MMT:  Bil hips grossly 4/5 with exception of Lt hip abduction 3/5, knees 5/5 PALPATION:   General  no TTP reported by pt during assessment                 External Perineal Exam WFL, mild dryness noted, decreased estrogen                              Internal Pelvic Floor no TTP   Patient confirms identification and approves PT to assess internal pelvic floor and treatment Yes  PELVIC MMT:   MMT eval  Vaginal 2/5 Lt and 3/5 Rt side of pelvic floor, 4s, 6 reps  Internal Anal Sphincter 2/5LT varied between 2/5-3/5 in all other quadrants  External Anal Sphincter 2/5LT varied between 2/5-3/5 in all other quadrants  Puborectalis 2/5LT varied between 2/5-3/5 in all other quadrants  Diastasis Recti   (Blank rows = not tested)        TONE: Decreased on Lt side more so than Rt  PROLAPSE: Not seen in hooklying   TODAY'S TREATMENT:  DATE:   01/27/2023 EVAL Examination completed, findings reviewed, pt educated on POC, proper pelvic floor contraction and breathing mechanics with NMRE for tapping to improve contraction and technique. Pt motivated to participate in PT and  agreeable to attempt recommendations.    02/08/23:  Patient consented to internal pelvic floor assessment rectally this date and found to have decreased strength throughout but more so in Lt than other. Patient benefited from verbal cues and NMRE with tapping for improved technique with pelvic floor contractions and increased strength to 3/5 throughout all quadrants with this. X10 contractions completed and x5 isometrics completed with ability to hold consistently at 3/5 for ~15s then mild decrease to 2/5 for 15s. Pt tolerated well without pain, and actively participating throughout. Pt given HEP reviewed in session, no additional questions Pt educated on voiding mechanics with step stool for bowel movements, and balloon breathing technique to use instead of straining.    PATIENT EDUCATION:  Education details: [id number] Person educated: Patient Education method: Explanation, Demonstration, Tactile cues, Verbal cues, and Handouts Education comprehension: verbalized understanding and returned demonstration  HOME EXERCISE PROGRAM: [id number]  ASSESSMENT:  CLINICAL IMPRESSION: Patient presents for treatment today, HEP given, all questions answered. Pt session focused on rectal assessment and treatment today. Findings above. Pt tolerated well denied pain and consented to internal treatment and assessment. Pt benefited from cues for technique with contractions, did demonstrate weakness throughout Lt more than other quadrants, also mild tension noted when cued to bear down then able to relax and bulge. Pt would benefit from additional PT to further address deficits.       OBJECTIVE IMPAIRMENTS: decreased coordination, decreased endurance, decreased strength, increased fascial restrictions, increased muscle spasms, impaired flexibility, improper body mechanics, postural dysfunction, and pain.   ACTIVITY LIMITATIONS: continence and laying  PARTICIPATION LIMITATIONS: community activity  PERSONAL  FACTORS: 1 comorbidity: medical history   are also affecting patient's functional outcome.   REHAB POTENTIAL: Good  CLINICAL DECISION MAKING: Evolving/moderate complexity  EVALUATION COMPLEXITY: Moderate   GOALS: Goals reviewed with patient? Yes  SHORT TERM GOALS: Target date: 02/24/23  Pt to be I with advanced HEP.  Baseline Goal status: INITIAL  2.  Pt will report no more than 6/10 pain at highest due to improvements in posture, strength, and muscle length  Baseline:  Goal status: INITIAL  3.  Pt will report her BMs are complete due to improved bowel habits and evacuation techniques.  Baseline:  Goal status: INITIAL    LONG TERM GOALS: Target date: 04/28/23  Pt to be I with advanced HEP.  Baseline:  Goal status: INITIAL  2.  Pt to demonstrate at least 5/5 bil hip strength for improved pelvic stability and functional squats without leakage.  Baseline:  Goal status: INITIAL  3.  Pt to demonstrate at least 4/5 pelvic floor strength for improved pelvic stability and decreased strain at pelvic floor/ decrease leakage.  Baseline:  Goal status: INITIAL  4.  Pt will report no more than 4/10 pain at highest due to improvements in posture, strength, and muscle length  Baseline:  Goal status: INITIAL   PLAN:  PT FREQUENCY: 1x/week  PT DURATION: other: 1x per week for 4 visits then every other week for 3   PLANNED INTERVENTIONS: Therapeutic exercises, Therapeutic activity, Neuromuscular re-education, Balance training, Gait training, Patient/Family education, Self Care, Aquatic Therapy, Dry Needling, Electrical stimulation, Spinal mobilization, Cryotherapy, Moist heat, scar mobilization, Taping, Biofeedback, and Manual therapy  PLAN FOR NEXT SESSION: manual at pelvic floor for  improved strength and coordination, coordination of breathing and pelvic floor and strengthening exercises, voiding mechanics  Otelia Sergeant, PT, DPT 04/29/243:46 PM

## 2023-02-09 MED ORDER — FAMOTIDINE 20 MG PO TABS: 20.0000 mg | ORAL_TABLET | Freq: Every evening | ORAL | 1 refills | Status: DC | PRN

## 2023-02-09 NOTE — Telephone Encounter (Signed)
Would refill and have her continue with this espec if it would helpful for her symptoms, cough, etc

## 2023-02-10 DIAGNOSIS — D1801 Hemangioma of skin and subcutaneous tissue: Secondary | ICD-10-CM | POA: Diagnosis not present

## 2023-02-10 DIAGNOSIS — L738 Other specified follicular disorders: Secondary | ICD-10-CM | POA: Diagnosis not present

## 2023-02-10 DIAGNOSIS — I788 Other diseases of capillaries: Secondary | ICD-10-CM | POA: Diagnosis not present

## 2023-02-10 DIAGNOSIS — Z85828 Personal history of other malignant neoplasm of skin: Secondary | ICD-10-CM | POA: Diagnosis not present

## 2023-02-10 DIAGNOSIS — B351 Tinea unguium: Secondary | ICD-10-CM | POA: Diagnosis not present

## 2023-02-10 DIAGNOSIS — D2272 Melanocytic nevi of left lower limb, including hip: Secondary | ICD-10-CM | POA: Diagnosis not present

## 2023-02-10 DIAGNOSIS — L820 Inflamed seborrheic keratosis: Secondary | ICD-10-CM | POA: Diagnosis not present

## 2023-02-10 DIAGNOSIS — L218 Other seborrheic dermatitis: Secondary | ICD-10-CM | POA: Diagnosis not present

## 2023-02-10 DIAGNOSIS — D2261 Melanocytic nevi of right upper limb, including shoulder: Secondary | ICD-10-CM | POA: Diagnosis not present

## 2023-02-10 DIAGNOSIS — L821 Other seborrheic keratosis: Secondary | ICD-10-CM | POA: Diagnosis not present

## 2023-02-10 DIAGNOSIS — L814 Other melanin hyperpigmentation: Secondary | ICD-10-CM | POA: Diagnosis not present

## 2023-02-25 ENCOUNTER — Ambulatory Visit: Payer: PPO | Attending: Internal Medicine | Admitting: Physical Therapy

## 2023-02-25 DIAGNOSIS — R279 Unspecified lack of coordination: Secondary | ICD-10-CM

## 2023-02-25 DIAGNOSIS — M6281 Muscle weakness (generalized): Secondary | ICD-10-CM | POA: Diagnosis not present

## 2023-02-25 DIAGNOSIS — R293 Abnormal posture: Secondary | ICD-10-CM | POA: Insufficient documentation

## 2023-02-25 NOTE — Therapy (Signed)
OUTPATIENT PHYSICAL THERAPY FEMALE PELVIC TREATMENT   Patient Name: Regina [name] MRN: [id number] DOB:08-28-1941, 82 y.o., female Today's Date: 02/25/2023  END OF SESSION:  PT End of Session - 02/25/23 1623     Visit Number 3    Date for PT Re-Evaluation 04/28/23    Authorization Type [employer]    PT Start Time 1620    PT Stop Time 1645    PT Time Calculation (min) 25 min    Activity Tolerance Patient tolerated treatment well    Behavior During Therapy WFL for tasks assessed/performed              Past Medical History:  Diagnosis Date   Diverticulosis    GERD (gastroesophageal reflux disease)    Headache    Heterozygous factor V Leiden mutation (HCC)    Hiatal hernia    Insomnia    Nervous system disorder    spinal cord tethered   Neurogenic bladder    Osteoporosis    Osteoporosis    Tubular adenoma of colon    Urinary incontinence    Past Surgical History:  Procedure Laterality Date   dental implants     PARTIAL HYSTERECTOMY  10/12/1994   SALPINGOOPHORECTOMY Right    TUBAL LIGATION     Patient Active Problem List   Diagnosis Date Noted   Left buttock pain 11/14/2020   Myofascial pain 11/14/2020   Piriformis syndrome of left side 05/07/2020   Family history of glaucoma 03/29/2019   Meibomian gland dysfunction (MGD) of both eyes 03/29/2019   Vitreous syneresis of both eyes 03/29/2019   Open angle with borderline findings and low glaucoma risk in both eyes 03/02/2018   Dysphonia 01/03/2018   Post-nasal drainage 01/03/2018   Heterozygous factor V Leiden mutation (HCC) 05/28/2016   Amaurosis fugax of left eye 05/28/2016   Migraine aura without headache 04/13/2016   Dermatochalasis of both upper eyelids 08/01/2015   Asthma, mild intermittent 06/05/2014   Cough 03/20/2014   Lipoma 02/22/2013    PCP: [name], MD  REFERRING PROVIDER: Beverley Fiedler, MD    REFERRING DIAG: M62.89 (ICD-10-CM) - Pelvic floor dysfunction  THERAPY  DIAG:  No diagnosis found.  Rationale for Evaluation and Treatment: Rehabilitation  ONSET DATE: years ago  SUBJECTIVE:                                                                                                                                                                                           SUBJECTIVE STATEMENT: Pt reports she had one small instance of fecal leakage after traveling to [place] after a lot of walking, did almost  made it and was able to hold it most of the time and had leak right before getting to the bathroom. Was constipated during stay and had a bowel movement when returning home after traveling.     PAIN:  No - dental surgery last week and sore from this but not    PRECAUTIONS: None  WEIGHT BEARING RESTRICTIONS: No  FALLS:  Has patient fallen in last 6 months? No  LIVING ENVIRONMENT: Lives with: lives with their family Lives in: House/apartment   OCCUPATION: self employed   PLOF: Independent  PATIENT GOALS: to have to less leakage and improved bowel habits  PERTINENT HISTORY:  GERD, adenomatous colon polyps, chronic constipation, colonic diverticulosis, neurogenic bladder, tethered spinal cord, chronic productive cough, Osteoporosis   BOWEL MOVEMENT: Pain with bowel movement: No Type of bowel movement:Type (Bristol Stool Scale) 3-4, Frequency daily, and Strain Yes Fully empty rectum: No Leakage: Yes: "it's like I didn't get it all out and later it moves". Has had 1-2 accidents in the past  Pads: No Fiber supplement: No - but takes magnesium  Feels like she needs to URINATION: Pain with urination: No Fully empty bladder: No :much better with medication but still has a couple instances of not feeling empty Stream: Strong Urgency: Yes:   Frequency: not quicker than every two hours, 1x at night Leakage: Coughing Pads: Yes: with a lot of travel  INTERCOURSE: Pain with intercourse:  not painful  Ability to have vaginal penetration:   Yes:   Climax: strong contraction felt and uncomfortable - usually a large loss of urine, notices less leakage with urinating before  Marinoff Scale: 0/3  PREGNANCY: Vaginal deliveries 0 Tearing No C-section deliveries 0 Currently pregnant No  PROLAPSE: None   OBJECTIVE:   DIAGNOSTIC FINDINGS:    COGNITION: Overall cognitive status: Within functional limits for tasks assessed     SENSATION: Light touch: Appears intact Proprioception: Appears intact  MUSCLE LENGTH: Bil hips and adductors limited by 25%     POSTURE: rounded shoulders and posterior pelvic tilt   LUMBARAROM/PROM:  A/PROM A/PROM  eval  Flexion WFL  Extension WFL  Right lateral flexion Limited by 25%  Left lateral flexion Limited by 25%  Right rotation WFL  Left rotation WFL   (Blank rows = not tested)  LOWER EXTREMITY ROM:  WFL  LOWER EXTREMITY MMT:  Bil hips grossly 4/5 with exception of Lt hip abduction 3/5, knees 5/5 PALPATION:   General  no TTP reported by pt during assessment                 External Perineal Exam WFL, mild dryness noted, decreased estrogen                              Internal Pelvic Floor no TTP   Patient confirms identification and approves PT to assess internal pelvic floor and treatment Yes  PELVIC MMT:   MMT eval  Vaginal 2/5 Lt and 3/5 Rt side of pelvic floor, 4s, 6 reps  Internal Anal Sphincter 2/5LT varied between 2/5-3/5 in all other quadrants  External Anal Sphincter 2/5LT varied between 2/5-3/5 in all other quadrants  Puborectalis 2/5LT varied between 2/5-3/5 in all other quadrants  Diastasis Recti   (Blank rows = not tested)        TONE: Decreased on Lt side more so than Rt  PROLAPSE: Not seen in hooklying   TODAY'S TREATMENT:  DATE:    02/08/23:  Patient consented to internal pelvic floor assessment rectally this  date and found to have decreased strength throughout but more so in Lt than other. Patient benefited from verbal cues and NMRE with tapping for improved technique with pelvic floor contractions and increased strength to 3/5 throughout all quadrants with this. X10 contractions completed and x5 isometrics completed with ability to hold consistently at 3/5 for ~15s then mild decrease to 2/5 for 15s. Pt tolerated well without pain, and actively participating throughout. Pt given HEP reviewed in session, no additional questions Pt educated on voiding mechanics with step stool for bowel movements, and balloon breathing technique to use instead of straining.    02/25/23 : HEP updated, pt did one rep of all exercises to improve carry over for home and encouraged to engage pelvic floor throughout.  Education given on POC, continuing pelvic floor exercises.    PATIENT EDUCATION:  Education details: [id number] Person educated: Patient Education method: Explanation, Demonstration, Tactile cues, Verbal cues, and Handouts Education comprehension: verbalized understanding and returned demonstration  HOME EXERCISE PROGRAM: [email or web address]  ASSESSMENT:  CLINICAL IMPRESSION: Patient presents for treatment today, HEP given, all questions answered. Pt session focused on education and updating HEP and reviewing with pt. Pt would benefit from additional PT to further address deficits.       OBJECTIVE IMPAIRMENTS: decreased coordination, decreased endurance, decreased strength, increased fascial restrictions, increased muscle spasms, impaired flexibility, improper body mechanics, postural dysfunction, and pain.   ACTIVITY LIMITATIONS: continence and laying  PARTICIPATION LIMITATIONS: community activity  PERSONAL FACTORS: 1 comorbidity: medical history   are also affecting patient's functional outcome.   REHAB POTENTIAL: Good  CLINICAL DECISION MAKING: Evolving/moderate complexity  EVALUATION COMPLEXITY:  Moderate   GOALS: Goals reviewed with patient? Yes  SHORT TERM GOALS: Target date: 02/24/23  Pt to be I with advanced HEP.  Baseline Goal status: INITIAL  2.  Pt will report no more than 6/10 pain at highest due to improvements in posture, strength, and muscle length  Baseline:  Goal status: INITIAL  3.  Pt will report her BMs are complete due to improved bowel habits and evacuation techniques.  Baseline:  Goal status: INITIAL    LONG TERM GOALS: Target date: 04/28/23  Pt to be I with advanced HEP.  Baseline:  Goal status: INITIAL  2.  Pt to demonstrate at least 5/5 bil hip strength for improved pelvic stability and functional squats without leakage.  Baseline:  Goal status: INITIAL  3.  Pt to demonstrate at least 4/5 pelvic floor strength for improved pelvic stability and decreased strain at pelvic floor/ decrease leakage.  Baseline:  Goal status: INITIAL  4.  Pt will report no more than 4/10 pain at highest due to improvements in posture, strength, and muscle length  Baseline:  Goal status: INITIAL   PLAN:  PT FREQUENCY: 1x/week  PT DURATION: other: 1x per week for 4 visits then every other week for 3   PLANNED INTERVENTIONS: Therapeutic exercises, Therapeutic activity, Neuromuscular re-education, Balance training, Gait training, Patient/Family education, Self Care, Aquatic Therapy, Dry Needling, Electrical stimulation, Spinal mobilization, Cryotherapy, Moist heat, scar mobilization, Taping, Biofeedback, and Manual therapy  PLAN FOR NEXT SESSION: manual at pelvic floor for improved strength and coordination, coordination of breathing and pelvic floor and strengthening exercises, voiding mechanics  Otelia Sergeant, PT, DPT 05/16/244:59 PM

## 2023-03-02 ENCOUNTER — Ambulatory Visit: Payer: PPO | Admitting: Physical Therapy

## 2023-03-04 DIAGNOSIS — M1712 Unilateral primary osteoarthritis, left knee: Secondary | ICD-10-CM | POA: Diagnosis not present

## 2023-03-10 ENCOUNTER — Ambulatory Visit: Payer: PPO | Admitting: Physical Therapy

## 2023-03-16 ENCOUNTER — Ambulatory Visit: Payer: PPO | Attending: Internal Medicine | Admitting: Physical Therapy

## 2023-03-16 DIAGNOSIS — M5459 Other low back pain: Secondary | ICD-10-CM | POA: Insufficient documentation

## 2023-03-16 DIAGNOSIS — R293 Abnormal posture: Secondary | ICD-10-CM | POA: Insufficient documentation

## 2023-03-16 DIAGNOSIS — M6281 Muscle weakness (generalized): Secondary | ICD-10-CM | POA: Diagnosis not present

## 2023-03-16 DIAGNOSIS — R279 Unspecified lack of coordination: Secondary | ICD-10-CM | POA: Diagnosis not present

## 2023-03-16 NOTE — Therapy (Signed)
OUTPATIENT PHYSICAL THERAPY FEMALE PELVIC TREATMENT   Patient Name: Regina [name] MRN: [id number] DOB:[birth year], 82 y.o., female Today's Date: 03/16/2023  END OF SESSION:  PT End of Session - 03/16/23 1151     Visit Number 4    Date for PT Re-Evaluation 04/28/23    Authorization Type [employer]    PT Start Time 1148    PT Stop Time 1228    PT Time Calculation (min) 40 min    Activity Tolerance Patient tolerated treatment well    Behavior During Therapy WFL for tasks assessed/performed              Past Medical History:  Diagnosis Date   Diverticulosis    GERD (gastroesophageal reflux disease)    Headache    Heterozygous factor V Leiden mutation (HCC)    Hiatal hernia    Insomnia    Nervous system disorder    spinal cord tethered   Neurogenic bladder    Osteoporosis    Osteoporosis    Tubular adenoma of colon    Urinary incontinence    Past Surgical History:  Procedure Laterality Date   dental implants     PARTIAL HYSTERECTOMY  10/12/1994   SALPINGOOPHORECTOMY Right    TUBAL LIGATION     Patient Active Problem List   Diagnosis Date Noted   Left buttock pain 11/14/2020   Myofascial pain 11/14/2020   Piriformis syndrome of left side 05/07/2020   Family history of glaucoma 03/29/2019   Meibomian gland dysfunction (MGD) of both eyes 03/29/2019   Vitreous syneresis of both eyes 03/29/2019   Open angle with borderline findings and low glaucoma risk in both eyes 03/02/2018   Dysphonia 01/03/2018   Post-nasal drainage 01/03/2018   Heterozygous factor V Leiden mutation (HCC) 05/28/2016   Amaurosis fugax of left eye 05/28/2016   Migraine aura without headache 04/13/2016   Dermatochalasis of both upper eyelids 08/01/2015   Asthma, mild intermittent 06/05/2014   Cough 03/20/2014   Lipoma 02/22/2013    PCP: [name], MD  REFERRING PROVIDER: Beverley Fiedler, MD    REFERRING DIAG: M62.89 (ICD-10-CM) - Pelvic floor dysfunction  THERAPY  DIAG:  Muscle weakness (generalized)  Unspecified lack of coordination  Abnormal posture  Other low back pain  Rationale for Evaluation and Treatment: Rehabilitation  ONSET DATE: years ago  SUBJECTIVE:                                                                                                                                                                                           SUBJECTIVE STATEMENT: Pt reports she is going to get a  shot in her Rt hip for pain, has seen MD for this. Myrbetriq has cleared urinary leakage and pleased with this. Pt reports she did have an instance fecal incontinence during sleep. Woke up with having lost stool, unaware of urge to have bowel movement or that leakage had occurred.    PAIN:  No pain today   PRECAUTIONS: None  WEIGHT BEARING RESTRICTIONS: No  FALLS:  Has patient fallen in last 6 months? No  LIVING ENVIRONMENT: Lives with: lives with their family Lives in: House/apartment   OCCUPATION: self employed   PLOF: Independent  PATIENT GOALS: to have to less leakage and improved bowel habits  PERTINENT HISTORY:  GERD, adenomatous colon polyps, chronic constipation, colonic diverticulosis, neurogenic bladder, tethered spinal cord, chronic productive cough, Osteoporosis   BOWEL MOVEMENT: Pain with bowel movement: No Type of bowel movement:Type (Bristol Stool Scale) 3-4, Frequency daily, and Strain Yes Fully empty rectum: No Leakage: Yes: "it's like I didn't get it all out and later it moves". Has had 1-2 accidents in the past  Pads: No Fiber supplement: No - but takes magnesium  Feels like she needs to URINATION: Pain with urination: No Fully empty bladder: No :much better with medication but still has a couple instances of not feeling empty Stream: Strong Urgency: Yes:   Frequency: not quicker than every two hours, 1x at night Leakage: Coughing Pads: Yes: with a lot of travel  INTERCOURSE: Pain with intercourse:   not painful  Ability to have vaginal penetration:  Yes:   Climax: strong contraction felt and uncomfortable - usually a large loss of urine, notices less leakage with urinating before  Marinoff Scale: 0/3  PREGNANCY: Vaginal deliveries 0 Tearing No C-section deliveries 0 Currently pregnant No  PROLAPSE: None   OBJECTIVE:   DIAGNOSTIC FINDINGS:    COGNITION: Overall cognitive status: Within functional limits for tasks assessed     SENSATION: Light touch: Appears intact Proprioception: Appears intact  MUSCLE LENGTH: Bil hips and adductors limited by 25%     POSTURE: rounded shoulders and posterior pelvic tilt   LUMBARAROM/PROM:  A/PROM A/PROM  eval  Flexion WFL  Extension WFL  Right lateral flexion Limited by 25%  Left lateral flexion Limited by 25%  Right rotation WFL  Left rotation WFL   (Blank rows = not tested)  LOWER EXTREMITY ROM:  WFL  LOWER EXTREMITY MMT:  Bil hips grossly 4/5 with exception of Lt hip abduction 3/5, knees 5/5 PALPATION:   General  no TTP reported by pt during assessment                 External Perineal Exam WFL, mild dryness noted, decreased estrogen                              Internal Pelvic Floor no TTP   Patient confirms identification and approves PT to assess internal pelvic floor and treatment Yes  PELVIC MMT:   MMT eval  Vaginal 2/5 Lt and 3/5 Rt side of pelvic floor, 4s, 6 reps  Internal Anal Sphincter 2/5LT varied between 2/5-3/5 in all other quadrants  External Anal Sphincter 2/5LT varied between 2/5-3/5 in all other quadrants  Puborectalis 2/5LT varied between 2/5-3/5 in all other quadrants  Diastasis Recti   (Blank rows = not tested)        TONE: Decreased on Lt side more so than Rt  PROLAPSE: Not seen in hooklying   TODAY'S TREATMENT:  DATE:    03/16/2023: NMRE: all  exercises cued for coordination of pelvic floor and breathing with activity to decreased leakage and improved pelvic floor strength.  Attempted modified single stand however unable on Lt side, able to do 5 reps X10 Sit to stand from lowest setting with cues for glutes X15  bridges X10 palloffs blue band      PATIENT EDUCATION:  Education details: [email or web address] Person educated: Patient Education method: Explanation, Demonstration, Tactile cues, Verbal cues, and Handouts Education comprehension: verbalized understanding and returned demonstration  HOME EXERCISE PROGRAM: [email or web address]  ASSESSMENT:  CLINICAL IMPRESSION: Patient presents for treatment today, HEP updated, all questions answered. Pt session focused on  coordination and strengthening of core/hips/pelvic floor. Pt does continue to have weakness at Lt side compared to right, but motivated to participate. Benefits from cues for technique to improve strength of pelvic floor to decrease leakage. Pt would benefit from additional PT to further address deficits.       OBJECTIVE IMPAIRMENTS: decreased coordination, decreased endurance, decreased strength, increased fascial restrictions, increased muscle spasms, impaired flexibility, improper body mechanics, postural dysfunction, and pain.   ACTIVITY LIMITATIONS: continence and laying  PARTICIPATION LIMITATIONS: community activity  PERSONAL FACTORS: 1 comorbidity: medical history   are also affecting patient's functional outcome.   REHAB POTENTIAL: Good  CLINICAL DECISION MAKING: Evolving/moderate complexity  EVALUATION COMPLEXITY: Moderate   GOALS: Goals reviewed with patient? Yes  SHORT TERM GOALS: Target date: 02/24/23  Pt to be I with advanced HEP.  Baseline Goal status: INITIAL  2.  Pt will report no more than 6/10 pain at highest due to improvements in posture, strength, and muscle length  Baseline:  Goal status: INITIAL  3.  Pt will report her BMs are complete due to  improved bowel habits and evacuation techniques.  Baseline:  Goal status: INITIAL    LONG TERM GOALS: Target date: 04/28/23  Pt to be I with advanced HEP.  Baseline:  Goal status: INITIAL  2.  Pt to demonstrate at least 5/5 bil hip strength for improved pelvic stability and functional squats without leakage.  Baseline:  Goal status: INITIAL  3.  Pt to demonstrate at least 4/5 pelvic floor strength for improved pelvic stability and decreased strain at pelvic floor/ decrease leakage.  Baseline:  Goal status: INITIAL  4.  Pt will report no more than 4/10 pain at highest due to improvements in posture, strength, and muscle length  Baseline:  Goal status: INITIAL   PLAN:  PT FREQUENCY: 1x/week  PT DURATION: other: 1x per week for 4 visits then every other week for 3   PLANNED INTERVENTIONS: Therapeutic exercises, Therapeutic activity, Neuromuscular re-education, Balance training, Gait training, Patient/Family education, Self Care, Aquatic Therapy, Dry Needling, Electrical stimulation, Spinal mobilization, Cryotherapy, Moist heat, scar mobilization, Taping, Biofeedback, and Manual therapy  PLAN FOR NEXT SESSION: manual at pelvic floor for improved strength and coordination, coordination of breathing and pelvic floor and strengthening exercises, voiding mechanics  Otelia Sergeant, PT, DPT 06/04/245:03 PM

## 2023-03-18 DIAGNOSIS — M25552 Pain in left hip: Secondary | ICD-10-CM | POA: Diagnosis not present

## 2023-03-23 ENCOUNTER — Ambulatory Visit: Payer: PPO | Admitting: Physical Therapy

## 2023-03-23 DIAGNOSIS — M6281 Muscle weakness (generalized): Secondary | ICD-10-CM

## 2023-03-23 DIAGNOSIS — R293 Abnormal posture: Secondary | ICD-10-CM

## 2023-03-23 DIAGNOSIS — R279 Unspecified lack of coordination: Secondary | ICD-10-CM

## 2023-03-23 NOTE — Therapy (Signed)
OUTPATIENT PHYSICAL THERAPY FEMALE PELVIC TREATMENT   Patient Name: Regina [name] MRN: [id number] DOB:Sep 27, 1941, 82 y.o., female Today's Date: 03/23/2023  END OF SESSION:  PT End of Session - 03/23/23 1147     Visit Number 5    Date for PT Re-Evaluation 04/28/23    Authorization Type [employer]    PT Start Time 1147    PT Stop Time 1230    PT Time Calculation (min) 43 min    Activity Tolerance Patient tolerated treatment well    Behavior During Therapy WFL for tasks assessed/performed              Past Medical History:  Diagnosis Date   Diverticulosis    GERD (gastroesophageal reflux disease)    Headache    Heterozygous factor V Leiden mutation (HCC)    Hiatal hernia    Insomnia    Nervous system disorder    spinal cord tethered   Neurogenic bladder    Osteoporosis    Osteoporosis    Tubular adenoma of colon    Urinary incontinence    Past Surgical History:  Procedure Laterality Date   dental implants     PARTIAL HYSTERECTOMY  10/12/1994   SALPINGOOPHORECTOMY Right    TUBAL LIGATION     Patient Active Problem List   Diagnosis Date Noted   Left buttock pain 11/14/2020   Myofascial pain 11/14/2020   Piriformis syndrome of left side 05/07/2020   Family history of glaucoma 03/29/2019   Meibomian gland dysfunction (MGD) of both eyes 03/29/2019   Vitreous syneresis of both eyes 03/29/2019   Open angle with borderline findings and low glaucoma risk in both eyes 03/02/2018   Dysphonia 01/03/2018   Post-nasal drainage 01/03/2018   Heterozygous factor V Leiden mutation (HCC) 05/28/2016   Amaurosis fugax of left eye 05/28/2016   Migraine aura without headache 04/13/2016   Dermatochalasis of both upper eyelids 08/01/2015   Asthma, mild intermittent 06/05/2014   Cough 03/20/2014   Lipoma 02/22/2013    PCP: [name], MD  REFERRING PROVIDER: Beverley Fiedler, MD    REFERRING DIAG: M62.89 (ICD-10-CM) - Pelvic floor dysfunction  THERAPY  DIAG:  Muscle weakness (generalized)  Unspecified lack of coordination  Abnormal posture  Rationale for Evaluation and Treatment: Rehabilitation  ONSET DATE: years ago  SUBJECTIVE:                                                                                                                                                                                           SUBJECTIVE STATEMENT: Pt reports she feels like she's a little bit better, states she feels  like she has a lot of other things going out outside of therapy and may need to space out appointments. Did have a little instance of leakage since last visit Has started having Lt knee pain at medial patella as she pointed to it.  Plans to have "shock therapy" on Lt hip Friday consulation  PAIN:  No pain today   PRECAUTIONS: None  WEIGHT BEARING RESTRICTIONS: No  FALLS:  Has patient fallen in last 6 months? No  LIVING ENVIRONMENT: Lives with: lives with their family Lives in: House/apartment   OCCUPATION: self employed   PLOF: Independent  PATIENT GOALS: to have to less leakage and improved bowel habits  PERTINENT HISTORY:  GERD, adenomatous colon polyps, chronic constipation, colonic diverticulosis, neurogenic bladder, tethered spinal cord, chronic productive cough, Osteoporosis   BOWEL MOVEMENT: Pain with bowel movement: No Type of bowel movement:Type (Bristol Stool Scale) 3-4, Frequency daily, and Strain Yes Fully empty rectum: No Leakage: Yes: "it's like I didn't get it all out and later it moves". Has had 1-2 accidents in the past  Pads: No Fiber supplement: No - but takes magnesium  Feels like she needs to URINATION: Pain with urination: No Fully empty bladder: No :much better with medication but still has a couple instances of not feeling empty Stream: Strong Urgency: Yes:   Frequency: not quicker than every two hours, 1x at night Leakage: Coughing Pads: Yes: with a lot of  travel  INTERCOURSE: Pain with intercourse:  not painful  Ability to have vaginal penetration:  Yes:   Climax: strong contraction felt and uncomfortable - usually a large loss of urine, notices less leakage with urinating before  Marinoff Scale: 0/3  PREGNANCY: Vaginal deliveries 0 Tearing No C-section deliveries 0 Currently pregnant No  PROLAPSE: None   OBJECTIVE:   DIAGNOSTIC FINDINGS:    COGNITION: Overall cognitive status: Within functional limits for tasks assessed     SENSATION: Light touch: Appears intact Proprioception: Appears intact  MUSCLE LENGTH: Bil hips and adductors limited by 25%   POSTURE: rounded shoulders and posterior pelvic tilt   LUMBARAROM/PROM:  A/PROM A/PROM  eval  Flexion WFL  Extension WFL  Right lateral flexion Limited by 25%  Left lateral flexion Limited by 25%  Right rotation WFL  Left rotation WFL   (Blank rows = not tested)  LOWER EXTREMITY ROM:  WFL  LOWER EXTREMITY MMT:  Bil hips grossly 4/5 with exception of Lt hip abduction 3/5, knees 5/5 PALPATION:   General  no TTP reported by pt during assessment                 External Perineal Exam WFL, mild dryness noted, decreased estrogen                              Internal Pelvic Floor no TTP   Patient confirms identification and approves PT to assess internal pelvic floor and treatment Yes  PELVIC MMT:   MMT eval  Vaginal 2/5 Lt and 3/5 Rt side of pelvic floor, 4s, 6 reps  Internal Anal Sphincter 2/5LT varied between 2/5-3/5 in all other quadrants  External Anal Sphincter 2/5LT varied between 2/5-3/5 in all other quadrants  Puborectalis 2/5LT varied between 2/5-3/5 in all other quadrants  Diastasis Recti   (Blank rows = not tested)        TONE: Decreased on Lt side more so than Rt  PROLAPSE: Not seen in hooklying   TODAY'S TREATMENT:  DATE:    03/16/2023: NMRE: all exercises cued for coordination of pelvic floor and breathing with activity to decreased leakage and improved pelvic floor strength.  Attempted modified single stand however unable on Lt side, able to do 5 reps X10 Sit to stand from lowest setting with cues for glutes X15  bridges X10 palloffs blue band    03/23/23: Therapeutic exercise: focus on exercises to strengthen hips and core for improved pelvic floor strength NuStep 5 mins L5 - PT present to discuss POC and progress 2x10 Sit to stand body weight with pink pad to assist in stands Lateral step ups with 4" step 2x10 intermittent need of bar for support 4" step up Lt leading leg 2x10 Lateral High step over cone 2x10 each direction Standing 3# 3 way hip 2x10 each leg  PATIENT EDUCATION:  Education details: [id number] Person educated: Patient Education method: Explanation, Demonstration, Tactile cues, Verbal cues, and Handouts Education comprehension: verbalized understanding and returned demonstration  HOME EXERCISE PROGRAM: [email or web address]  ASSESSMENT:  CLINICAL IMPRESSION: Patient presents for treatment today, session focused on hip and core strengthening with increased challenge of standing and weighed resistance. Pt tolerated well but needed cues for technique as needed. Pt would benefit from additional PT to further address deficits.       OBJECTIVE IMPAIRMENTS: decreased coordination, decreased endurance, decreased strength, increased fascial restrictions, increased muscle spasms, impaired flexibility, improper body mechanics, postural dysfunction, and pain.   ACTIVITY LIMITATIONS: continence and laying  PARTICIPATION LIMITATIONS: community activity  PERSONAL FACTORS: 1 comorbidity: medical history   are also affecting patient's functional outcome.   REHAB POTENTIAL: Good  CLINICAL DECISION MAKING: Evolving/moderate complexity  EVALUATION COMPLEXITY: Moderate   GOALS: Goals reviewed  with patient? Yes  SHORT TERM GOALS: Target date: 02/24/23  Pt to be I with advanced HEP.  Baseline Goal status: INITIAL  2.  Pt will report no more than 6/10 pain at highest due to improvements in posture, strength, and muscle length  Baseline:  Goal status: INITIAL  3.  Pt will report her BMs are complete due to improved bowel habits and evacuation techniques.  Baseline:  Goal status: INITIAL    LONG TERM GOALS: Target date: 04/28/23  Pt to be I with advanced HEP.  Baseline:  Goal status: INITIAL  2.  Pt to demonstrate at least 5/5 bil hip strength for improved pelvic stability and functional squats without leakage.  Baseline:  Goal status: INITIAL  3.  Pt to demonstrate at least 4/5 pelvic floor strength for improved pelvic stability and decreased strain at pelvic floor/ decrease leakage.  Baseline:  Goal status: INITIAL  4.  Pt will report no more than 4/10 pain at highest due to improvements in posture, strength, and muscle length  Baseline:  Goal status: INITIAL   PLAN:  PT FREQUENCY: 1x/week  PT DURATION: other: 1x per week for 4 visits then every other week for 3   PLANNED INTERVENTIONS: Therapeutic exercises, Therapeutic activity, Neuromuscular re-education, Balance training, Gait training, Patient/Family education, Self Care, Aquatic Therapy, Dry Needling, Electrical stimulation, Spinal mobilization, Cryotherapy, Moist heat, scar mobilization, Taping, Biofeedback, and Manual therapy  PLAN FOR NEXT SESSION: manual at pelvic floor for improved strength and coordination, coordination of breathing and pelvic floor and strengthening exercises, voiding mechanics  Otelia Sergeant, PT, DPT 06/11/241:13 PM

## 2023-03-24 ENCOUNTER — Ambulatory Visit (HOSPITAL_COMMUNITY)
Admission: RE | Admit: 2023-03-24 | Discharge: 2023-03-24 | Disposition: A | Discharge status: Home / Self Care | Payer: PPO | Attending: Internal Medicine | Admitting: Internal Medicine

## 2023-03-24 ENCOUNTER — Encounter (HOSPITAL_COMMUNITY)
Admission: RE | Disposition: A | Discharge status: Home / Self Care | Payer: Self-pay | Source: Home / Self Care | Attending: Internal Medicine

## 2023-03-24 DIAGNOSIS — R053 Chronic cough: Secondary | ICD-10-CM | POA: Diagnosis not present

## 2023-03-24 DIAGNOSIS — K449 Diaphragmatic hernia without obstruction or gangrene: Secondary | ICD-10-CM

## 2023-03-24 DIAGNOSIS — R059 Cough, unspecified: Secondary | ICD-10-CM

## 2023-03-24 DIAGNOSIS — R12 Heartburn: Secondary | ICD-10-CM

## 2023-03-24 DIAGNOSIS — K219 Gastro-esophageal reflux disease without esophagitis: Secondary | ICD-10-CM

## 2023-03-24 HISTORY — PX: 24 HOUR PH STUDY: SHX5419

## 2023-03-24 HISTORY — PX: ESOPHAGEAL MANOMETRY: SHX5429

## 2023-03-24 SURGERY — MANOMETRY, ESOPHAGUS

## 2023-03-24 MED ORDER — LIDOCAINE VISCOUS HCL 2 % MT SOLN
OROMUCOSAL | Status: AC
  Filled 2023-03-24: qty 15

## 2023-03-24 SURGICAL SUPPLY — 2 items
FACESHIELD LNG OPTICON STERILE (SAFETY) IMPLANT
GLOVE BIO SURGEON STRL SZ8 (GLOVE) ×2 IMPLANT

## 2023-03-24 NOTE — Progress Notes (Signed)
Esophageal Manometry done per protocol. Pt tolerated well with out complication. Ph with impedance done per protocol. Pt tolerated well. Instructions given regarding the study and monitor. Pt verbalized understand and return demonstrated use of monitor. Pt will return tomorrow to have probe removed and monitor downloaded.  

## 2023-03-28 ENCOUNTER — Encounter (HOSPITAL_COMMUNITY): Payer: Self-pay | Admitting: Internal Medicine

## 2023-03-29 DIAGNOSIS — M25551 Pain in right hip: Secondary | ICD-10-CM | POA: Diagnosis not present

## 2023-03-29 DIAGNOSIS — G9589 Other specified diseases of spinal cord: Secondary | ICD-10-CM | POA: Diagnosis not present

## 2023-03-29 DIAGNOSIS — M25552 Pain in left hip: Secondary | ICD-10-CM | POA: Diagnosis not present

## 2023-03-29 DIAGNOSIS — M545 Low back pain, unspecified: Secondary | ICD-10-CM | POA: Diagnosis not present

## 2023-04-01 ENCOUNTER — Encounter: Payer: Self-pay | Admitting: Physical Therapy

## 2023-04-03 DIAGNOSIS — K219 Gastro-esophageal reflux disease without esophagitis: Secondary | ICD-10-CM

## 2023-04-05 ENCOUNTER — Ambulatory Visit: Payer: PPO | Admitting: Family Medicine

## 2023-04-05 ENCOUNTER — Telehealth: Payer: Self-pay | Admitting: Internal Medicine

## 2023-04-05 NOTE — Telephone Encounter (Signed)
PT is calling to discuss the EM results. Please advise.

## 2023-04-05 NOTE — Telephone Encounter (Signed)
Please explain there is no "uncontrolled" GERD as the study was done on PPI therapy The clumps of mucus are not felt to be from GERD, which unfortunately is all I can say (because I do not know what is causing her symptoms) I am happy to meet with her again by video or in person visit to discuss further if she wishes

## 2023-04-05 NOTE — Telephone Encounter (Signed)
Patient was called to inform of Dr. Lauro Franklin findings. Patient was informed there is no GERD, and her esophagus function is normal. Patient was also informed to discuss with her PCP. Patient states she has already seen the [facility], an ENT and Allergist, with no answer or explanation as to the clumps of mucus she continues to expel. Patient states she is glad to know its not GERD, but now is concerned not knowing what is causing the problem. Patient is frustrated and welcomes any information that could possibly help. Please advise.

## 2023-04-05 NOTE — Telephone Encounter (Signed)
Esophageal manometry with 24-hour pH and impedance testing done to evaluate chronic cough GERD excluded by this study and her esophagus function is normal  Her cough is not related to uncontrolled reflux Her stomach acid is adequately suppressed and reflux is under good control  We now know that the cough is not reflux driven Please communicate these results to her and she can discuss this further with her primary care provider.  Would consider pulmonology referral if not already done for chronic cough now know not to be GERD

## 2023-04-06 DIAGNOSIS — M545 Low back pain, unspecified: Secondary | ICD-10-CM | POA: Diagnosis not present

## 2023-04-06 NOTE — Telephone Encounter (Signed)
Patient called to inform the clumps of mucus is not associated with GERD, per Dr. Rhea Belton, aslo states would be happy to see patient and discuss further. Patient states would like to discuss and agreed to an appt on 08/04/2023 at 8:30am.

## 2023-04-08 ENCOUNTER — Ambulatory Visit: Payer: PPO | Admitting: Physical Therapy

## 2023-04-08 DIAGNOSIS — R293 Abnormal posture: Secondary | ICD-10-CM

## 2023-04-08 DIAGNOSIS — M6281 Muscle weakness (generalized): Secondary | ICD-10-CM | POA: Diagnosis not present

## 2023-04-08 DIAGNOSIS — R279 Unspecified lack of coordination: Secondary | ICD-10-CM

## 2023-04-08 NOTE — Therapy (Signed)
OUTPATIENT PHYSICAL THERAPY FEMALE PELVIC TREATMENT   Patient Name: Regina [name] MRN: [id number] DOB:[birth year], 82 y.o., female Today's Date: 04/08/2023  END OF SESSION:  PT End of Session - 04/08/23 1105     Visit Number 6    Date for PT Re-Evaluation 04/28/23    Authorization Type [employer]    PT Start Time 1100    PT Stop Time 1140    PT Time Calculation (min) 40 min    Activity Tolerance Patient tolerated treatment well    Behavior During Therapy WFL for tasks assessed/performed              Past Medical History:  Diagnosis Date   Diverticulosis    GERD (gastroesophageal reflux disease)    Headache    Heterozygous factor V Leiden mutation (HCC)    Hiatal hernia    Insomnia    Nervous system disorder    spinal cord tethered   Neurogenic bladder    Osteoporosis    Osteoporosis    Tubular adenoma of colon    Urinary incontinence    Past Surgical History:  Procedure Laterality Date   81 HOUR PH STUDY N/A 03/24/2023   Procedure: 24 HOUR PH STUDY;  Surgeon: Beverley Fiedler, MD;  Location: [facility];  Service: Gastroenterology;  Laterality: N/A;   dental implants     ESOPHAGEAL MANOMETRY N/A 03/24/2023   Procedure: ESOPHAGEAL MANOMETRY (EM);  Surgeon: Beverley Fiedler, MD;  Location: [facility];  Service: Gastroenterology;  Laterality: N/A;   PARTIAL HYSTERECTOMY  10/12/1994   SALPINGOOPHORECTOMY Right    TUBAL LIGATION     Patient Active Problem List   Diagnosis Date Noted   Gastroesophageal reflux disease 04/03/2023   Left buttock pain 11/14/2020   Myofascial pain 11/14/2020   Piriformis syndrome of left side 05/07/2020   Family history of glaucoma 03/29/2019   Meibomian gland dysfunction (MGD) of both eyes 03/29/2019   Vitreous syneresis of both eyes 03/29/2019   Open angle with borderline findings and low glaucoma risk in both eyes 03/02/2018   Dysphonia 01/03/2018   Post-nasal drainage 01/03/2018   Heterozygous factor V Leiden  mutation (HCC) 05/28/2016   Amaurosis fugax of left eye 05/28/2016   Migraine aura without headache 04/13/2016   Dermatochalasis of both upper eyelids 08/01/2015   Asthma, mild intermittent 06/05/2014   Cough 03/20/2014   Lipoma 02/22/2013    PCP: [name], MD  REFERRING PROVIDER: Beverley Fiedler, MD    REFERRING DIAG: M62.89 (ICD-10-CM) - Pelvic floor dysfunction  THERAPY DIAG:  Muscle weakness (generalized)  Unspecified lack of coordination  Abnormal posture  Rationale for Evaluation and Treatment: Rehabilitation  ONSET DATE: years ago  SUBJECTIVE:  SUBJECTIVE STATEMENT: Pt reports "I'm exhausted I have a lot on my schedule". Missed the "Shock therapy" treatment "I forgot about it". I got an MRI for low back imaging to see if this is causing hip pain but haven't gotten results back.  Very minor leakage of stool only with not getting there in time now. And not happening often, doesn't always go with urge and tries to hold it until she finishes task.  Is having cramps and numbness in toes with lying down at night  PAIN:  High pain levels had to take pain pill, since pain is 0.5/10 now.    PRECAUTIONS: None  WEIGHT BEARING RESTRICTIONS: No  FALLS:  Has patient fallen in last 6 months? No  LIVING ENVIRONMENT: Lives with: lives with their family Lives in: House/apartment   OCCUPATION: self employed   PLOF: Independent  PATIENT GOALS: to have to less leakage and improved bowel habits  PERTINENT HISTORY:  GERD, adenomatous colon polyps, chronic constipation, colonic diverticulosis, neurogenic bladder, tethered spinal cord, chronic productive cough, Osteoporosis   BOWEL MOVEMENT: Pain with bowel movement: No Type of bowel movement:Type (Bristol Stool Scale) 3-4, Frequency  daily, and Strain Yes Fully empty rectum: No Leakage: Yes: "it's like I didn't get it all out and later it moves". Has had 1-2 accidents in the past  Pads: No Fiber supplement: No - but takes magnesium  Feels like she needs to URINATION: Pain with urination: No Fully empty bladder: No :much better with medication but still has a couple instances of not feeling empty Stream: Strong Urgency: Yes:   Frequency: not quicker than every two hours, 1x at night Leakage: Coughing Pads: Yes: with a lot of travel  INTERCOURSE: Pain with intercourse:  not painful  Ability to have vaginal penetration:  Yes:   Climax: strong contraction felt and uncomfortable - usually a large loss of urine, notices less leakage with urinating before  Marinoff Scale: 0/3  PREGNANCY: Vaginal deliveries 0 Tearing No C-section deliveries 0 Currently pregnant No  PROLAPSE: None   OBJECTIVE:   DIAGNOSTIC FINDINGS:    COGNITION: Overall cognitive status: Within functional limits for tasks assessed     SENSATION: Light touch: Appears intact Proprioception: Appears intact  MUSCLE LENGTH: Bil hips and adductors limited by 25%   POSTURE: rounded shoulders and posterior pelvic tilt   LUMBARAROM/PROM:  A/PROM A/PROM  eval  Flexion WFL  Extension WFL  Right lateral flexion Limited by 25%  Left lateral flexion Limited by 25%  Right rotation WFL  Left rotation WFL   (Blank rows = not tested)  LOWER EXTREMITY ROM:  WFL  LOWER EXTREMITY MMT:  Bil hips grossly 4/5 with exception of Lt hip abduction 3/5, knees 5/5 PALPATION:   General  no TTP reported by pt during assessment                 External Perineal Exam WFL, mild dryness noted, decreased estrogen                              Internal Pelvic Floor no TTP   Patient confirms identification and approves PT to assess internal pelvic floor and treatment Yes  PELVIC MMT:   MMT eval  Vaginal 2/5 Lt and 3/5 Rt side of pelvic floor, 4s,  6 reps  Internal Anal Sphincter 2/5LT varied between 2/5-3/5 in all other quadrants  External Anal Sphincter 2/5LT varied between 2/5-3/5 in all other quadrants  Puborectalis 2/5LT varied between 2/5-3/5 in all other quadrants  Diastasis Recti   (Blank rows = not tested)        TONE: Decreased on Lt side more so than Rt  PROLAPSE: Not seen in hooklying   TODAY'S TREATMENT:                                                                                                                              DATE:  04/08/23:  Therapeutic exercise: focus on exercises to strengthen hips and core for improved pelvic floor strength NuStep 5 mins L3 - PT present to discuss POC and progress 2x10 Sit to stand body weight with pink pad to assist in stands; last 5 reps with slow eccentric focus in return to sitting Lateral step ups with 6" step x10 4# ankle weight 6" step up Lt leading leg 2x10 Standing 4# 3 way hip 2x10 each leg  PATIENT EDUCATION:  Education details: [id number] Person educated: Patient Education method: Explanation, Demonstration, Tactile cues, Verbal cues, and Handouts Education comprehension: verbalized understanding and returned demonstration  HOME EXERCISE PROGRAM: [email or web address]  ASSESSMENT:  CLINICAL IMPRESSION: Patient presents for treatment today, session focused on hip and core strengthening with increased challenge of standing and weighed resistance. Pt tolerated well but needed cues for technique as needed. Pt reports improved leakage, with less frequency and decreased amount. Pt would benefit from additional PT to further address deficits.       OBJECTIVE IMPAIRMENTS: decreased coordination, decreased endurance, decreased strength, increased fascial restrictions, increased muscle spasms, impaired flexibility, improper body mechanics, postural dysfunction, and pain.   ACTIVITY LIMITATIONS: continence and laying  PARTICIPATION LIMITATIONS: community activity  PERSONAL  FACTORS: 1 comorbidity: medical history   are also affecting patient's functional outcome.   REHAB POTENTIAL: Good  CLINICAL DECISION MAKING: Evolving/moderate complexity  EVALUATION COMPLEXITY: Moderate   GOALS: Goals reviewed with patient? Yes  SHORT TERM GOALS: Target date: 02/24/23  Pt to be I with advanced HEP.  Baseline Goal status: MET  2.  Pt will report no more than 6/10 pain at highest due to improvements in posture, strength, and muscle length  Baseline:  Goal status: MET  3.  Pt will report her BMs are complete due to improved bowel habits and evacuation techniques.  Baseline:  Goal status: MET    LONG TERM GOALS: Target date: 04/28/23  Pt to be I with advanced HEP.  Baseline:  Goal status: MET  2.  Pt to demonstrate at least 5/5 bil hip strength for improved pelvic stability and functional squats without leakage.  Baseline:  Goal status: not met  3.  Pt to demonstrate at least 4/5 pelvic floor strength for improved pelvic stability and decreased strain at pelvic floor/ decrease leakage.  Baseline:  Goal status: not met  4.  Pt will report no more than 4/10 pain at highest due to improvements in posture, strength, and muscle length  Baseline:  Goal status: not met  PLAN:  PT FREQUENCY: 1x/week  PT DURATION: other: 1x per week for 4 visits then every other week for 3   PLANNED INTERVENTIONS: Therapeutic exercises, Therapeutic activity, Neuromuscular re-education, Balance training, Gait training, Patient/Family education, Self Care, Aquatic Therapy, Dry Needling, Electrical stimulation, Spinal mobilization, Cryotherapy, Moist heat, scar mobilization, Taping, Biofeedback, and Manual therapy  PLAN FOR NEXT SESSION: manual at pelvic floor for improved strength and coordination, coordination of breathing and pelvic floor and strengthening exercises, voiding mechanics  Otelia Sergeant, PT, DPT 04/07/2410:46 AM

## 2023-04-13 DIAGNOSIS — R2241 Localized swelling, mass and lump, right lower limb: Secondary | ICD-10-CM | POA: Diagnosis not present

## 2023-04-13 DIAGNOSIS — M810 Age-related osteoporosis without current pathological fracture: Secondary | ICD-10-CM | POA: Diagnosis not present

## 2023-04-14 ENCOUNTER — Other Ambulatory Visit: Payer: Self-pay | Admitting: Pain Medicine

## 2023-04-14 DIAGNOSIS — H35363 Drusen (degenerative) of macula, bilateral: Secondary | ICD-10-CM | POA: Diagnosis not present

## 2023-04-14 DIAGNOSIS — M545 Low back pain, unspecified: Secondary | ICD-10-CM | POA: Diagnosis not present

## 2023-04-14 DIAGNOSIS — H5203 Hypermetropia, bilateral: Secondary | ICD-10-CM | POA: Diagnosis not present

## 2023-04-14 DIAGNOSIS — H04123 Dry eye syndrome of bilateral lacrimal glands: Secondary | ICD-10-CM | POA: Diagnosis not present

## 2023-04-14 DIAGNOSIS — H26491 Other secondary cataract, right eye: Secondary | ICD-10-CM | POA: Diagnosis not present

## 2023-04-14 DIAGNOSIS — H524 Presbyopia: Secondary | ICD-10-CM | POA: Diagnosis not present

## 2023-04-14 DIAGNOSIS — H52203 Unspecified astigmatism, bilateral: Secondary | ICD-10-CM | POA: Diagnosis not present

## 2023-04-14 DIAGNOSIS — Z83511 Family history of glaucoma: Secondary | ICD-10-CM | POA: Diagnosis not present

## 2023-04-14 DIAGNOSIS — Z961 Presence of intraocular lens: Secondary | ICD-10-CM | POA: Diagnosis not present

## 2023-04-14 DIAGNOSIS — R229 Localized swelling, mass and lump, unspecified: Secondary | ICD-10-CM

## 2023-04-14 DIAGNOSIS — H43813 Vitreous degeneration, bilateral: Secondary | ICD-10-CM | POA: Diagnosis not present

## 2023-04-14 DIAGNOSIS — H40003 Preglaucoma, unspecified, bilateral: Secondary | ICD-10-CM | POA: Diagnosis not present

## 2023-04-14 DIAGNOSIS — H353131 Nonexudative age-related macular degeneration, bilateral, early dry stage: Secondary | ICD-10-CM | POA: Diagnosis not present

## 2023-04-19 ENCOUNTER — Ambulatory Visit
Admission: RE | Admit: 2023-04-19 | Discharge: 2023-04-19 | Disposition: A | Discharge status: Home / Self Care | Payer: PPO | Source: Ambulatory Visit | Attending: Pain Medicine | Admitting: Pain Medicine

## 2023-04-19 DIAGNOSIS — M6281 Muscle weakness (generalized): Secondary | ICD-10-CM | POA: Diagnosis not present

## 2023-04-19 DIAGNOSIS — R229 Localized swelling, mass and lump, unspecified: Secondary | ICD-10-CM

## 2023-04-19 DIAGNOSIS — R2241 Localized swelling, mass and lump, right lower limb: Secondary | ICD-10-CM | POA: Diagnosis not present

## 2023-04-19 DIAGNOSIS — M5416 Radiculopathy, lumbar region: Secondary | ICD-10-CM | POA: Diagnosis not present

## 2023-04-22 ENCOUNTER — Ambulatory Visit: Payer: PPO | Attending: Internal Medicine | Admitting: Physical Therapy

## 2023-04-22 ENCOUNTER — Ambulatory Visit: Payer: PPO | Admitting: Family Medicine

## 2023-04-22 VITALS — BP 112/72 | Ht 63.25 in | Wt 122.0 lb

## 2023-04-22 DIAGNOSIS — R293 Abnormal posture: Secondary | ICD-10-CM | POA: Insufficient documentation

## 2023-04-22 DIAGNOSIS — M25552 Pain in left hip: Secondary | ICD-10-CM | POA: Diagnosis not present

## 2023-04-22 DIAGNOSIS — R279 Unspecified lack of coordination: Secondary | ICD-10-CM | POA: Insufficient documentation

## 2023-04-22 DIAGNOSIS — M6281 Muscle weakness (generalized): Secondary | ICD-10-CM | POA: Diagnosis not present

## 2023-04-22 NOTE — Therapy (Signed)
OUTPATIENT PHYSICAL THERAPY FEMALE PELVIC TREATMENT   Patient Name: Regina [name] MRN: [id number] DOB:July 26, 1941, 82 y.o., female Today's Date: 04/22/2023  END OF SESSION:  PT End of Session - 04/22/23 0939     Visit Number 7    Date for PT Re-Evaluation 04/28/23    Authorization Type [employer]    PT Start Time 0932    PT Stop Time 1011    PT Time Calculation (min) 39 min    Activity Tolerance Patient tolerated treatment well    Behavior During Therapy WFL for tasks assessed/performed              Past Medical History:  Diagnosis Date   Diverticulosis    GERD (gastroesophageal reflux disease)    Headache    Heterozygous factor V Leiden mutation (HCC)    Hiatal hernia    Insomnia    Nervous system disorder    spinal cord tethered   Neurogenic bladder    Osteoporosis    Osteoporosis    Tubular adenoma of colon    Urinary incontinence    Past Surgical History:  Procedure Laterality Date   63 HOUR PH STUDY N/A 03/24/2023   Procedure: 24 HOUR PH STUDY;  Surgeon: Beverley Fiedler, MD;  Location: [facility];  Service: Gastroenterology;  Laterality: N/A;   dental implants     ESOPHAGEAL MANOMETRY N/A 03/24/2023   Procedure: ESOPHAGEAL MANOMETRY (EM);  Surgeon: Beverley Fiedler, MD;  Location: [facility];  Service: Gastroenterology;  Laterality: N/A;   PARTIAL HYSTERECTOMY  10/12/1994   SALPINGOOPHORECTOMY Right    TUBAL LIGATION     Patient Active Problem List   Diagnosis Date Noted   Gastroesophageal reflux disease 04/03/2023   Left buttock pain 11/14/2020   Myofascial pain 11/14/2020   Piriformis syndrome of left side 05/07/2020   Family history of glaucoma 03/29/2019   Meibomian gland dysfunction (MGD) of both eyes 03/29/2019   Vitreous syneresis of both eyes 03/29/2019   Open angle with borderline findings and low glaucoma risk in both eyes 03/02/2018   Dysphonia 01/03/2018   Post-nasal drainage 01/03/2018   Heterozygous factor V Leiden  mutation (HCC) 05/28/2016   Amaurosis fugax of left eye 05/28/2016   Migraine aura without headache 04/13/2016   Dermatochalasis of both upper eyelids 08/01/2015   Asthma, mild intermittent 06/05/2014   Cough 03/20/2014   Lipoma 02/22/2013    PCP: [name], MD  REFERRING PROVIDER: Beverley Fiedler, MD    REFERRING DIAG: M62.89 (ICD-10-CM) - Pelvic floor dysfunction  THERAPY DIAG:  Muscle weakness (generalized)  Unspecified lack of coordination  Abnormal posture  Rationale for Evaluation and Treatment: Rehabilitation  ONSET DATE: years ago  SUBJECTIVE:  SUBJECTIVE STATEMENT: Pt reports she has had a lot going on with work, travel, she had to call 911 for husband but he is doing better now. For bowels reports she actually had a little constipation and increased magnesium and this helped without having leakage of stool. Overall leakage of stool has been a lot better and feels like having much fuller bowel movements and only sometimes needs to go back to empty more.   PAIN:  Achy Lt hip 1/10 very mildly.    PRECAUTIONS: None  WEIGHT BEARING RESTRICTIONS: No  FALLS:  Has patient fallen in last 6 months? No  LIVING ENVIRONMENT: Lives with: lives with their family Lives in: House/apartment   OCCUPATION: self employed   PLOF: Independent  PATIENT GOALS: to have to less leakage and improved bowel habits  PERTINENT HISTORY:  GERD, adenomatous colon polyps, chronic constipation, colonic diverticulosis, neurogenic bladder, tethered spinal cord, chronic productive cough, Osteoporosis   BOWEL MOVEMENT: Pain with bowel movement: No Type of bowel movement:Type (Bristol Stool Scale) 3-4, Frequency daily, and Strain Yes Fully empty rectum: No Leakage: Yes: "it's like I didn't get it all  out and later it moves". Has had 1-2 accidents in the past  Pads: No Fiber supplement: No - but takes magnesium  Feels like she needs to URINATION: Pain with urination: No Fully empty bladder: No :much better with medication but still has a couple instances of not feeling empty Stream: Strong Urgency: Yes:   Frequency: not quicker than every two hours, 1x at night Leakage: Coughing Pads: Yes: with a lot of travel  INTERCOURSE: Pain with intercourse:  not painful  Ability to have vaginal penetration:  Yes:   Climax: strong contraction felt and uncomfortable - usually a large loss of urine, notices less leakage with urinating before  Marinoff Scale: 0/3  PREGNANCY: Vaginal deliveries 0 Tearing No C-section deliveries 0 Currently pregnant No  PROLAPSE: None   OBJECTIVE:   DIAGNOSTIC FINDINGS:    COGNITION: Overall cognitive status: Within functional limits for tasks assessed     SENSATION: Light touch: Appears intact Proprioception: Appears intact  MUSCLE LENGTH: Bil hips and adductors limited by 25%   POSTURE: rounded shoulders and posterior pelvic tilt   LUMBARAROM/PROM:  A/PROM A/PROM  eval  Flexion WFL  Extension WFL  Right lateral flexion Limited by 25%  Left lateral flexion Limited by 25%  Right rotation WFL  Left rotation WFL   (Blank rows = not tested)  LOWER EXTREMITY ROM:  WFL  LOWER EXTREMITY MMT:  Bil hips grossly 4/5 with exception of Lt hip abduction 3/5, knees 5/5 PALPATION:   General  no TTP reported by pt during assessment                 External Perineal Exam WFL, mild dryness noted, decreased estrogen                              Internal Pelvic Floor no TTP   Patient confirms identification and approves PT to assess internal pelvic floor and treatment Yes  PELVIC MMT:   MMT eval  Vaginal 2/5 Lt and 3/5 Rt side of pelvic floor, 4s, 6 reps  Internal Anal Sphincter 2/5LT varied between 2/5-3/5 in all other quadrants   External Anal Sphincter 2/5LT varied between 2/5-3/5 in all other quadrants  Puborectalis 2/5LT varied between 2/5-3/5 in all other quadrants  Diastasis Recti   (Blank rows = not tested)  TONE: Decreased on Lt side more so than Rt  PROLAPSE: Not seen in hooklying   TODAY'S TREATMENT:                                                                                                                              DATE:  04/22/23   Therapeutic exercise: focus on exercises to strengthen hips and core for improved pelvic floor strength 2x10 Sit to stand body weight with pad to assist in stands - benefited from Lt glute med tapping to improve activation X10 Sit to stand with x2 marches each stand - improved with no need of external assist Lateral steps with red loop 3x 20' each way - second and third rep no loop kegels with hip abduction      PATIENT EDUCATION:  Education details: [id number] Person educated: Patient Education method: Explanation, Demonstration, Tactile cues, Verbal cues, and Handouts Education comprehension: verbalized understanding and returned demonstration  HOME EXERCISE PROGRAM: [id number]  ASSESSMENT:  CLINICAL IMPRESSION: Patient presents for treatment today, session focused on hip and core strengthening with increased challenge of standing and weighed resistance. Pt tolerated well but needed cues for technique as needed. Pt reports improved leakage, with less frequency and decreased amount overall pleased with progress. Denied internal reassessment today. Pt would benefit from additional PT to further address deficits.       OBJECTIVE IMPAIRMENTS: decreased coordination, decreased endurance, decreased strength, increased fascial restrictions, increased muscle spasms, impaired flexibility, improper body mechanics, postural dysfunction, and pain.   ACTIVITY LIMITATIONS: continence and laying  PARTICIPATION LIMITATIONS: community activity  PERSONAL FACTORS: 1  comorbidity: medical history   are also affecting patient's functional outcome.   REHAB POTENTIAL: Good  CLINICAL DECISION MAKING: Evolving/moderate complexity  EVALUATION COMPLEXITY: Moderate   GOALS: Goals reviewed with patient? Yes  SHORT TERM GOALS: Target date: 02/24/23  Pt to be I with advanced HEP.  Baseline Goal status: MET  2.  Pt will report no more than 6/10 pain at highest due to improvements in posture, strength, and muscle length  Baseline:  Goal status: MET  3.  Pt will report her BMs are complete due to improved bowel habits and evacuation techniques.  Baseline:  Goal status: MET    LONG TERM GOALS: Target date: 04/28/23  Pt to be I with advanced HEP.  Baseline:  Goal status: MET  2.  Pt to demonstrate at least 5/5 bil hip strength for improved pelvic stability and functional squats without leakage.  Baseline:  Goal status: not met  3.  Pt to demonstrate at least 4/5 pelvic floor strength for improved pelvic stability and decreased strain at pelvic floor/ decrease leakage.  Baseline:  Goal status: not met  4.  Pt will report no more than 4/10 pain at highest due to improvements in posture, strength, and muscle length  Baseline:  Goal status: not met   PLAN:  PT FREQUENCY: 1x/week  PT DURATION: other: 1x per week for  4 visits then every other week for 3   PLANNED INTERVENTIONS: Therapeutic exercises, Therapeutic activity, Neuromuscular re-education, Balance training, Gait training, Patient/Family education, Self Care, Aquatic Therapy, Dry Needling, Electrical stimulation, Spinal mobilization, Cryotherapy, Moist heat, scar mobilization, Taping, Biofeedback, and Manual therapy  PLAN FOR NEXT SESSION: manual at pelvic floor for improved strength and coordination, coordination of breathing and pelvic floor and strengthening exercises, voiding mechanics  Otelia Sergeant, PT, DPT 04/21/2409:18 AM

## 2023-04-22 NOTE — Progress Notes (Signed)
PCP: [name], MD  Subjective:   HPI: Patient is a 82 y.o. female here for left hip pain.  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  She has had corticosteroid injections as well as Botox injections with minimal improvement.  Reports that her pain has been going on for about 30 years.  Mostly in the lateral/posterior area of her hip, worst at night when she is laying down and trying to sleep.  She is usually able to walk, stand, and sit without much pain.  She has tried many things without much improvement.  She saw Dr. Lyn Hollingshead who recommended that she try shockwave therapy.  She has never received surgery in her hip.  She regularly attends physical therapy.  Denies recent fevers, traumatic injury  Past Medical History:  Diagnosis Date   Diverticulosis    GERD (gastroesophageal reflux disease)    Headache    Heterozygous factor V Leiden mutation (HCC)    Hiatal hernia    Insomnia    Nervous system disorder    spinal cord tethered   Neurogenic bladder    Osteoporosis    Osteoporosis    Tubular adenoma of colon    Urinary incontinence     Current Outpatient Medications on File Prior to Visit  Medication Sig Dispense Refill   ALPRAZOLAM PO Take by mouth as needed.     Ascorbic Acid (VITAMIN C) 1000 MG tablet Take 1,000 mg by mouth daily.      aspirin 81 MG EC tablet Take by mouth 2 (two) times a week.     atorvastatin (LIPITOR) 10 MG tablet Take 1 tablet (10 mg total) by mouth daily. 90 tablet 2   b complex vitamins capsule Take 1 capsule by mouth daily.     calcium citrate-vitamin D (CITRACAL+D) 315-200 MG-UNIT tablet Take 1 tablet by mouth every morning.     Cholecalciferol (VITAMIN D3) 2000 units TABS Take 2,000 Units by mouth daily.     CHROMIUM PO Take 200 mg by mouth daily.     famotidine (PEPCID) 20 MG tablet Take 1 tablet (20 mg total) by mouth at bedtime as needed for heartburn or indigestion. 90 tablet 1   Fexofenadine HCl  (MUCINEX ALLERGY PO) Take 1 tablet by mouth daily.     gabapentin (NEURONTIN) 300 MG capsule Take 300 mg by mouth daily in the afternoon.     Loratadine 10 MG CAPS Take 1 capsule by mouth daily at 6 (six) AM. [employer] brand     Lutein 6 MG CAPS Take 1 capsule by mouth with breakfast, with lunch, and with evening meal.     Lysine 1000 MG TABS Take 1 tablet by mouth daily.     Magnesium 500 MG CAPS Take 1 capsule by mouth daily.     mirabegron ER (MYRBETRIQ) 50 MG TB24 tablet Take 1 tablet by mouth daily.     montelukast (SINGULAIR) 10 MG tablet Take 1 tablet (10 mg total) by mouth at bedtime. 90 tablet 3   Multiple Vitamins-Minerals (WOMENS BONE HEALTH PO) Take 1 tablet by mouth daily.     nitroGLYCERIN (NITROSTAT) 0.4 MG SL tablet Place 1 tablet (0.4 mg total) under the tongue as needed. One tablet as needed for chest tightness 25 tablet 3   Omega-3 Fatty Acids (OMEGA 3 500 PO) Take 1 tablet by mouth daily at 6 (six) AM.     omeprazole (PRILOSEC) 40 MG capsule Take 40 mg by mouth 2 (  two) times daily.     Zinc Sulfate (ZINC-220 PO) Take 1 tablet by mouth daily at 6 (six) AM.     No current facility-administered medications on file prior to visit.    Past Surgical History:  Procedure Laterality Date   42 HOUR PH STUDY N/A 03/24/2023   Procedure: 24 HOUR PH STUDY;  Surgeon: Beverley Fiedler, MD;  Location: [facility];  Service: Gastroenterology;  Laterality: N/A;   dental implants     ESOPHAGEAL MANOMETRY N/A 03/24/2023   Procedure: ESOPHAGEAL MANOMETRY (EM);  Surgeon: Beverley Fiedler, MD;  Location: [facility];  Service: Gastroenterology;  Laterality: N/A;   PARTIAL HYSTERECTOMY  10/12/1994   SALPINGOOPHORECTOMY Right    TUBAL LIGATION      Allergies  Allergen Reactions   Other     Other reaction(s): Other (See Comments) Severe GI irritation. Severe GI irritation.    Ketoprofen     Stomach issues   Tramadol Itching   Oxycodone Itching   Venlafaxine Itching    BP 112/72   Ht 5'  3.25" (1.607 m)   Wt 122 lb (55.3 kg)   LMP 08/01/2014   BMI 21.44 kg/m       No data to display              No data to display              Objective:  Physical Exam:  Gen: NAD, comfortable in exam room  L hip: Inspection: No gross deformity, ecchymoses, swelling Palpation: Point tenderness over posterolateral hip/gluteal region.  Point tenderness over greater trochanter.  Otherwise nontender along iliac crest ROM: Full range of motion Strength: 5-/5 strength with hip abduction.  Otherwise 5/5 strength Special tests: Negative FABER, FADIR   Assessment & Plan:  1.  Left hip pain 2/2 likely gluteal muscle strain versus tendinopathy in the setting of tethered spinal cord and lipoma and lumbar spine with chronic atrophy/weakness of LLE.  Minimal improvement in her pain with interventions thus far including injections and physical therapy.  Discussed risk/benefits of shockwave therapy, patient elected to proceed.  Procedure: ECSWT Indications: Left hip pain   Procedure Details Consent: Risks of procedure as well as the alternatives and risks of each were explained to the patient.  Written consent for procedure obtained. Time Out: Verified patient identification, verified procedure, site was marked, verified correct patient position, medications/allergies/relevent history reviewed.  The area was cleaned with alcohol swab.     The left hip/gluteal region was targeted for Extracorporeal shockwave therapy.    Preset: myofascial trigger point therapy Power Level: 100 Frequency: 12 Impulse/cycles: 2500 Head size: large   Patient tolerated procedure well without immediate complications

## 2023-04-26 DIAGNOSIS — M6281 Muscle weakness (generalized): Secondary | ICD-10-CM | POA: Diagnosis not present

## 2023-04-26 DIAGNOSIS — M5416 Radiculopathy, lumbar region: Secondary | ICD-10-CM | POA: Diagnosis not present

## 2023-04-29 IMAGING — CR DG HIP (WITH OR WITHOUT PELVIS) 2-3V*R*
2 series · 2 of 2 positions shown · non-contrast
Comparison: None.

CLINICAL DATA: Right hip pain

EXAM:
DG HIP (WITH OR WITHOUT PELVIS) 2-3V RIGHT

[w hip ap right]
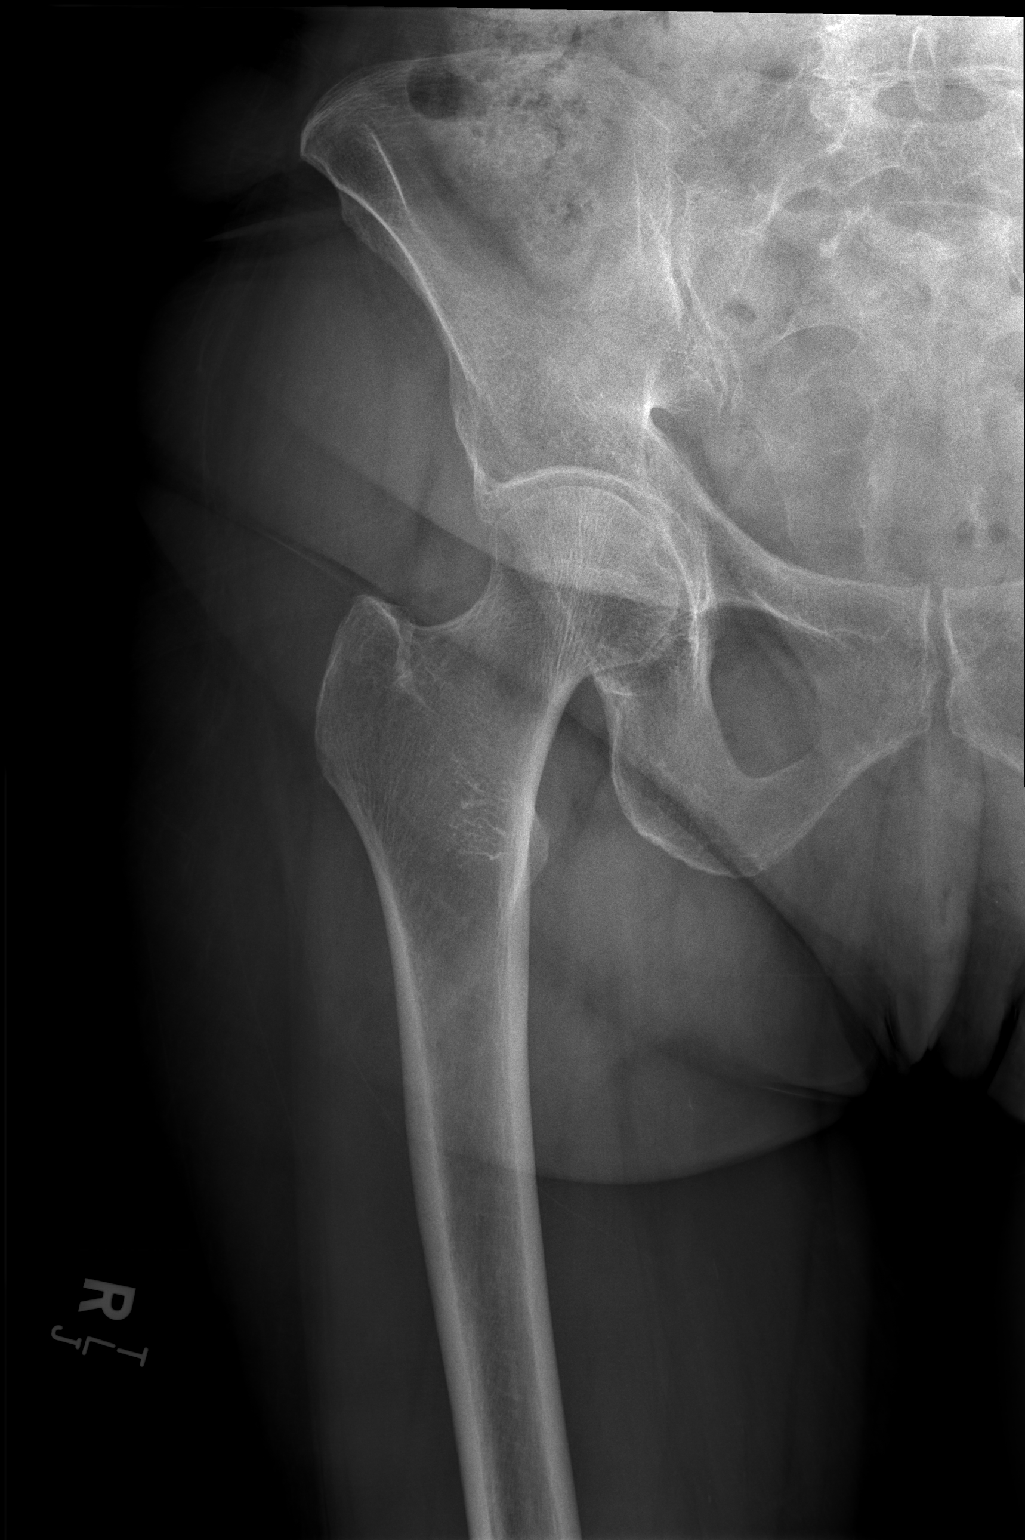

[w hip frog right]
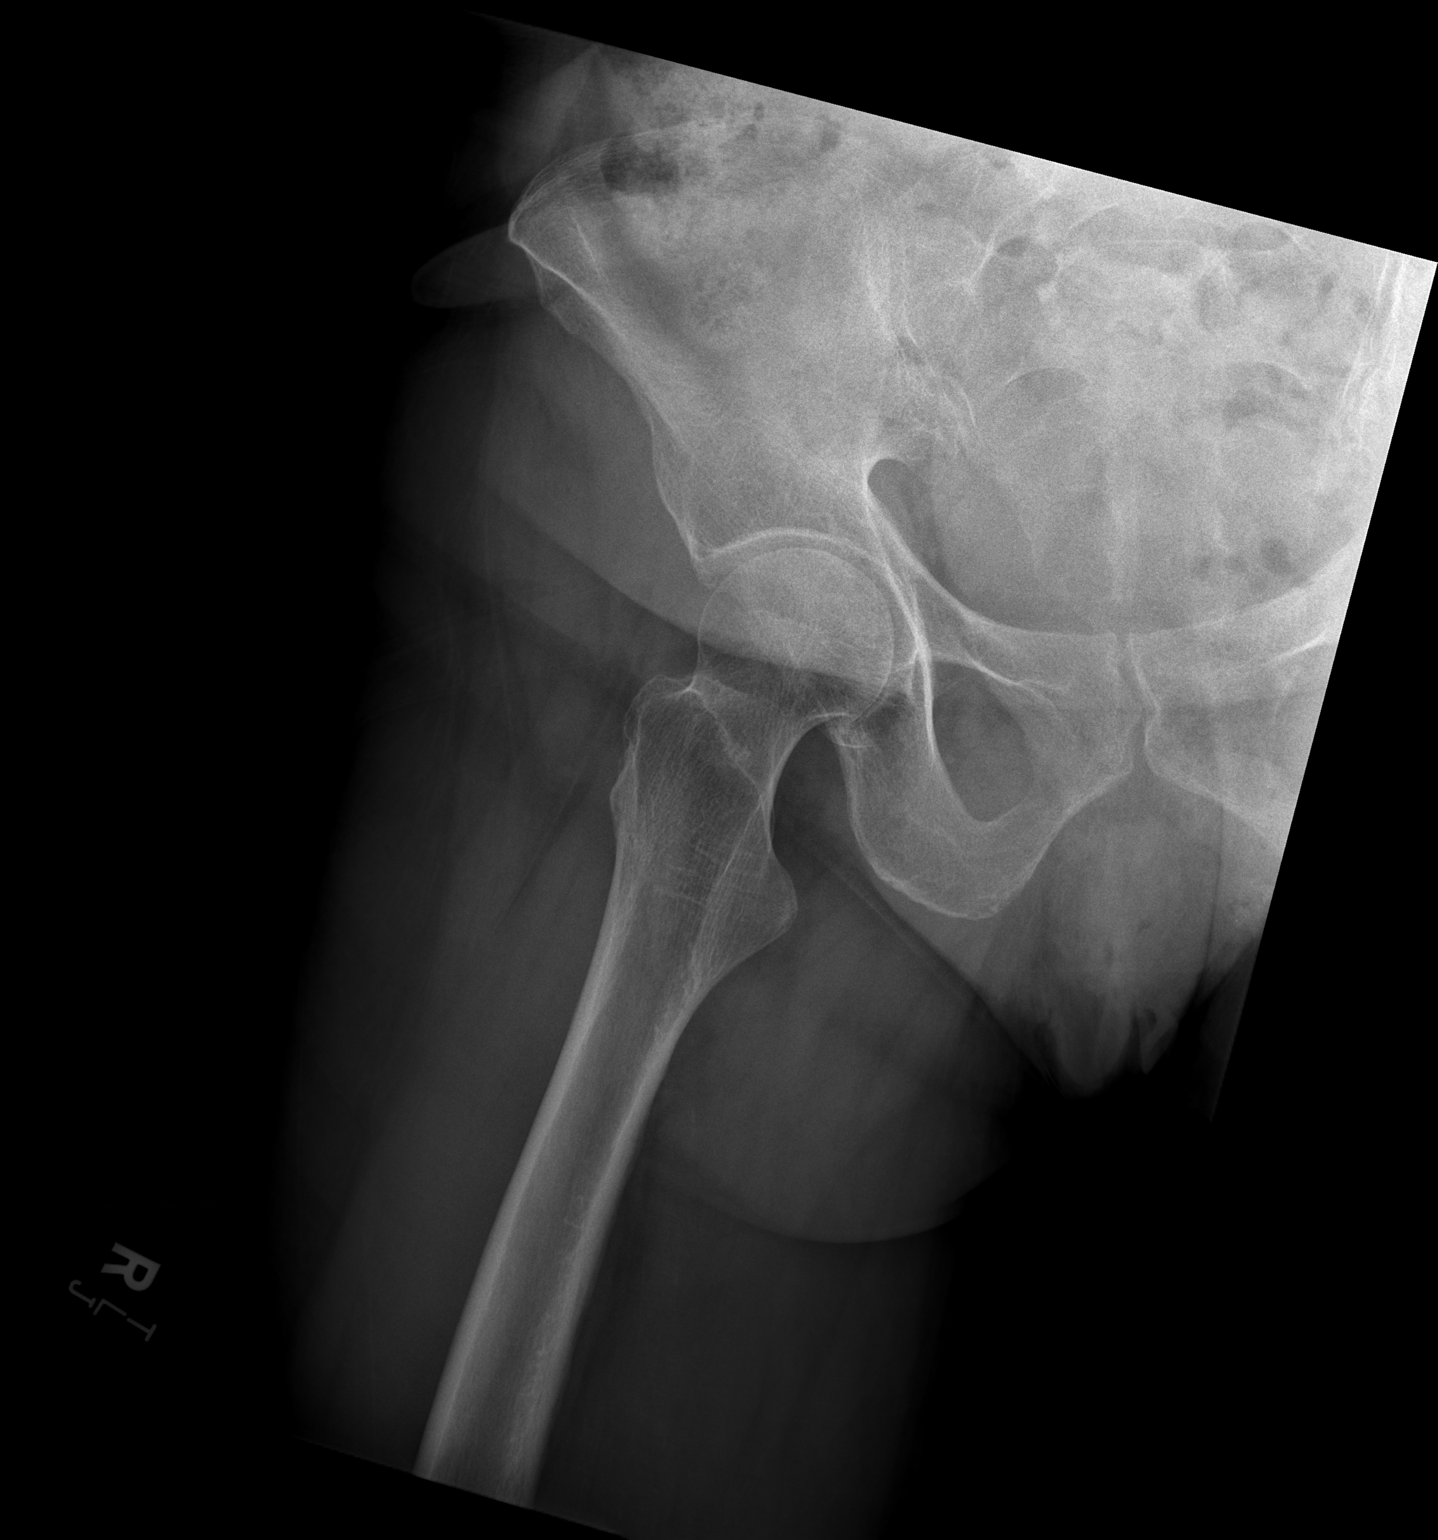

[2 of 2 positions shown; findings below may reference images not displayed]

FINDINGS: There is no evidence of hip fracture or dislocation. There is no
evidence of arthropathy or other focal bone abnormality.
IMPRESSION: No acute osseous abnormality identified.

## 2023-04-30 ENCOUNTER — Encounter: Payer: Self-pay | Admitting: Sports Medicine

## 2023-04-30 ENCOUNTER — Ambulatory Visit (INDEPENDENT_AMBULATORY_CARE_PROVIDER_SITE_OTHER): Payer: Self-pay | Admitting: Sports Medicine

## 2023-04-30 VITALS — Ht 63.25 in | Wt 122.0 lb

## 2023-04-30 DIAGNOSIS — M25552 Pain in left hip: Secondary | ICD-10-CM

## 2023-04-30 NOTE — Progress Notes (Signed)
L hip pain likely 2/2 Greater trochanteric pain syndrome given weakness of gluteus muscles and LE muscles in the setting of tethered spinal cord. She responded well to her previous session except for at night when trying to sleep on the effected side. She did not experience any side effects. She would like to proceed w/ another session today.   Procedure: ECSWT Indications:  L hip pain   Procedure Details Consent: Risks of procedure as well as the alternatives and risks of each were explained to the patient.  Written consent for procedure obtained. Time Out: Verified patient identification, verified procedure, site was marked, verified correct patient position, medications/allergies/relevent history reviewed.  The area was cleaned with alcohol swab.     The L hip/gluteal region was targeted for Extracorporeal shockwave therapy.    Preset: Trochanteric Bursitis Power Level: 90 MJ Frequency: 10 hz Impulse/cycles: 2000  Head size: large  *power level and frequency decreased slightly compared to last time due to tolerance.   Patient tolerated procedure well without immediate complications. We will see her in 1 more week for repeat session and re evaluate after 4 sessions.   Rica Mote MD [facility] Sports Medicine Fellow

## 2023-05-03 DIAGNOSIS — M5416 Radiculopathy, lumbar region: Secondary | ICD-10-CM | POA: Diagnosis not present

## 2023-05-03 DIAGNOSIS — M6281 Muscle weakness (generalized): Secondary | ICD-10-CM | POA: Diagnosis not present

## 2023-05-04 ENCOUNTER — Ambulatory Visit: Payer: Self-pay | Admitting: Sports Medicine

## 2023-05-05 DIAGNOSIS — M5416 Radiculopathy, lumbar region: Secondary | ICD-10-CM | POA: Diagnosis not present

## 2023-05-05 DIAGNOSIS — M6281 Muscle weakness (generalized): Secondary | ICD-10-CM | POA: Diagnosis not present

## 2023-05-06 DIAGNOSIS — Z1231 Encounter for screening mammogram for malignant neoplasm of breast: Secondary | ICD-10-CM | POA: Diagnosis not present

## 2023-05-11 ENCOUNTER — Ambulatory Visit: Payer: Self-pay | Admitting: Sports Medicine

## 2023-05-11 DIAGNOSIS — M6281 Muscle weakness (generalized): Secondary | ICD-10-CM | POA: Diagnosis not present

## 2023-05-11 DIAGNOSIS — M5416 Radiculopathy, lumbar region: Secondary | ICD-10-CM | POA: Diagnosis not present

## 2023-05-13 DIAGNOSIS — R922 Inconclusive mammogram: Secondary | ICD-10-CM | POA: Diagnosis not present

## 2023-05-13 DIAGNOSIS — N63 Unspecified lump in unspecified breast: Secondary | ICD-10-CM | POA: Diagnosis not present

## 2023-05-14 DIAGNOSIS — M5416 Radiculopathy, lumbar region: Secondary | ICD-10-CM | POA: Diagnosis not present

## 2023-05-14 DIAGNOSIS — M6281 Muscle weakness (generalized): Secondary | ICD-10-CM | POA: Diagnosis not present

## 2023-05-18 ENCOUNTER — Ambulatory Visit: Payer: PPO | Admitting: Family Medicine

## 2023-05-18 DIAGNOSIS — M5416 Radiculopathy, lumbar region: Secondary | ICD-10-CM | POA: Diagnosis not present

## 2023-05-18 DIAGNOSIS — M6281 Muscle weakness (generalized): Secondary | ICD-10-CM | POA: Diagnosis not present

## 2023-05-19 ENCOUNTER — Other Ambulatory Visit: Payer: Self-pay | Admitting: Radiology

## 2023-05-19 DIAGNOSIS — D0512 Intraductal carcinoma in situ of left breast: Secondary | ICD-10-CM | POA: Diagnosis not present

## 2023-05-19 DIAGNOSIS — C50412 Malignant neoplasm of upper-outer quadrant of left female breast: Secondary | ICD-10-CM | POA: Diagnosis not present

## 2023-05-19 DIAGNOSIS — D0511 Intraductal carcinoma in situ of right breast: Secondary | ICD-10-CM | POA: Diagnosis not present

## 2023-05-19 DIAGNOSIS — R922 Inconclusive mammogram: Secondary | ICD-10-CM | POA: Diagnosis not present

## 2023-05-24 ENCOUNTER — Telehealth: Payer: Self-pay | Admitting: Hematology and Oncology

## 2023-05-24 DIAGNOSIS — M545 Low back pain, unspecified: Secondary | ICD-10-CM | POA: Diagnosis not present

## 2023-05-24 NOTE — Telephone Encounter (Signed)
Spoke to patient to confirm upcoming afternoon [facility] clinic appointment on 8/21, paperwork will be sent via [employer].   Gave location and time, also informed patient that the surgeon's office would be calling as well to get information from them similar to the packet that they will be receiving so make sure to do both.  Reminded patient that all providers will be coming to the clinic to see them HERE and if they had any questions to not hesitate to reach back out to myself or their navigators.

## 2023-05-31 ENCOUNTER — Encounter: Payer: Self-pay | Admitting: *Deleted

## 2023-05-31 DIAGNOSIS — C50212 Malignant neoplasm of upper-inner quadrant of left female breast: Secondary | ICD-10-CM | POA: Diagnosis not present

## 2023-05-31 DIAGNOSIS — C50412 Malignant neoplasm of upper-outer quadrant of left female breast: Secondary | ICD-10-CM

## 2023-06-01 DIAGNOSIS — N6041 Mammary duct ectasia of right breast: Secondary | ICD-10-CM | POA: Diagnosis not present

## 2023-06-01 DIAGNOSIS — R922 Inconclusive mammogram: Secondary | ICD-10-CM | POA: Diagnosis not present

## 2023-06-01 DIAGNOSIS — N6321 Unspecified lump in the left breast, upper outer quadrant: Secondary | ICD-10-CM | POA: Diagnosis not present

## 2023-06-01 NOTE — Progress Notes (Signed)
Radiation Oncology         [phone] ________________________________  Initial Outpatient Consultation  Name: Regina [name] MRN: [id number]  Date: 06/02/2023  DOB: 1941/03/12  CC:Regina Dimitri, MD  Regina Miner, MD   REFERRING PHYSICIAN: [name], MD  DIAGNOSIS: No diagnosis found.   Cancer Staging  No matching staging information was found for the patient.  Stage *** Left Breast UIQ, Invasive Ductal Carcinoma, ER+ / PR- / Her2-, Grade 2  CHIEF COMPLAINT: Here to discuss management of left breast cancer  HISTORY OF PRESENT ILLNESS::Regina [name] is a 82 y.o. female who presented with breast abnormality on the following imaging: *** on the date of ***.  Symptoms, if any, at that time, were: ***.   Ultrasound of breast on *** revealed ***.     Biopsy of the 1 o'clock left breast on date of 05/19/23 showed grade 2 invasive ductal carcinoma measuring 6.5 mm in the greatest linear extent of the sample.  ER status: 80% positive with moderate-strong staining intensity; PR status 0% negative, Her2 status negative; Proliferation marker Ki67 at 30%; Grade 2.   ***  PREVIOUS RADIATION THERAPY: {EXAM; YES/NO:19492::"No"}  PAST MEDICAL HISTORY:  has a past medical history of Diverticulosis, GERD (gastroesophageal reflux disease), Headache, Heterozygous factor V Leiden mutation (HCC), Hiatal hernia, Insomnia, Nervous system disorder, Neurogenic bladder, Osteoporosis, Osteoporosis, Tubular adenoma of colon, and Urinary incontinence.    PAST SURGICAL HISTORY: Past Surgical History:  Procedure Laterality Date   44 HOUR PH STUDY N/A 03/24/2023   Procedure: 24 HOUR PH STUDY;  Surgeon: Beverley Fiedler, MD;  Location: [facility];  Service: Gastroenterology;  Laterality: N/A;   dental implants     ESOPHAGEAL MANOMETRY N/A 03/24/2023   Procedure: ESOPHAGEAL MANOMETRY (EM);  Surgeon: Beverley Fiedler, MD;  Location: [facility];  Service: Gastroenterology;  Laterality: N/A;    PARTIAL HYSTERECTOMY  10/12/1994   SALPINGOOPHORECTOMY Right    TUBAL LIGATION      FAMILY HISTORY: family history includes Asthma in her mother; CAD in her brother and father; COPD in her mother; Cancer in her paternal grandmother; Glaucoma in her mother; Heart attack in her father, maternal grandfather, and maternal grandmother; Hypertension in her mother; Macular degeneration in her mother; Osteoporosis in her mother; Skin cancer in her brother; Stroke in her mother.  SOCIAL HISTORY:  reports that she quit smoking about 50 years ago. Her smoking use included cigarettes. She started smoking about 54 years ago. She has a 12 pack-year smoking history. She has never used smokeless tobacco. She reports current alcohol use. She reports that she does not use drugs.  ALLERGIES: Other, Ketoprofen, Tramadol, Oxycodone, and Venlafaxine  MEDICATIONS:  Current Outpatient Medications  Medication Sig Dispense Refill   ALPRAZOLAM PO Take by mouth as needed.     Ascorbic Acid (VITAMIN C) 1000 MG tablet Take 1,000 mg by mouth daily.      aspirin 81 MG EC tablet Take by mouth 2 (two) times a week.     atorvastatin (LIPITOR) 10 MG tablet Take 1 tablet (10 mg total) by mouth daily. 90 tablet 2   b complex vitamins capsule Take 1 capsule by mouth daily.     calcium citrate-vitamin D (CITRACAL+D) 315-200 MG-UNIT tablet Take 1 tablet by mouth every morning.     Cholecalciferol (VITAMIN D3) 2000 units TABS Take 2,000 Units by mouth daily.     CHROMIUM PO Take 200 mg by mouth daily.     famotidine (PEPCID)  20 MG tablet Take 1 tablet (20 mg total) by mouth at bedtime as needed for heartburn or indigestion. 90 tablet 1   Fexofenadine HCl (MUCINEX ALLERGY PO) Take 1 tablet by mouth daily.     gabapentin (NEURONTIN) 300 MG capsule Take 300 mg by mouth daily in the afternoon.     Loratadine 10 MG CAPS Take 1 capsule by mouth daily at 6 (six) AM. Fransisco Hertz brand     Lutein 6 MG CAPS Take 1 capsule by mouth with  breakfast, with lunch, and with evening meal.     Lysine 1000 MG TABS Take 1 tablet by mouth daily.     Magnesium 500 MG CAPS Take 1 capsule by mouth daily.     mirabegron ER (MYRBETRIQ) 50 MG TB24 tablet Take 1 tablet by mouth daily.     montelukast (SINGULAIR) 10 MG tablet Take 1 tablet (10 mg total) by mouth at bedtime. 90 tablet 3   Multiple Vitamins-Minerals (WOMENS BONE HEALTH PO) Take 1 tablet by mouth daily.     nitroGLYCERIN (NITROSTAT) 0.4 MG SL tablet Place 1 tablet (0.4 mg total) under the tongue as needed. One tablet as needed for chest tightness 25 tablet 3   Omega-3 Fatty Acids (OMEGA 3 500 PO) Take 1 tablet by mouth daily at 6 (six) AM.     omeprazole (PRILOSEC) 40 MG capsule Take 40 mg by mouth 2 (two) times daily.     Zinc Sulfate (ZINC-220 PO) Take 1 tablet by mouth daily at 6 (six) AM.     No current facility-administered medications for this encounter.    REVIEW OF SYSTEMS: As above in HPI.   PHYSICAL EXAM:  vitals were not taken for this visit.   General: Alert and oriented, in no acute distress HEENT: Head is normocephalic. Extraocular movements are intact. Oropharynx is clear. Neck: Neck is supple, no palpable cervical or supraclavicular lymphadenopathy. Heart: Regular in rate and rhythm with no murmurs, rubs, or gallops. Chest: Clear to auscultation bilaterally, with no rhonchi, wheezes, or rales. Abdomen: Soft, nontender, nondistended, with no rigidity or guarding. Extremities: No cyanosis or edema. Lymphatics: see Neck Exam Skin: No concerning lesions. Musculoskeletal: symmetric strength and muscle tone throughout. Neurologic: Cranial nerves II through XII are grossly intact. No obvious focalities. Speech is fluent. Coordination is intact. Psychiatric: Judgment and insight are intact. Affect is appropriate. Breasts: *** . No other palpable masses appreciated in the breasts or axillae *** .    ECOG = ***  0 - Asymptomatic (Fully active, able to carry on  all predisease activities without restriction)  1 - Symptomatic but completely ambulatory (Restricted in physically strenuous activity but ambulatory and able to carry out work of a light or sedentary nature. For example, light housework, office work)  2 - Symptomatic, <50% in bed during the day (Ambulatory and capable of all self care but unable to carry out any work activities. Up and about more than 50% of waking hours)  3 - Symptomatic, >50% in bed, but not bedbound (Capable of only limited self-care, confined to bed or chair 50% or more of waking hours)  4 - Bedbound (Completely disabled. Cannot carry on any self-care. Totally confined to bed or chair)  5 - Death   Santiago Glad MM, Creech RH, Tormey DC, et al. [phone]). "Toxicity and response criteria of the [facility]". Am. Evlyn Clines. Oncol. 5 (6): 649-55   LABORATORY DATA:  Lab Results  Component Value Date   WBC 6.9 05/01/2022  HGB 13.5 05/01/2022   HCT 40.9 05/01/2022   MCV 91.9 05/01/2022   PLT 211 05/01/2022   CMP     Component Value Date/Time   NA 143 11/26/2022 0841   K 3.9 11/26/2022 0841   CL 104 11/26/2022 0841   CO2 26 11/26/2022 0841   GLUCOSE 92 11/26/2022 0841   GLUCOSE 81 05/01/2022 1208   BUN 17 11/26/2022 0841   CREATININE 0.67 11/26/2022 0841   CALCIUM 8.9 11/26/2022 0841   PROT 6.7 01/18/2020 0202   ALBUMIN 3.8 01/18/2020 0202   AST 24 01/18/2020 0202   ALT 26 01/18/2020 0202   ALKPHOS 65 01/18/2020 0202   BILITOT 0.6 01/18/2020 0202   GFRNONAA >60 05/01/2022 1208   GFRAA >60 01/18/2020 0202         RADIOGRAPHY: No results found.    IMPRESSION/PLAN: ***   It was a pleasure meeting the patient today. We discussed the risks, benefits, and side effects of radiotherapy. I recommend radiotherapy to the *** to reduce her risk of locoregional recurrence by 2/3.  We discussed that radiation would take approximately *** weeks to complete and that I would give the patient a few weeks to  heal following surgery before starting treatment planning. *** If chemotherapy were to be given, this would precede radiotherapy. We spoke about acute effects including skin irritation and fatigue as well as much less common late effects including internal organ injury or irritation. We spoke about the latest technology that is used to minimize the risk of late effects for patients undergoing radiotherapy to the breast or chest wall. No guarantees of treatment were given. The patient is enthusiastic about proceeding with treatment. I look forward to participating in the patient's care.  I will await her referral back to me for postoperative follow-up and eventual CT simulation/treatment planning.  On date of service, in total, I spent *** minutes on this encounter. Patient was seen in person.   __________________________________________   Lonie Peak, MD  This document serves as a record of services personally performed by Lonie Peak, MD. It was created on her behalf by Neena Rhymes, a trained medical scribe. The creation of this record is based on the scribe's personal observations and the provider's statements to them. This document has been checked and approved by the attending provider.

## 2023-06-02 ENCOUNTER — Encounter: Payer: Self-pay | Admitting: *Deleted

## 2023-06-02 ENCOUNTER — Inpatient Hospital Stay: Payer: PPO

## 2023-06-02 ENCOUNTER — Ambulatory Visit
Admission: RE | Admit: 2023-06-02 | Discharge: 2023-06-02 | Disposition: A | Discharge status: Home / Self Care | Payer: PPO | Source: Ambulatory Visit | Attending: Radiation Oncology | Admitting: Radiation Oncology

## 2023-06-02 ENCOUNTER — Inpatient Hospital Stay: Payer: PPO | Admitting: Genetic Counselor

## 2023-06-02 ENCOUNTER — Inpatient Hospital Stay: Payer: PPO | Attending: Hematology and Oncology | Admitting: Hematology and Oncology

## 2023-06-02 ENCOUNTER — Ambulatory Visit: Payer: Self-pay | Admitting: General Surgery

## 2023-06-02 VITALS — BP 138/76 | HR 70 | Temp 97.4°F | Resp 18 | Ht 63.25 in | Wt 124.9 lb

## 2023-06-02 DIAGNOSIS — C50412 Malignant neoplasm of upper-outer quadrant of left female breast: Secondary | ICD-10-CM

## 2023-06-02 DIAGNOSIS — Z8042 Family history of malignant neoplasm of prostate: Secondary | ICD-10-CM

## 2023-06-02 DIAGNOSIS — K219 Gastro-esophageal reflux disease without esophagitis: Secondary | ICD-10-CM | POA: Insufficient documentation

## 2023-06-02 DIAGNOSIS — Z79899 Other long term (current) drug therapy: Secondary | ICD-10-CM | POA: Insufficient documentation

## 2023-06-02 DIAGNOSIS — Z803 Family history of malignant neoplasm of breast: Secondary | ICD-10-CM | POA: Diagnosis not present

## 2023-06-02 DIAGNOSIS — Z170 Estrogen receptor positive status [ER+]: Secondary | ICD-10-CM | POA: Insufficient documentation

## 2023-06-02 DIAGNOSIS — D6851 Activated protein C resistance: Secondary | ICD-10-CM | POA: Insufficient documentation

## 2023-06-02 LAB — CMP (CANCER CENTER ONLY)
ALT: 24 U/L (ref 0–44)
AST: 24 U/L (ref 15–41)
Albumin: 4 g/dL (ref 3.5–5.0)
Alkaline Phosphatase: 66 U/L (ref 38–126)
Anion gap: 3 — ABNORMAL LOW (ref 5–15)
BUN: 17 mg/dL (ref 8–23)
CO2: 32 mmol/L (ref 22–32)
Calcium: 9.2 mg/dL (ref 8.9–10.3)
Chloride: 105 mmol/L (ref 98–111)
Creatinine: 0.74 mg/dL (ref 0.44–1.00)
GFR, Estimated: >60 mL/min (ref >60)
Glucose, Bld: 140 mg/dL — ABNORMAL HIGH (ref 70–99)
Potassium: 4.4 mmol/L (ref 3.5–5.1)
Sodium: 140 mmol/L (ref 135–145)
Total Bilirubin: 0.6 mg/dL (ref 0.3–1.2)
Total Protein: 6.6 g/dL (ref 6.5–8.1)

## 2023-06-02 LAB — CBC WITH DIFFERENTIAL (CANCER CENTER ONLY)
Abs Immature Granulocytes: 0.02 10*3/uL (ref 0.00–0.07)
Basophils Absolute: 0.1 10*3/uL (ref 0.0–0.1)
Basophils Relative: 1 %
Eosinophils Absolute: 0.2 10*3/uL (ref 0.0–0.5)
Eosinophils Relative: 3 %
HCT: 42.4 % (ref 36.0–46.0)
Hemoglobin: 13.7 g/dL (ref 12.0–15.0)
Immature Granulocytes: 0 %
Lymphocytes Relative: 29 %
Lymphs Abs: 1.9 10*3/uL (ref 0.7–4.0)
MCH: 30.4 pg (ref 26.0–34.0)
MCHC: 32.3 g/dL (ref 30.0–36.0)
MCV: 94.2 fL (ref 80.0–100.0)
Monocytes Absolute: 0.5 10*3/uL (ref 0.1–1.0)
Monocytes Relative: 7 %
Neutro Abs: 3.9 10*3/uL (ref 1.7–7.7)
Neutrophils Relative %: 60 %
Platelet Count: 178 10*3/uL (ref 150–400)
RBC: 4.5 MIL/uL (ref 3.87–5.11)
RDW: 13.3 % (ref 11.5–15.5)
WBC Count: 6.5 10*3/uL (ref 4.0–10.5)
nRBC: 0 % (ref 0.0–0.2)

## 2023-06-02 LAB — GENETIC SCREENING ORDER

## 2023-06-02 NOTE — Research (Signed)
Trial:  [employer] 2021-05 - Specimen Collection Study to Evaluate Biomarkers in Subjects with Cancer   Patient Regina [name] was identified by Dr. Pamelia Hoit as a potential candidate for the above listed study.  This Clinical Research Nurse met with Regina [name], [id number], on 06/02/23 in a manner and location that ensures patient privacy to discuss participation in the above listed research study.  Patient is Accompanied by her husband .  A copy of the informed consent document with embedded HIPAA language was provided to the patient.  Patient reads, speaks, and understands [place].   Patient was provided with the business card of this Nurse and encouraged to contact the research team with any questions.  Approximately 5 minutes were spent with the patient reviewing the informed consent documents.  Patient was provided the option of taking informed consent documents home to review and was encouraged to review at their convenience with their support network, including other care providers. Patient took the consent documents home to review. Patient agreed for research staff to call her on Monday 06/07/23 to follow up and see if she has any questions.  Domenica Reamer, BSN, RN, [employer] Research Nurse II [phone] 06/02/2023 2:48 PM

## 2023-06-02 NOTE — Assessment & Plan Note (Signed)
05/19/2023:Screening mammogram detected left breast mass UOQ 2.2 cm (in 2021 patient had CSL: Went on surveillance), by ultrasound measured 1.6 cm at 1 o'clock position (entire area with CSL 2.6 cm), axilla negative, biopsy grade 2 IDC ER 80%, PR 0%, Ki67 30%, HER2 negative by [facility]  Pathology and radiology counseling:Discussed with the patient, the details of pathology including the type of breast cancer,the clinical staging, the significance of ER, PR and HER-2/neu receptors and the implications for treatment. After reviewing the pathology in detail, we proceeded to discuss the different treatment options between surgery, radiation, chemotherapy, antiestrogen therapies.  Recommendations: 1. Breast conserving surgery followed by 2. +/-Adjuvant radiation therapy followed by 3. Adjuvant antiestrogen therapy    Return to clinic after surgery to discuss final pathology report

## 2023-06-02 NOTE — Progress Notes (Signed)
[facility] CONSULT NOTE  Patient Care Team: [name], MD as PCP - General (Family Medicine) Donnelly Angelica, RN as Oncology Nurse Navigator Pershing Proud, RN as Oncology Nurse Navigator Serena Croissant, MD as Consulting Physician (Hematology and Oncology) Lonie Peak, MD as Attending Physician (Radiation Oncology) Griselda Miner, MD as Consulting Physician (General Surgery)  CHIEF COMPLAINTS/PURPOSE OF CONSULTATION:  Newly diagnosed breast cancer  HISTORY OF PRESENTING ILLNESS:  Regina [name] 82 y.o. female is here because of recent diagnosis of.Left breast cancer.  Patient had a previous history of complex sclerosing lesion 2021 which was under surveillance.  She had a screening mammogram that detected left breast upper outer quadrant mass measuring 2.2 cm.  By ultrasound it measured 1.6 cm at 1 o'clock position.  The entire area with the complex presentation measured 2.6 cm.  Axilla was negative.  Biopsy revealed grade 2 invasive ductal carcinoma that is ER positive PR negative HER2 negative with Ki-67 30%.  She was presented this morning in the multidisciplinary tumor board and she is here today accompanied by her husband to discuss her treatment plan.  I reviewed her records extensively and collaborated the history with the patient.  SUMMARY OF ONCOLOGIC HISTORY: Oncology History  Malignant neoplasm of upper-outer quadrant of left breast in female, estrogen receptor positive (HCC)  05/19/2023 Initial Diagnosis   Screening mammogram detected left breast mass UOQ 2.2 cm (in 2021 patient had CSL: Went on surveillance), by ultrasound measured 1.6 cm at 1 o'clock position (entire area with CSL 2.6 cm), axilla negative, biopsy grade 2 IDC ER 80%, PR 0%, Ki67 30%, HER2 negative by [facility]   06/02/2023 Cancer Staging   Staging form: Breast, AJCC 8th Edition - Clinical: Stage IB (cT2, cN0, cM0, G2, ER+, PR+, HER2-) - Signed by Serena Croissant, MD on 06/02/2023 Stage prefix:  Initial diagnosis Histologic grading system: 3 grade system      MEDICAL HISTORY:  Past Medical History:  Diagnosis Date   Diverticulosis    GERD (gastroesophageal reflux disease)    Headache    Heterozygous factor V Leiden mutation (HCC)    Hiatal hernia    Insomnia    Nervous system disorder    spinal cord tethered   Neurogenic bladder    Osteoporosis    Osteoporosis    Tubular adenoma of colon    Urinary incontinence     SURGICAL HISTORY: Past Surgical History:  Procedure Laterality Date   3 HOUR PH STUDY N/A 03/24/2023   Procedure: 24 HOUR PH STUDY;  Surgeon: Beverley Fiedler, MD;  Location: [facility];  Service: Gastroenterology;  Laterality: N/A;   dental implants     ESOPHAGEAL MANOMETRY N/A 03/24/2023   Procedure: ESOPHAGEAL MANOMETRY (EM);  Surgeon: Beverley Fiedler, MD;  Location: [facility];  Service: Gastroenterology;  Laterality: N/A;   PARTIAL HYSTERECTOMY  10/12/1994   SALPINGOOPHORECTOMY Right    TUBAL LIGATION      SOCIAL HISTORY: Social History   Socioeconomic History   Marital status: Married    Spouse name: John   Number of children: 0   Years of education: Some colle   Highest education level: Not on file  Occupational History   Occupation: Self employed   Occupation: Network engineer OF Leisure centre manager: [employer]  Tobacco Use   Smoking status: Former    Current packs/day: 0.00    Average packs/day: 3.0 packs/day for 4.0 years (12.0 ttl pk-yrs)    Types: Cigarettes  Start date: 12/10/1968    Quit date: 12/10/1972    Years since quitting: 50.5   Smokeless tobacco: Never  Substance and Sexual Activity   Alcohol use: Yes    Alcohol/week: 0.0 standard drinks of alcohol    Comment: 2-3 glasses wine/week   Drug use: No   Sexual activity: Not on file  Other Topics Concern   Not on file  Social History Narrative   Lives w/ husband    Caffeine use: 2-3 cups coffee per day   Social Determinants of Health   Financial Resource Strain: Not on  file  Food Insecurity: Not on file  Transportation Needs: Not on file  Physical Activity: Not on file  Stress: Not on file  Social Connections: Not on file  Intimate Partner Violence: Not on file    FAMILY HISTORY: Family History  Problem Relation Age of Onset   Asthma Mother    COPD Mother    Stroke Mother        TIA   Osteoporosis Mother    Hypertension Mother    Macular degeneration Mother    Glaucoma Mother    Heart attack Father    CAD Father    CAD Brother    Skin cancer Brother    Heart attack Maternal Grandmother    Heart attack Maternal Grandfather    Cancer Paternal Grandmother    Migraines Neg Hx    Colon cancer Neg Hx    Colon polyps Neg Hx    Liver disease Neg Hx     ALLERGIES:  is allergic to other, ketoprofen, tramadol, oxycodone, and venlafaxine.  MEDICATIONS:  Current Outpatient Medications  Medication Sig Dispense Refill   ALPRAZOLAM PO Take by mouth as needed.     Ascorbic Acid (VITAMIN C) 1000 MG tablet Take 1,000 mg by mouth daily.      aspirin 81 MG EC tablet Take by mouth 2 (two) times a week.     atorvastatin (LIPITOR) 10 MG tablet Take 1 tablet (10 mg total) by mouth daily. 90 tablet 2   b complex vitamins capsule Take 1 capsule by mouth daily.     calcium citrate-vitamin D (CITRACAL+D) 315-200 MG-UNIT tablet Take 1 tablet by mouth every morning.     Cholecalciferol (VITAMIN D3) 2000 units TABS Take 2,000 Units by mouth daily.     CHROMIUM PO Take 200 mg by mouth daily.     famotidine (PEPCID) 20 MG tablet Take 1 tablet (20 mg total) by mouth at bedtime as needed for heartburn or indigestion. 90 tablet 1   Fexofenadine HCl (MUCINEX ALLERGY PO) Take 1 tablet by mouth daily.     gabapentin (NEURONTIN) 300 MG capsule Take 300 mg by mouth daily in the afternoon.     Loratadine 10 MG CAPS Take 1 capsule by mouth daily at 6 (six) AM. Fransisco Hertz brand     Lutein 6 MG CAPS Take 1 capsule by mouth with breakfast, with lunch, and with evening meal.      Lysine 1000 MG TABS Take 1 tablet by mouth daily.     Magnesium 500 MG CAPS Take 1 capsule by mouth daily.     mirabegron ER (MYRBETRIQ) 50 MG TB24 tablet Take 1 tablet by mouth daily.     montelukast (SINGULAIR) 10 MG tablet Take 1 tablet (10 mg total) by mouth at bedtime. 90 tablet 3   Multiple Vitamins-Minerals (WOMENS BONE HEALTH PO) Take 1 tablet by mouth daily.     nitroGLYCERIN (NITROSTAT) 0.4  MG SL tablet Place 1 tablet (0.4 mg total) under the tongue as needed. One tablet as needed for chest tightness 25 tablet 3   Omega-3 Fatty Acids (OMEGA 3 500 PO) Take 1 tablet by mouth daily at 6 (six) AM.     omeprazole (PRILOSEC) 40 MG capsule Take 40 mg by mouth 2 (two) times daily.     Zinc Sulfate (ZINC-220 PO) Take 1 tablet by mouth daily at 6 (six) AM.     No current facility-administered medications for this visit.    REVIEW OF SYSTEMS:   Constitutional: Denies fevers, chills or abnormal night sweats Breast:  Denies any palpable lumps or discharge All other systems were reviewed with the patient and are negative.  PHYSICAL EXAMINATION: ECOG PERFORMANCE STATUS: 1 - Symptomatic but completely ambulatory  Vitals:   06/02/23 1239  BP: 138/76  Pulse: 70  Resp: 18  Temp: (!) 97.4 F (36.3 C)  SpO2: 99%   Filed Weights   06/02/23 1239  Weight: 124 lb 14.4 oz (56.7 kg)    GENERAL:alert, no distress and comfortable    LABORATORY DATA:  I have reviewed the data as listed Lab Results  Component Value Date   WBC 6.5 06/02/2023   HGB 13.7 06/02/2023   HCT 42.4 06/02/2023   MCV 94.2 06/02/2023   PLT 178 06/02/2023   Lab Results  Component Value Date   NA 140 06/02/2023   K 4.4 06/02/2023   CL 105 06/02/2023   CO2 32 06/02/2023    RADIOGRAPHIC STUDIES: I have personally reviewed the radiological reports and agreed with the findings in the report.  ASSESSMENT AND PLAN:  Malignant neoplasm of upper-outer quadrant of left breast in female, estrogen receptor positive  (HCC) 05/19/2023:Screening mammogram detected left breast mass UOQ 2.2 cm (in 2021 patient had CSL: Went on surveillance), by ultrasound measured 1.6 cm at 1 o'clock position (entire area with CSL 2.6 cm), axilla negative, biopsy grade 2 IDC ER 80%, PR 0%, Ki67 30%, HER2 negative by [facility]  Pathology and radiology counseling:Discussed with the patient, the details of pathology including the type of breast cancer,the clinical staging, the significance of ER, PR and HER-2/neu receptors and the implications for treatment. After reviewing the pathology in detail, we proceeded to discuss the different treatment options between surgery, radiation, chemotherapy, antiestrogen therapies.  Recommendations: 1. Breast conserving surgery followed by 2. Adjuvant radiation therapy followed by 3. Adjuvant antiestrogen therapy   Patient's husband is also one of my patients. Return to clinic after surgery to discuss final pathology report   All questions were answered. The patient knows to call the clinic with any problems, questions or concerns.    Tamsen Meek, MD 06/02/23

## 2023-06-03 ENCOUNTER — Encounter: Payer: Self-pay | Admitting: Radiation Oncology

## 2023-06-03 ENCOUNTER — Encounter: Payer: Self-pay | Admitting: Genetic Counselor

## 2023-06-03 NOTE — Progress Notes (Signed)
REFERRING PROVIDER: Serena Croissant, MD [address] [place],  [place] [address]  PRIMARY PROVIDER:  [name], MD  PRIMARY REASON FOR VISIT:  1. Malignant neoplasm of upper-outer quadrant of left breast in female, estrogen receptor positive (HCC)   2. Family history of breast cancer   3. Family history of prostate cancer     HISTORY OF PRESENT ILLNESS:   Ms. Carey, a 82 y.o. female, was seen for a [place] cancer genetics consultation at the request of Dr. Pamelia Hoit due to a personal history of breast cancer.  Ms. Kuwahara presents to clinic today to discuss the possibility of a hereditary predisposition to cancer, to discuss genetic testing, and to further clarify her future cancer risks, as well as potential cancer risks for family members.   In August 2024, at the age of 6, Ms. Havey was diagnosed with invasive ductal carcinoma of the left breast (ER+/PR-/HER2-). The treatment plan is pending.   CANCER HISTORY:  Oncology History  Malignant neoplasm of upper-outer quadrant of left breast in female, estrogen receptor positive (HCC)  05/19/2023 Initial Diagnosis   Screening mammogram detected left breast mass UOQ 2.2 cm (in 2021 patient had CSL: Went on surveillance), by ultrasound measured 1.6 cm at 1 o'clock position (entire area with CSL 2.6 cm), axilla negative, biopsy grade 2 IDC ER 80%, PR 0%, Ki67 30%, HER2 negative by [facility]   06/02/2023 Cancer Staging   Staging form: Breast, AJCC 8th Edition - Clinical: Stage IB (cT2, cN0, cM0, G2, ER+, PR+, HER2-) - Signed by Serena Croissant, MD on 06/02/2023 Stage prefix: Initial diagnosis Histologic grading system: 3 grade system      Past Medical History:  Diagnosis Date   Breast cancer (HCC)    Diverticulosis    GERD (gastroesophageal reflux disease)    Headache    Heterozygous factor V Leiden mutation (HCC)    Hiatal hernia    Insomnia    Nervous system disorder    spinal cord tethered   Neurogenic bladder     Osteoporosis    Osteoporosis    Tubular adenoma of colon    Urinary incontinence     Past Surgical History:  Procedure Laterality Date   22 HOUR PH STUDY N/A 03/24/2023   Procedure: 24 HOUR PH STUDY;  Surgeon: Beverley Fiedler, MD;  Location: [facility];  Service: Gastroenterology;  Laterality: N/A;   dental implants     ESOPHAGEAL MANOMETRY N/A 03/24/2023   Procedure: ESOPHAGEAL MANOMETRY (EM);  Surgeon: Beverley Fiedler, MD;  Location: [facility];  Service: Gastroenterology;  Laterality: N/A;   PARTIAL HYSTERECTOMY  10/12/1994   SALPINGOOPHORECTOMY Right    TUBAL LIGATION      FAMILY HISTORY:  We obtained a detailed, 4-generation family history.  Significant diagnoses are listed below: Family History  Problem Relation Age of Onset   Skin cancer Father    Melanoma Brother        dx >50   Brain cancer Maternal Uncle        dx unknown age   Breast cancer Paternal Grandmother 87   Prostate cancer Paternal Grandfather 22    Ms. Hirayama is unaware of previous family history of genetic testing for hereditary cancer risks. There is no reported Ashkenazi Jewish ancestry. There is no known consanguinity.  GENETIC COUNSELING ASSESSMENT: Ms. Roesel is a 82 y.o. female with a personal and family history which is somewhat suggestive of a hereditary cancer syndrome and predisposition to cancer given the presence of breast  cancer in multiple generations. We, therefore, discussed and recommended the following at today's visit.   DISCUSSION: We discussed that 5 - 10% of cancer is hereditary.  Most cases of hereditary breast cancer are associated with mutations in BRCA1/2.  There are other genes that can be associated with hereditary breast cancer syndromes.  We discussed that testing is beneficial for several reasons including knowing how to follow individuals for their cancer risks and understanding if other family members could be at risk for cancer and allowing them to undergo genetic testing.    We reviewed the characteristics, features and inheritance patterns of hereditary cancer syndromes. We also discussed genetic testing, including the appropriate family members to test, the process of testing, insurance coverage and turn-around-time for results. We discussed the implications of a negative, positive, carrier and/or variant of uncertain significant result. We recommended Ms. Pfeifer pursue genetic testing for a panel that includes genes associated with breast, prostate cancer, skin cancer, and other cancers.   The [facility] CustomNext-Cancer +RNAinsight Panel includes sequencing, deletion/duplication analysis, and RNA analysis for the following 38 genes: APC, ATM, AXIN2, BAP1, BARD1, BMPR1A, BRCA1, BRCA2, BRIP1, CDH1, CDK4, CDKN2A, CHEK2, DICER1, EPCAM, GREM1, HOXB13, MITF, MLH1, MSH2, MSH3, MSH6, MUTYH, NF1, NTHL1, PALB2, PMS2, POLD1, POLE, POT1, PTEN, RAD51C, RAD51D, RB1, SMAD4, SMARCA4, STK11, TP53.  Based on Ms. Pantaleo's personal history of breast cancer and family history of a paternal grandmother with breast cancer at 56, she meets medical criteria for genetic testing. Despite that she meets criteria, she may still have an out of pocket cost. We discussed that if her out of pocket cost for testing is over $100, the laboratory should contact her and discuss the self-pay prices and/or patient pay assistance programs.    PLAN: After considering the risks, benefits, and limitations, Ms. Pascucci provided informed consent to pursue genetic testing and the blood sample was sent to [facility] for analysis of the CustomNext-Cancer +RNAinsight Panel. Results should be available within approximately 3 weeks' time, at which point they will be disclosed by telephone to Ms. Tuckett, as will any additional recommendations warranted by these results. Ms. Blaski will receive a summary of her genetic counseling visit and a copy of her results once available. This information will also be available  in Epic.   Ms. Erdos questions were answered to her satisfaction today. Our contact information was provided should additional questions or concerns arise. Thank you for the referral and allowing [place] to share in the care of your patient.   Kiani Wurtzel M. Rennie Plowman, MS, [facility] Genetic Counselor [email or web address] (P) [phone]  The patient was seen for a total of 15 minutes in face-to-face genetic counseling.  Dr. Pamelia Hoit was available to discuss this case as needed.    _______________________________________________________________________ For Office Staff:  Number of people involved in session: 2 Was an Intern/ student involved with case: no

## 2023-06-08 ENCOUNTER — Other Ambulatory Visit: Payer: Self-pay

## 2023-06-08 ENCOUNTER — Encounter: Payer: Self-pay | Admitting: *Deleted

## 2023-06-08 ENCOUNTER — Encounter (HOSPITAL_BASED_OUTPATIENT_CLINIC_OR_DEPARTMENT_OTHER): Payer: Self-pay | Admitting: General Surgery

## 2023-06-08 ENCOUNTER — Telehealth: Payer: Self-pay | Admitting: *Deleted

## 2023-06-08 DIAGNOSIS — M6281 Muscle weakness (generalized): Secondary | ICD-10-CM | POA: Diagnosis not present

## 2023-06-08 DIAGNOSIS — M5416 Radiculopathy, lumbar region: Secondary | ICD-10-CM | POA: Diagnosis not present

## 2023-06-08 DIAGNOSIS — C50412 Malignant neoplasm of upper-outer quadrant of left female breast: Secondary | ICD-10-CM

## 2023-06-08 NOTE — Telephone Encounter (Signed)
Spoke to pt concerning BMDC from 8.21.24. Denies questions or concerns regarding dx or treatment can plan. Discusses post surgical plan with f/u with Dr. Pamelia Hoit and Basilio Cairo. Pt will be going on vacation to [place] on 10\6-10/16 Informed pt that referral will sent and vacation dates will be documented. No further needs voiced at this time.

## 2023-06-10 DIAGNOSIS — R159 Full incontinence of feces: Secondary | ICD-10-CM | POA: Diagnosis not present

## 2023-06-10 DIAGNOSIS — Z6822 Body mass index (BMI) 22.0-22.9, adult: Secondary | ICD-10-CM | POA: Diagnosis not present

## 2023-06-10 DIAGNOSIS — M810 Age-related osteoporosis without current pathological fracture: Secondary | ICD-10-CM | POA: Diagnosis not present

## 2023-06-10 DIAGNOSIS — C50912 Malignant neoplasm of unspecified site of left female breast: Secondary | ICD-10-CM | POA: Diagnosis not present

## 2023-06-10 DIAGNOSIS — Q068 Other specified congenital malformations of spinal cord: Secondary | ICD-10-CM | POA: Diagnosis not present

## 2023-06-10 DIAGNOSIS — R053 Chronic cough: Secondary | ICD-10-CM | POA: Diagnosis not present

## 2023-06-10 DIAGNOSIS — D6851 Activated protein C resistance: Secondary | ICD-10-CM | POA: Diagnosis not present

## 2023-06-10 DIAGNOSIS — D179 Benign lipomatous neoplasm, unspecified: Secondary | ICD-10-CM | POA: Diagnosis not present

## 2023-06-11 ENCOUNTER — Telehealth: Payer: Self-pay

## 2023-06-11 ENCOUNTER — Encounter: Payer: Self-pay | Admitting: General Practice

## 2023-06-11 DIAGNOSIS — M6281 Muscle weakness (generalized): Secondary | ICD-10-CM | POA: Diagnosis not present

## 2023-06-11 DIAGNOSIS — M5416 Radiculopathy, lumbar region: Secondary | ICD-10-CM | POA: Diagnosis not present

## 2023-06-11 NOTE — Progress Notes (Signed)
[facility] Multidisciplinary Clinic Spiritual Care Note  Left voicemail for [facility] following Breast Multidisciplinary Clinic to introduce Support Center team/resources. She completed SDOH screening; results follow below.    SDOH Screenings   Food Insecurity: No Food Insecurity (06/11/2023)  Housing: Low Risk  (06/11/2023)  Transportation Needs: No Transportation Needs (06/11/2023)  Utilities: Not At Risk (06/11/2023)  Depression (PHQ2-9): Low Risk  (06/11/2023)  Tobacco Use: Medium Risk (06/08/2023)    Follow up needed: Yes.  Plan to phone again if needed.   [address] [place], [place], [facility] Pager [phone] Voicemail [phone]

## 2023-06-11 NOTE — Telephone Encounter (Signed)
[employer] 2021-05 - Specimen Collection Study to Evaluate Biomarkers in Subjects with Cancer   Left a voicemail with information about he study and my contact information. Will try to contact the  patient again  next week.  Felecia Jan, [facility] 06/11/2023 4:13 PM

## 2023-06-15 ENCOUNTER — Telehealth: Payer: Self-pay | Admitting: Genetic Counselor

## 2023-06-15 ENCOUNTER — Encounter: Payer: Self-pay | Admitting: Genetic Counselor

## 2023-06-15 DIAGNOSIS — Z1379 Encounter for other screening for genetic and chromosomal anomalies: Secondary | ICD-10-CM | POA: Insufficient documentation

## 2023-06-15 DIAGNOSIS — M6281 Muscle weakness (generalized): Secondary | ICD-10-CM | POA: Diagnosis not present

## 2023-06-15 DIAGNOSIS — M5416 Radiculopathy, lumbar region: Secondary | ICD-10-CM | POA: Diagnosis not present

## 2023-06-15 NOTE — Telephone Encounter (Signed)
Contacted patient in attempt to disclose results of genetic testing.  LVM with contact information requesting a call back.  

## 2023-06-16 ENCOUNTER — Ambulatory Visit: Payer: Self-pay | Admitting: Genetic Counselor

## 2023-06-16 ENCOUNTER — Telehealth: Payer: Self-pay | Admitting: Genetic Counselor

## 2023-06-16 ENCOUNTER — Encounter (HOSPITAL_BASED_OUTPATIENT_CLINIC_OR_DEPARTMENT_OTHER): Payer: Self-pay | Admitting: General Surgery

## 2023-06-16 DIAGNOSIS — Z170 Estrogen receptor positive status [ER+]: Secondary | ICD-10-CM

## 2023-06-16 DIAGNOSIS — C50912 Malignant neoplasm of unspecified site of left female breast: Secondary | ICD-10-CM | POA: Diagnosis not present

## 2023-06-16 DIAGNOSIS — R928 Other abnormal and inconclusive findings on diagnostic imaging of breast: Secondary | ICD-10-CM | POA: Diagnosis not present

## 2023-06-16 DIAGNOSIS — Z8042 Family history of malignant neoplasm of prostate: Secondary | ICD-10-CM

## 2023-06-16 DIAGNOSIS — Z1379 Encounter for other screening for genetic and chromosomal anomalies: Secondary | ICD-10-CM

## 2023-06-16 DIAGNOSIS — Z803 Family history of malignant neoplasm of breast: Secondary | ICD-10-CM

## 2023-06-16 MED ORDER — CHLORHEXIDINE GLUCONATE CLOTH 2 % EX PADS: 6.0000 | MEDICATED_PAD | Freq: Once | CUTANEOUS | Status: DC

## 2023-06-16 NOTE — Telephone Encounter (Signed)
Disclosed negative genetics.    

## 2023-06-16 NOTE — Progress Notes (Signed)
      Enhanced Recovery after Surgery Enhanced Recovery after Surgery is a protocol used to improve the stress on your body and your recovery after surgery.  Patient Instructions  The night before surgery:  No food after midnight. ONLY clear liquids after midnight  The day of surgery (if you do NOT have diabetes):  Drink ONE (1) Pre-Surgery Clear Ensure as directed.   This drink was given to you during your hospital  pre-op appointment visit. The pre-op nurse will instruct you on the time to drink the  Pre-Surgery Ensure depending on your surgery time. Finish the drink at the designated time by the pre-op nurse.  Nothing else to drink after completing the  Pre-Surgery Clear Ensure.  The day of surgery (if you have diabetes): Drink ONE (1) Gatorade 2 (G2) as directed. This drink was given to you during your hospital  pre-op appointment visit.  The pre-op nurse will instruct you on the time to drink the   Gatorade 2 (G2) depending on your surgery time. Color of the Gatorade may vary. Red is not allowed. Nothing else to drink after completing the  Gatorade 2 (G2).  Surgical soap given with written instructions         If you have questions, please contact your surgeon's office.

## 2023-06-16 NOTE — Anesthesia Preprocedure Evaluation (Signed)
Anesthesia Evaluation  Patient identified by MRN, date of birth, ID band Patient awake    Reviewed: Allergy & Precautions, NPO status , Patient's Chart, lab work & pertinent test results  Airway Mallampati: II  TM Distance: >3 FB Neck ROM: Full    Dental no notable dental hx. (+) Teeth Intact, Dental Advisory Given   Pulmonary former smoker   Pulmonary exam normal breath sounds clear to auscultation       Cardiovascular negative cardio ROS Normal cardiovascular exam Rhythm:Regular Rate:Normal  05/2022 Echo  Left Ventricle: Left ventricular ejection fraction, by estimation, is 55  to 60%. The left ventricle has normal function. The left ventricle has no  regional wall motion abnormalities. The left ventricular internal cavity  size was normal in size. There is   no left ventricular hypertrophy. Left ventricular diastolic parameters  were normal.      Neuro/Psych  Neuromuscular disease (tethered spinal cord, neurogenic bladder)    GI/Hepatic hiatal hernia,GERD  Medicated and Controlled,,Colon adenoma   Endo/Other    Renal/GU      Musculoskeletal   Abdominal   Peds  Hematology Lab Results      Component                Value               Date                      WBC                      6.5                 06/02/2023                HGB                      13.7                06/02/2023                HCT                      42.4                06/02/2023                MCV                      94.2                06/02/2023                PLT                      178                 06/02/2023           Factor V leiden   Anesthesia Other Findings L Breast CA  All: tramadol, ketoprofen,oxycodone, venefaxine, macdodantin  Reproductive/Obstetrics                             Anesthesia Physical Anesthesia Plan  ASA: 2  Anesthesia Plan: General   Post-op Pain Management: Precedex and  Tylenol PO (pre-op)*   Induction: Intravenous  PONV Risk Score and Plan: 4  or greater and Propofol infusion, TIVA, Treatment may vary due to age or medical condition and Ondansetron  Airway Management Planned: LMA  Additional Equipment: None  Intra-op Plan:   Post-operative Plan: Extubation in OR  Informed Consent: I have reviewed the patients History and Physical, chart, labs and discussed the procedure including the risks, benefits and alternatives for the proposed anesthesia with the patient or authorized representative who has indicated his/her understanding and acceptance.     Dental advisory given  Plan Discussed with: CRNA  Anesthesia Plan Comments:         Anesthesia Quick Evaluation

## 2023-06-17 ENCOUNTER — Ambulatory Visit (HOSPITAL_BASED_OUTPATIENT_CLINIC_OR_DEPARTMENT_OTHER): Payer: PPO | Admitting: Certified Registered Nurse Anesthetist

## 2023-06-17 ENCOUNTER — Ambulatory Visit (HOSPITAL_BASED_OUTPATIENT_CLINIC_OR_DEPARTMENT_OTHER): Payer: Self-pay | Admitting: Certified Registered Nurse Anesthetist

## 2023-06-17 ENCOUNTER — Ambulatory Visit (HOSPITAL_BASED_OUTPATIENT_CLINIC_OR_DEPARTMENT_OTHER)
Admission: RE | Admit: 2023-06-17 | Discharge: 2023-06-17 | Disposition: A | Discharge status: Home / Self Care | Payer: PPO | Attending: General Surgery | Admitting: General Surgery

## 2023-06-17 ENCOUNTER — Other Ambulatory Visit: Payer: Self-pay

## 2023-06-17 ENCOUNTER — Encounter (HOSPITAL_BASED_OUTPATIENT_CLINIC_OR_DEPARTMENT_OTHER)
Admission: RE | Disposition: A | Discharge status: Home / Self Care | Payer: Self-pay | Source: Home / Self Care | Attending: General Surgery

## 2023-06-17 ENCOUNTER — Encounter (HOSPITAL_BASED_OUTPATIENT_CLINIC_OR_DEPARTMENT_OTHER): Payer: Self-pay | Admitting: General Surgery

## 2023-06-17 DIAGNOSIS — N319 Neuromuscular dysfunction of bladder, unspecified: Secondary | ICD-10-CM | POA: Diagnosis not present

## 2023-06-17 DIAGNOSIS — C50412 Malignant neoplasm of upper-outer quadrant of left female breast: Secondary | ICD-10-CM | POA: Diagnosis not present

## 2023-06-17 DIAGNOSIS — Z79899 Other long term (current) drug therapy: Secondary | ICD-10-CM | POA: Insufficient documentation

## 2023-06-17 DIAGNOSIS — Z170 Estrogen receptor positive status [ER+]: Secondary | ICD-10-CM | POA: Diagnosis not present

## 2023-06-17 DIAGNOSIS — Z803 Family history of malignant neoplasm of breast: Secondary | ICD-10-CM | POA: Diagnosis not present

## 2023-06-17 DIAGNOSIS — K219 Gastro-esophageal reflux disease without esophagitis: Secondary | ICD-10-CM | POA: Insufficient documentation

## 2023-06-17 DIAGNOSIS — D6851 Activated protein C resistance: Secondary | ICD-10-CM | POA: Diagnosis not present

## 2023-06-17 DIAGNOSIS — Z87891 Personal history of nicotine dependence: Secondary | ICD-10-CM | POA: Insufficient documentation

## 2023-06-17 DIAGNOSIS — K449 Diaphragmatic hernia without obstruction or gangrene: Secondary | ICD-10-CM | POA: Diagnosis not present

## 2023-06-17 DIAGNOSIS — C50912 Malignant neoplasm of unspecified site of left female breast: Secondary | ICD-10-CM | POA: Diagnosis not present

## 2023-06-17 HISTORY — PX: BREAST LUMPECTOMY WITH RADIOACTIVE SEED LOCALIZATION: SHX6424

## 2023-06-17 SURGERY — BREAST LUMPECTOMY WITH RADIOACTIVE SEED LOCALIZATION
Anesthesia: General | Site: Breast | Laterality: Left

## 2023-06-17 MED ORDER — FENTANYL CITRATE (PF) 100 MCG/2ML IJ SOLN
INTRAMUSCULAR | Status: DC | PRN
  Administered 2023-06-17 (×2): 25 ug via INTRAVENOUS

## 2023-06-17 MED ORDER — ONDANSETRON HCL 4 MG/2ML IJ SOLN: 4.0000 mg | Freq: Once | INTRAMUSCULAR | Status: DC | PRN

## 2023-06-17 MED ORDER — FENTANYL CITRATE (PF) 100 MCG/2ML IJ SOLN
INTRAMUSCULAR | Status: AC
  Filled 2023-06-17: qty 2

## 2023-06-17 MED ORDER — GABAPENTIN 100 MG PO CAPS
ORAL_CAPSULE | ORAL | Status: AC
  Filled 2023-06-17: qty 1

## 2023-06-17 MED ORDER — PHENYLEPHRINE 80 MCG/ML (10ML) SYRINGE FOR IV PUSH (FOR BLOOD PRESSURE SUPPORT)
PREFILLED_SYRINGE | INTRAVENOUS | Status: AC
  Filled 2023-06-17: qty 10

## 2023-06-17 MED ORDER — PROPOFOL 10 MG/ML IV BOLUS
INTRAVENOUS | Status: AC
  Filled 2023-06-17: qty 20

## 2023-06-17 MED ORDER — LACTATED RINGERS IV SOLN: INTRAVENOUS | Status: DC

## 2023-06-17 MED ORDER — DEXAMETHASONE SODIUM PHOSPHATE 10 MG/ML IJ SOLN
INTRAMUSCULAR | Status: AC
  Filled 2023-06-17: qty 1

## 2023-06-17 MED ORDER — ONDANSETRON HCL 4 MG/2ML IJ SOLN
INTRAMUSCULAR | Status: AC
  Filled 2023-06-17: qty 2

## 2023-06-17 MED ORDER — DEXMEDETOMIDINE HCL IN NACL 80 MCG/20ML IV SOLN
INTRAVENOUS | Status: DC | PRN
Start: 2023-06-17 — End: 2023-06-17
  Administered 2023-06-17: 4 ug via INTRAVENOUS

## 2023-06-17 MED ORDER — ONDANSETRON HCL 4 MG/2ML IJ SOLN
INTRAMUSCULAR | Status: DC | PRN
  Administered 2023-06-17: 4 mg via INTRAVENOUS

## 2023-06-17 MED ORDER — ACETAMINOPHEN 10 MG/ML IV SOLN: 1000.0000 mg | Freq: Once | INTRAVENOUS | Status: DC | PRN

## 2023-06-17 MED ORDER — 0.9 % SODIUM CHLORIDE (POUR BTL) OPTIME
TOPICAL | Status: DC | PRN
  Administered 2023-06-17: 1000 mL

## 2023-06-17 MED ORDER — CEFAZOLIN SODIUM-DEXTROSE 2-4 GM/100ML-% IV SOLN
2.0000 g | INTRAVENOUS | Status: AC
  Administered 2023-06-17: 2 g via INTRAVENOUS

## 2023-06-17 MED ORDER — PROPOFOL 10 MG/ML IV BOLUS
INTRAVENOUS | Status: DC | PRN
  Administered 2023-06-17: 200 mg via INTRAVENOUS

## 2023-06-17 MED ORDER — LIDOCAINE 2% (20 MG/ML) 5 ML SYRINGE
INTRAMUSCULAR | Status: AC
  Filled 2023-06-17: qty 5

## 2023-06-17 MED ORDER — MIDAZOLAM HCL 2 MG/2ML IJ SOLN
INTRAMUSCULAR | Status: AC
  Filled 2023-06-17: qty 2

## 2023-06-17 MED ORDER — HYDROCODONE-ACETAMINOPHEN 5-325 MG PO TABS: 1.0000 | ORAL_TABLET | Freq: Four times a day (QID) | ORAL | 0 refills | Status: DC | PRN

## 2023-06-17 MED ORDER — PHENYLEPHRINE HCL (PRESSORS) 10 MG/ML IV SOLN
INTRAVENOUS | Status: DC | PRN
  Administered 2023-06-17: 80 ug via INTRAVENOUS
  Administered 2023-06-17 (×3): 160 ug via INTRAVENOUS

## 2023-06-17 MED ORDER — CEFAZOLIN SODIUM-DEXTROSE 2-4 GM/100ML-% IV SOLN
INTRAVENOUS | Status: AC
  Filled 2023-06-17: qty 100

## 2023-06-17 MED ORDER — PROPOFOL 500 MG/50ML IV EMUL
INTRAVENOUS | Status: DC | PRN
  Administered 2023-06-17: 125 ug/kg/min via INTRAVENOUS

## 2023-06-17 MED ORDER — BUPIVACAINE-EPINEPHRINE (PF) 0.25% -1:200000 IJ SOLN
INTRAMUSCULAR | Status: AC
  Filled 2023-06-17: qty 30

## 2023-06-17 MED ORDER — BUPIVACAINE-EPINEPHRINE (PF) 0.25% -1:200000 IJ SOLN
INTRAMUSCULAR | Status: DC | PRN
  Administered 2023-06-17: 19 mL

## 2023-06-17 MED ORDER — LIDOCAINE HCL (CARDIAC) PF 100 MG/5ML IV SOSY
PREFILLED_SYRINGE | INTRAVENOUS | Status: DC | PRN
  Administered 2023-06-17: 100 mg via INTRAVENOUS

## 2023-06-17 MED ORDER — ACETAMINOPHEN 500 MG PO TABS
ORAL_TABLET | ORAL | Status: AC
  Filled 2023-06-17: qty 2

## 2023-06-17 MED ORDER — FENTANYL CITRATE (PF) 100 MCG/2ML IJ SOLN
25.0000 ug | INTRAMUSCULAR | Status: DC | PRN
  Administered 2023-06-17: 50 ug via INTRAVENOUS

## 2023-06-17 MED ORDER — ACETAMINOPHEN 500 MG PO TABS
1000.0000 mg | ORAL_TABLET | ORAL | Status: AC
  Administered 2023-06-17: 1000 mg via ORAL

## 2023-06-17 MED ORDER — GABAPENTIN 100 MG PO CAPS
100.0000 mg | ORAL_CAPSULE | ORAL | Status: AC
  Administered 2023-06-17: 100 mg via ORAL

## 2023-06-17 MED ORDER — DEXAMETHASONE SODIUM PHOSPHATE 4 MG/ML IJ SOLN
INTRAMUSCULAR | Status: DC | PRN
  Administered 2023-06-17: 5 mg via INTRAVENOUS

## 2023-06-17 SURGICAL SUPPLY — 41 items
ADH SKN CLS APL DERMABOND .7 (GAUZE/BANDAGES/DRESSINGS) ×1
APL PRP STRL LF DISP 70% ISPRP (MISCELLANEOUS) ×1
APPLIER CLIP 9.375 MED OPEN (MISCELLANEOUS) ×1
APR CLP MED 9.3 20 MLT OPN (MISCELLANEOUS) ×1
BINDER BREAST MEDIUM (GAUZE/BANDAGES/DRESSINGS) IMPLANT
BLADE SURG 15 STRL LF DISP TIS (BLADE) ×1 IMPLANT
BLADE SURG 15 STRL SS (BLADE) ×1
CANISTER SUC SOCK COL 7IN (MISCELLANEOUS) ×1 IMPLANT
CANISTER SUCT 1200ML W/VALVE (MISCELLANEOUS) ×1 IMPLANT
CHLORAPREP W/TINT 26 (MISCELLANEOUS) ×1 IMPLANT
CLIP APPLIE 9.375 MED OPEN (MISCELLANEOUS) IMPLANT
COVER BACK TABLE 60X90IN (DRAPES) ×1 IMPLANT
COVER MAYO STAND STRL (DRAPES) ×1 IMPLANT
COVER PROBE CYLINDRICAL 5X96 (MISCELLANEOUS) ×1 IMPLANT
DERMABOND ADVANCED .7 DNX12 (GAUZE/BANDAGES/DRESSINGS) ×1 IMPLANT
DRAPE LAPAROSCOPIC ABDOMINAL (DRAPES) ×1 IMPLANT
DRAPE UTILITY XL STRL (DRAPES) ×1 IMPLANT
ELECT COATED BLADE 2.86 ST (ELECTRODE) ×1 IMPLANT
ELECT REM PT RETURN 9FT ADLT (ELECTROSURGICAL) ×1
ELECTRODE REM PT RTRN 9FT ADLT (ELECTROSURGICAL) ×1 IMPLANT
GAUZE PAD ABD 8X10 STRL (GAUZE/BANDAGES/DRESSINGS) IMPLANT
GLOVE BIO SURGEON STRL SZ7.5 (GLOVE) ×2 IMPLANT
GOWN STRL REUS W/ TWL LRG LVL3 (GOWN DISPOSABLE) ×2 IMPLANT
GOWN STRL REUS W/TWL LRG LVL3 (GOWN DISPOSABLE) ×2
KIT MARKER MARGIN INK (KITS) ×1 IMPLANT
NDL HYPO 25X1 1.5 SAFETY (NEEDLE) IMPLANT
NEEDLE HYPO 25X1 1.5 SAFETY (NEEDLE) ×1
NS IRRIG 1000ML POUR BTL (IV SOLUTION) IMPLANT
PACK BASIN DAY SURGERY FS (CUSTOM PROCEDURE TRAY) ×1 IMPLANT
PENCIL SMOKE EVACUATOR (MISCELLANEOUS) ×1 IMPLANT
SLEEVE SCD COMPRESS KNEE MED (STOCKING) ×1 IMPLANT
SPIKE FLUID TRANSFER (MISCELLANEOUS) IMPLANT
SPONGE T-LAP 18X18 ~~LOC~~+RFID (SPONGE) ×1 IMPLANT
SUT MON AB 4-0 PC3 18 (SUTURE) ×1 IMPLANT
SUT SILK 2 0 SH (SUTURE) IMPLANT
SUT VICRYL 3-0 CR8 SH (SUTURE) ×1 IMPLANT
SYR CONTROL 10ML LL (SYRINGE) IMPLANT
TOWEL GREEN STERILE FF (TOWEL DISPOSABLE) ×1 IMPLANT
TRAY FAXITRON CT DISP (TRAY / TRAY PROCEDURE) ×1 IMPLANT
TUBE CONNECTING 20X1/4 (TUBING) ×1 IMPLANT
YANKAUER SUCT BULB TIP NO VENT (SUCTIONS) IMPLANT

## 2023-06-17 NOTE — Interval H&P Note (Signed)
History and Physical Interval Note:  06/17/2023 7:38 AM  Regina [name]  has presented today for surgery, with the diagnosis of LEFT BREAST CANCER AND COMPLEX SCLEROSING LESION.  The various methods of treatment have been discussed with the patient and family. After consideration of risks, benefits and other options for treatment, the patient has consented to  Procedure(s): LEFT BREAST RADIOACTIVE SEED LOCALIZED LUMPECTOMY x2 (Left) as a surgical intervention.  The patient's history has been reviewed, patient examined, no change in status, stable for surgery.  I have reviewed the patient's chart and labs.  Questions were answered to the patient's satisfaction.     [name]

## 2023-06-17 NOTE — Discharge Instructions (Signed)
No Tylenol until after 1:30pm today, if needed.   Post Anesthesia Home Care Instructions  Activity: Get plenty of rest for the remainder of the day. A responsible individual must stay with you for 24 hours following the procedure.  For the next 24 hours, DO NOT: -Drive a car -Advertising copywriter -Drink alcoholic beverages -Take any medication unless instructed by your physician -Make any legal decisions or sign important papers.  Meals: Start with liquid foods such as gelatin or soup. Progress to regular foods as tolerated. Avoid greasy, spicy, heavy foods. If nausea and/or vomiting occur, drink only clear liquids until the nausea and/or vomiting subsides. Call your physician if vomiting continues.  Special Instructions/Symptoms: Your throat may feel dry or sore from the anesthesia or the breathing tube placed in your throat during surgery. If this causes discomfort, gargle with warm salt water. The discomfort should disappear within 24 hours.  If you had a scopolamine patch placed behind your ear for the management of post- operative nausea and/or vomiting:  1. The medication in the patch is effective for 72 hours, after which it should be removed.  Wrap patch in a tissue and discard in the trash. Wash hands thoroughly with soap and water. 2. You may remove the patch earlier than 72 hours if you experience unpleasant side effects which may include dry mouth, dizziness or visual disturbances. 3. Avoid touching the patch. Wash your hands with soap and water after contact with the patch.

## 2023-06-17 NOTE — H&P (Signed)
REFERRING PHYSICIAN: Sabas Sous, MD PROVIDER: Lindell Noe, MD MRN: [id number] DOB: 1941/09/11 Subjective   Chief Complaint: Breast Cancer  History of Present Illness: Regina [name] is a 82 y.o. female who is seen today as an office consultation for evaluation of Breast Cancer  We are asked to see the patient in consultation by Dr. Pamelia Hoit to evaluate her for a new left breast cancer. The patient is a 82 year old white female who recently went for a routine screening mammogram. At that time she was found to have a 2.6 cm mass in the upper outer quadrant of the left breast. The axilla looked normal. The mass was biopsied and came back as a grade 2 invasive ductal cancer that was ER positive and PR negative and HER2 negative with a Ki-67 of 30%. She does have a history of complex sclerosing in the same area that has not changed. She elected for observation 3 years ago. Her only other significant history is for a tethered spinal cord. She quit smoking in 1974. She does have a history of breast cancer in a paternal grandmother  Review of Systems: A complete review of systems was obtained from the patient. I have reviewed this information and discussed as appropriate with the patient. See HPI as well for other ROS.  ROS   Medical History: Past Medical History:  Diagnosis Date  Arthritis  Asthma, unspecified asthma severity, unspecified whether complicated, unspecified whether persistent (HHS-HCC)  DVT (deep venous thrombosis) (CMS/HHS-HCC)  GERD (gastroesophageal reflux disease)  History of cancer  Reflux  Tethered cord (CMS/HHS-HCC)   Patient Active Problem List  Diagnosis  Lipoma  Malignant neoplasm of upper-outer quadrant of left breast in female, estrogen receptor positive (CMS/HHS-HCC)   Past Surgical History:  Procedure Laterality Date  HYSTERECTOMY  LAPAROSCOPIC SALPINGOOPHERECTOMY  LAPAROSCOPIC TUBAL LIGATION    Allergies  Allergen Reactions  Ketoprofen  Unknown  Oxycodone Itching  Tramadol Itching  Venlafaxine Analogues Itching   Current Outpatient Medications on File Prior to Visit  Medication Sig Dispense Refill  atorvastatin (LIPITOR) 10 MG tablet Take 10 mg by mouth once daily  calcium citrate-vitamin D3 (CITRACAL+D) 315 mg-5 mcg (200 unit) tablet Take 2 tablets by mouth  famotidine (PEPCID) 20 MG tablet  loratadine (CLARITIN) 10 mg tablet Take 10 mg by mouth once daily  mirabegron (MYRBETRIQ) 50 mg ER tablet Take 50 mg by mouth once daily  montelukast (SINGULAIR) 10 mg tablet Take 10 mg by mouth at bedtime  nitroGLYcerin (NITROSTAT) 0.4 MG SL tablet Place under the tongue  estradiol (CLIMARA) 0.025 mg/24 hr patch 0.025 patches.  gabapentin (NEURONTIN) 300 MG capsule Take 1 capsule (300 mg total) by mouth nightly. 30 capsule 4  LORazepam (ATIVAN) 0.5 MG tablet Take 0.5 mg by mouth every 8 (eight) hours as needed for Anxiety.  omeprazole (PRILOSEC) 40 MG DR capsule Take 40 mg by mouth daily.   No current facility-administered medications on file prior to visit.   Family History  Problem Relation Age of Onset  Stroke Mother  High blood pressure (Hypertension) Mother  Transient ischemic attack Mother  Skin cancer Father  Heart disease Father  Myocardial Infarction (Heart attack) Father  Skin cancer Brother    Social History   Tobacco Use  Smoking Status Former  Smokeless Tobacco Never    Social History   Socioeconomic History  Marital status: Married  Tobacco Use  Smoking status: Former  Smokeless tobacco: Never  Substance and Sexual Activity  Alcohol use: Yes  Drug use: No  Objective:   Vitals:  PainSc: 0-No pain   There is no height or weight on file to calculate BMI.  Physical Exam Vitals reviewed.  Constitutional:  General: She is not in acute distress. Appearance: Normal appearance.  HENT:  Head: Normocephalic and atraumatic.  Right Ear: External ear normal.  Left Ear: External ear normal.   Nose: Nose normal.  Mouth/Throat:  Mouth: Mucous membranes are moist.  Pharynx: Oropharynx is clear.  Eyes:  General: No scleral icterus. Extraocular Movements: Extraocular movements intact.  Conjunctiva/sclera: Conjunctivae normal.  Pupils: Pupils are equal, round, and reactive to light.  Cardiovascular:  Rate and Rhythm: Normal rate and regular rhythm.  Pulses: Normal pulses.  Heart sounds: Normal heart sounds.  Pulmonary:  Effort: Pulmonary effort is normal. No respiratory distress.  Breath sounds: Normal breath sounds.  Abdominal:  General: Bowel sounds are normal.  Palpations: Abdomen is soft.  Tenderness: There is no abdominal tenderness.  Musculoskeletal:  General: No swelling, tenderness or deformity. Normal range of motion.  Cervical back: Normal range of motion and neck supple.  Skin: General: Skin is warm and dry.  Coloration: Skin is not jaundiced.  Neurological:  General: No focal deficit present.  Mental Status: She is alert and oriented to person, place, and time.  Psychiatric:  Mood and Affect: Mood normal.  Behavior: Behavior normal.     Breast: There is no palpable mass in either breast. There is no palpable axillary, supraclavicular, or cervical lymphadenopathy.  Labs, Imaging and Diagnostic Testing:  Assessment and Plan:   Diagnoses and all orders for this visit:  Malignant neoplasm of upper-outer quadrant of left breast in female, estrogen receptor positive (CMS/HHS-HCC) - [facility] Case Posting Request; Future   The patient appears to have a 2.6 cm cancer in the upper outer quadrant of the left breast with clinically negative nodes. I have discussed with her in detail the different options for treatment and at this point she favors breast conservation which I feel is very reasonable. Given the size and markers she will not need a node evaluation. Since the complex sclerosing lesion is in the same area we would plan to remove both at the same time. I  have discussed with her in detail the risks and benefits of the operation as well as some of the technical aspects including use of radioactive seeds for localization and she understands and wishes to proceed. She will also meet with medical and radiation oncology to discuss adjuvant therapy.

## 2023-06-17 NOTE — Anesthesia Postprocedure Evaluation (Signed)
Anesthesia Post Note  Patient: Regina [name]  Procedure(s) Performed: LEFT BREAST RADIOACTIVE SEED LOCALIZED LUMPECTOMY x2 (Left: Breast)     Patient location during evaluation: PACU Anesthesia Type: General Level of consciousness: awake and alert Pain management: pain level controlled Vital Signs Assessment: post-procedure vital signs reviewed and stable Respiratory status: spontaneous breathing, nonlabored ventilation, respiratory function stable and patient connected to nasal cannula oxygen Cardiovascular status: blood pressure returned to baseline and stable Postop Assessment: no apparent nausea or vomiting Anesthetic complications: no   No notable events documented.  Last Vitals:  Vitals:   06/17/23 1015 06/17/23 1044  BP: 102/66 122/71  Pulse: 72 66  Resp: 12 16  Temp:  36.8 C  SpO2: 93% 98%    Last Pain:  Vitals:   06/17/23 1044  TempSrc: Temporal  PainSc: 0-No pain                 Trevor Iha

## 2023-06-17 NOTE — Op Note (Signed)
06/17/2023  9:48 AM  PATIENT:  Regina [name]  82 y.o. female  PRE-OPERATIVE DIAGNOSIS:  LEFT BREAST CANCER AND COMPLEX SCLEROSING LESION  POST-OPERATIVE DIAGNOSIS:  LEFT BREAST CANCER AND COMPLEX SCLEROSING LESION  PROCEDURE:  Procedure(s) with comments: LEFT BREAST RADIOACTIVE SEED LOCALIZED LUMPECTOMY x2 (Left) - one seed  SURGEON:  Surgeons and Role:    * Griselda Miner, MD - Primary  PHYSICIAN ASSISTANT:   ASSISTANTS: none   ANESTHESIA:   local and general  EBL:  30 mL   BLOOD ADMINISTERED:none  DRAINS: none   LOCAL MEDICATIONS USED:  MARCAINE     SPECIMEN:  Source of Specimen:  left breast tissue with additional lateral and anterior margins  DISPOSITION OF SPECIMEN:  PATHOLOGY  COUNTS:  YES  TOURNIQUET:  * No tourniquets in log *  DICTATION: .Dragon Dictation  After informed consent was obtained the patient was brought to the operating room and placed in the supine position on the operating table.  After adequate induction of general anesthesia the patient's left breast was prepped with ChloraPrep, allowed to dry, and draped in usual sterile manner.  An appropriate timeout was performed.  Previously an I-125 seed was placed in the upper outer quadrant of the left breast to mark an area of invasive breast cancer.  There was also a complex sclerosing lesion about 2 cm medial to this.  The neoprobe was set to I-125 in the area of radioactivity was readily identified.  The area around this was infiltrated with quarter percent Marcaine.  I elected to make a curvilinear incision along the outer aspect of the breast close to where the radioactivity was.  The incision was carried through the skin and subcutaneous tissue sharply with the electrocautery.  Dissection was then carried around the radioactive seed while checking the area of radioactivity frequently.  This dissection was carried all the way to the muscle of the chest wall.  Once the tissue was removed it was oriented  with the appropriate paint colors.  A specimen radiograph was obtained that showed the 2 clips and 1 seed to be within the specimen.  I did elect to take an additional lateral and anterior margin and these were also marked appropriately.  All of the tissue was sent to pathology for further evaluation.  Hemostasis was achieved using the Bovie electrocautery.  The wound was irrigated with saline and infiltrated with more quarter percent Marcaine.  The cavity was marked with clips.  The deep layer of the wound was then closed with layers of interrupted 3-0 Vicryl stitches.  The skin was then closed with a running 4-0 Monocryl subcuticular stitch.  Dermabond dressings were applied.  The patient tolerated the procedure well.  At the end of the case all needle sponge and instrument counts were correct.  The patient was then awakened and taken to recovery in stable condition.  PLAN OF CARE: Discharge to home after PACU  PATIENT DISPOSITION:  PACU - hemodynamically stable.   Delay start of Pharmacological VTE agent (>24hrs) due to surgical blood loss or risk of bleeding: not applicable

## 2023-06-17 NOTE — Transfer of Care (Signed)
Immediate Anesthesia Transfer of Care Note  Patient: Regina [name]  Procedure(s) Performed: LEFT BREAST RADIOACTIVE SEED LOCALIZED LUMPECTOMY x2 (Left: Breast)  Patient Location: PACU  Anesthesia Type:General  Level of Consciousness: awake, alert , and oriented  Airway & Oxygen Therapy: Patient Spontanous Breathing and Patient connected to face mask oxygen  Post-op Assessment: Report given to RN and Post -op Vital signs reviewed and stable  Post vital signs: Reviewed and stable  Last Vitals:  Vitals Value Taken Time  BP 103/65 06/17/23 0950  Temp    Pulse 81 06/17/23 0951  Resp 14 06/17/23 0951  SpO2 100 % 06/17/23 0951  Vitals shown include unfiled device data.  Last Pain:  Vitals:   06/17/23 0729  TempSrc: Oral  PainSc:          Complications: No notable events documented.

## 2023-06-17 NOTE — Anesthesia Procedure Notes (Signed)
Procedure Name: LMA Insertion Date/Time: 06/17/2023 8:37 AM  Performed by: Cleda Clarks, CRNAPre-anesthesia Checklist: Patient identified, Emergency Drugs available, Suction available and Patient being monitored Patient Re-evaluated:Patient Re-evaluated prior to induction Oxygen Delivery Method: Circle system utilized Preoxygenation: Pre-oxygenation with 100% oxygen Induction Type: IV induction Ventilation: Mask ventilation without difficulty LMA: LMA inserted LMA Size: 3.0 Number of attempts: 1 Placement Confirmation: positive ETCO2 Tube secured with: Tape Dental Injury: Teeth and Oropharynx as per pre-operative assessment

## 2023-06-18 ENCOUNTER — Encounter (HOSPITAL_BASED_OUTPATIENT_CLINIC_OR_DEPARTMENT_OTHER): Payer: Self-pay | Admitting: General Surgery

## 2023-06-18 ENCOUNTER — Encounter: Payer: Self-pay | Admitting: General Practice

## 2023-06-18 NOTE — Progress Notes (Signed)
[facility] Spiritual Care Note  Connected with Regina [name] on a [facility] follow-up call. She was in wonderful spirits, saying that she feels great after her procedure and is already thinking about how to encourage others who may be in more difficult positions with their own diagnoses and treatment.   She reports no needs at this time, but is aware of ongoing Spiritual Care availability in case needs arise or circumstances change.   [address] [place], [place], [facility] Pager [phone] Voicemail [phone]

## 2023-06-20 NOTE — Progress Notes (Unsigned)
Cardiology Office Note:    Date:  06/20/2023   ID:  Regina, [name] Jun 13, 1941, MRN [id number]  PCP:  [name], MD  Cardiologist:  None  Electrophysiologist:  None   Referring MD: [name], MD   No chief complaint on file.   History of Present Illness:    Regina [name] is a 82 y.o. female with a hx of factor V Leiden who presents for follow-up.  She was seen in the ED on 05/01/2022 with palpitations.  Labs notable for troponin 22 > 19.  She reports a single episode of palpitations that occurred the night she went to the ED.  Reports felt like heart was racing, lasted 30 minutes.  Reports since that time she has been having chest discomfort that she describes as dull aching pain on left side of chest.  She denies any dyspnea, lightheadedness, syncope, lower extremity edema.  She does tai chi twice per week for 1 hour.  She smoked almost 3 packs/day x 5 years, quit in 1974.  Family history includes mother had TIA in the 73s, maternal grandmother died of MI in 23s.  Echocardiogram 05/14/2022 showed normal biventricular function, no significant valvular disease.  Coronary CTA on 06/18/2022 showed minimal CAD, calcium score 6 (22nd percentile).  Zio patch x14 days on 06/01/2022 showed 18 episodes of SVT, longest lasting 18 beats.  Since last clinic visit, Denies any chest pain, dyspnea, lightheadedness, syncope, lower extremity edema, or palpitations.    she reports she is doing well.  Denies any chest pain or dyspnea.  No further palpitations.  Does report has been having issues with chronic cough.  Also reports leg numbness/discoloration. Denies any chest pain, dyspnea.  Chronic cough. No palpitations.   Past Medical History:  Diagnosis Date   Breast cancer (HCC)    Diverticulosis    GERD (gastroesophageal reflux disease)    Headache    Heterozygous factor V Leiden mutation (HCC)    Hiatal hernia    Insomnia    Nervous system disorder    spinal cord tethered    Neurogenic bladder    Osteoporosis    Osteoporosis    Tubular adenoma of colon    Urinary incontinence     Past Surgical History:  Procedure Laterality Date   61 HOUR PH STUDY N/A 03/24/2023   Procedure: 24 HOUR PH STUDY;  Surgeon: Beverley Fiedler, MD;  Location: [facility];  Service: Gastroenterology;  Laterality: N/A;   BREAST LUMPECTOMY WITH RADIOACTIVE SEED LOCALIZATION Left 06/17/2023   Procedure: LEFT BREAST RADIOACTIVE SEED LOCALIZED LUMPECTOMY x2;  Surgeon: Griselda Miner, MD;  Location: [facility];  Service: General;  Laterality: Left;  one seed   dental implants     ESOPHAGEAL MANOMETRY N/A 03/24/2023   Procedure: ESOPHAGEAL MANOMETRY (EM);  Surgeon: Beverley Fiedler, MD;  Location: [facility];  Service: Gastroenterology;  Laterality: N/A;   PARTIAL HYSTERECTOMY  10/12/1994   SALPINGOOPHORECTOMY Right    TUBAL LIGATION      Current Medications: No outpatient medications have been marked as taking for the 06/21/23 encounter (Appointment) with [name], MD.     Allergies:   Ketoprofen, Tramadol, Macrodantin [nitrofurantoin macrocrystal], Oxycodone, and Venlafaxine   Social History   Socioeconomic History   Marital status: Married    Spouse name: John   Number of children: 0   Years of education: Some colle   Highest education level: Not on file  Occupational History   Occupation: Self employed  Occupation: OWNER OF Leisure centre manager: [employer]  Tobacco Use   Smoking status: Former    Current packs/day: 0.00    Average packs/day: 3.0 packs/day for 4.0 years (12.0 ttl pk-yrs)    Types: Cigarettes    Start date: 12/10/1968    Quit date: 12/10/1972    Years since quitting: 50.5   Smokeless tobacco: Never  Substance and Sexual Activity   Alcohol use: Yes    Alcohol/week: 0.0 standard drinks of alcohol    Comment: 2-3 glasses wine/week   Drug use: No   Sexual activity: Not on file  Other Topics Concern   Not on file  Social History  Narrative   Lives w/ husband    Caffeine use: 2-3 cups coffee per day   Social Determinants of Health   Financial Resource Strain: Not on file  Food Insecurity: No Food Insecurity (06/11/2023)   Hunger Vital Sign    Worried About Running Out of Food in the Last Year: Never true    Ran Out of Food in the Last Year: Never true  Transportation Needs: No Transportation Needs (06/11/2023)   PRAPARE - Administrator, Civil Service (Medical): No    Lack of Transportation (Non-Medical): No  Physical Activity: Not on file  Stress: Not on file  Social Connections: Not on file     Family History: The patient's family history includes Asthma in her mother; Brain cancer in her maternal uncle; Breast cancer (age of onset: 63) in her paternal grandmother; CAD in her brother and father; COPD in her mother; Glaucoma in her mother; Heart attack in her father, maternal grandfather, and maternal grandmother; Hypertension in her mother; Macular degeneration in her mother; Melanoma in her brother; Osteoporosis in her mother; Prostate cancer (age of onset: 74) in her paternal grandfather; Skin cancer in her father; Stroke in her mother. There is no history of Migraines, Colon cancer, Colon polyps, or Liver disease.  ROS:   Please see the history of present illness.     All other systems reviewed and are negative.  EKGs/Labs/Other Studies Reviewed:    The following studies were reviewed today:   EKG:   05/04/22: Normal sinus rhythm, rate 69, no ST abnormalities 06/21/23: Normal sinus rhythm, rate 75, no ST abnormalities  Recent Labs: 11/26/2022: Magnesium 1.9 06/02/2023: ALT 24; BUN 17; Creatinine 0.74; Hemoglobin 13.7; Platelet Count 178; Potassium 4.4; Sodium 140  Recent Lipid Panel    Component Value Date/Time   CHOL 144 11/26/2022 0841   TRIG 56 11/26/2022 0841   HDL 70 11/26/2022 0841   CHOLHDL 2.1 11/26/2022 0841   LDLCALC 62 11/26/2022 0841    Physical Exam:    VS:  LMP  08/01/2014     Wt Readings from Last 3 Encounters:  06/17/23 122 lb 12.7 oz (55.7 kg)  06/02/23 124 lb 14.4 oz (56.7 kg)  04/30/23 122 lb (55.3 kg)     GEN: in no acute distress HEENT: Normal NECK: No JVD; No carotid bruits CARDIAC: RRR, no murmurs, rubs, gallops RESPIRATORY:  Clear to auscultation without rales, wheezing or rhonchi  ABDOMEN: Soft, non-tender, non-distended MUSCULOSKELETAL:  No edema; No deformity  SKIN: Warm and dry NEUROLOGIC:  Alert and oriented x 3 PSYCHIATRIC:  Normal affect   ASSESSMENT:    No diagnosis found.   PLAN:    Chest pain: Atypical in description.  Echocardiogram 05/14/2022 showed normal biventricular function, no significant valvular disease.  Coronary CTA on 06/18/2022 showed minimal CAD,  calcium score 6 (22nd percentile).   -Reports no further chest pain, no further work-up recommended  Palpitations: Zio patch x14 days on 06/01/2022 showed 18 episodes of SVT, longest lasting 18 beats, with no symptoms reported. -Reports palpitations have resolved  Hyperlipidemia: On atorvastatin 10 mg daily, LDL 62 on 11/26/2022  Possible esophagitis: Esophageal wall thickening seen on coronary CT, could represent esophagitis.  Referred to GI for evaluation.  Factor V Leiden: discussed referral to hematology, she wishes to hold off at this point  Leg numbness/discoloration: Normal ABIs on 07/2022  RTC in 1 year  Medication Adjustments/Labs and Tests Ordered: Current medicines are reviewed at length with the patient today.  Concerns regarding medicines are outlined above.  No orders of the defined types were placed in this encounter.  No orders of the defined types were placed in this encounter.   There are no Patient Instructions on file for this visit.   Signed, [name], MD  06/20/2023 9:41 PM    [facility]

## 2023-06-21 ENCOUNTER — Encounter: Payer: Self-pay | Admitting: Cardiology

## 2023-06-21 ENCOUNTER — Ambulatory Visit: Payer: PPO | Attending: Cardiology | Admitting: Cardiology

## 2023-06-21 VITALS — BP 124/82 | HR 75 | Ht 63.0 in | Wt 124.0 lb

## 2023-06-21 DIAGNOSIS — I2510 Atherosclerotic heart disease of native coronary artery without angina pectoris: Secondary | ICD-10-CM | POA: Diagnosis not present

## 2023-06-21 DIAGNOSIS — R600 Localized edema: Secondary | ICD-10-CM

## 2023-06-21 DIAGNOSIS — R002 Palpitations: Secondary | ICD-10-CM | POA: Diagnosis not present

## 2023-06-21 DIAGNOSIS — E785 Hyperlipidemia, unspecified: Secondary | ICD-10-CM | POA: Diagnosis not present

## 2023-06-21 LAB — SURGICAL PATHOLOGY

## 2023-06-21 NOTE — Patient Instructions (Signed)
Medication Instructions:  No Changes *If you need a refill on your cardiac medications before your next appointment, please call your pharmacy*   Lab Work: No Labs If you have labs (blood work) drawn today and your tests are completely normal, you will receive your results only by: MyChart Message (if you have MyChart) OR A paper copy in the mail If you have any lab test that is abnormal or we need to change your treatment, we will call you to review the results.   Testing/Procedures: No Testing   Follow-Up: At [facility], you and your health needs are our priority.  As part of our continuing mission to provide you with exceptional heart care, we have created designated Provider Care Teams.  These Care Teams include your primary Cardiologist (physician) and Advanced Practice Providers (APPs -  Physician Assistants and Nurse Practitioners) who all work together to provide you with the care you need, when you need it.  We recommend signing up for the patient portal called "MyChart".  Sign up information is provided on this After Visit Summary.  MyChart is used to connect with patients for Virtual Visits (Telemedicine).  Patients are able to view lab/test results, encounter notes, upcoming appointments, etc.  Non-urgent messages can be sent to your provider as well.   To learn more about what you can do with MyChart, go to [email or web address].    Your next appointment:   1 year(s)  Provider:   Epifanio Lesches, MD

## 2023-06-22 ENCOUNTER — Telehealth: Payer: Self-pay | Admitting: *Deleted

## 2023-06-22 DIAGNOSIS — M5416 Radiculopathy, lumbar region: Secondary | ICD-10-CM | POA: Diagnosis not present

## 2023-06-22 DIAGNOSIS — M6281 Muscle weakness (generalized): Secondary | ICD-10-CM | POA: Diagnosis not present

## 2023-06-22 NOTE — Telephone Encounter (Signed)
Pt left a VM late this afternoon stating " I need to know what do with this dressing".  Noted pt has breast lumpectomy on 06/17/2023.  This RN returned call to pt- she states " I just do not know what I am supposed to do - do I keep wearing the binder or what?"  " Dr Carolynne Edouard told me I could remove it and put on a tight compression sport bra - but I don't know..."  " I am just confused and don't know what I will find under there "  Pt states her d/c notes states to continue to wear compression for 6 weeks.  This RN validated her concerns- as well as she contacted the Oncology office and ideally we need to speak with the Surgical Office.  This RN advised to not do anything tonight  (now 530pm) but this RN will communicate with her surgeon's office and someone will call her back in the AM.  Pt was very appreciative of call and plan.  This note will be forwarded to Dr Carolynne Edouard and the Breast Navigators for follow up.

## 2023-06-23 ENCOUNTER — Encounter: Payer: Self-pay | Admitting: *Deleted

## 2023-06-24 NOTE — Progress Notes (Signed)
HPI:   Ms. Bier was previously seen in the [facility] due to a personal and family history of cancer and concerns regarding a hereditary predisposition to cancer.    Ms. Nydegger recent genetic test results were disclosed to her by telephone. These results and recommendations are discussed in more detail below.  CANCER HISTORY:  Oncology History  Malignant neoplasm of upper-outer quadrant of left breast in female, estrogen receptor positive (HCC)  05/19/2023 Initial Diagnosis   Screening mammogram detected left breast mass UOQ 2.2 cm (in 2021 patient had CSL: Went on surveillance), by ultrasound measured 1.6 cm at 1 o'clock position (entire area with CSL 2.6 cm), axilla negative, biopsy grade 2 IDC ER 80%, PR 0%, Ki67 30%, HER2 negative by [facility]   06/02/2023 Cancer Staging   Staging form: Breast, AJCC 8th Edition - Clinical: Stage IB (cT2, cN0, cM0, G2, ER+, PR+, HER2-) - Signed by Serena Croissant, MD on 06/02/2023 Stage prefix: Initial diagnosis Histologic grading system: 3 grade system   06/12/2023 Genetic Testing   Negative [facility] CustomNext-Cancer +RNA Panel.  Report date is 06/12/2023.   The CustomNext-Cancer+RNAinsight panel offered by [facility] includes sequencing and rearrangement analysis for the following 38 genes:  APC, ATM, BAP1, BARD1, BMPR1A, BRCA1, BRCA2, BRIP1, CDH1, CDK4, CDKN2A, CHEK2, DICER1, MLH1, MSH2, MSH6, MUTYH, NF1, NTHL1, PALB2, PMS2, POT1, PTEN, RAD51C, RAD51D, RB1, SMAD4, SMARCA4, STK11 and TP53 (sequencing and deletion/duplication); AXIN2, HOXB13, MITF, MSH3, POLD1 and POLE (sequencing only); EPCAM and GREM1 (deletion/duplication only). RNA data is routinely analyzed for use in variant interpretation for all genes.     FAMILY HISTORY:  We obtained a detailed, 4-generation family history.  Significant diagnoses are listed below:      Family History  Problem Relation Age of Onset   Skin cancer Father     Melanoma Brother          dx >50    Brain cancer Maternal Uncle          dx unknown age   Breast cancer Paternal Grandmother 50   Prostate cancer Paternal Grandfather 11     Ms. Stallone is unaware of previous family history of genetic testing for hereditary cancer risks. There is no reported Ashkenazi Jewish ancestry. There is no known consanguinity.  GENETIC TEST RESULTS:  The [facility] CancerNext +RNAinsight Panel found no pathogenic mutations.   The [facility] CancerNext+RNAinsight Panel includes sequencing, rearrangement analysis, and RNA analysis for the following 34 genes: APC, ATM, BARD1, BMPR1A, BRCA1, BRCA2, BRIP1, CDH1, CDK4, CDKN2A, CHEK2, DICER1, MLH1, MSH2, MSH6, MUTYH, NF1, NTHL1, PALB2, PMS2, PTEN, RAD51C, RAD51D, SMAD4, SMARCA4, STK11 and TP53 (sequencing and deletion/duplication); AXIN2, HOXB13, MSH3, POLD1 and POLE (sequencing only); EPCAM and GREM1 (deletion/duplication only). .   The test report has been scanned into EPIC and is located under the Molecular Pathology section of the Results Review tab.  A portion of the result report is included below for reference. Genetic testing reported out on June 12, 2023.       Even though a pathogenic variant was not identified, possible explanations for the cancer in the family may include: There may be no hereditary risk for cancer in the family. The cancers in Ms. Kratt and/or her family may be sporadic/familial or due to other genetic and environmental factors. There may be a gene mutation in one of these genes that current testing methods cannot detect but that chance is small. There could be another gene that has not yet been discovered, or that  we have not yet tested, that is responsible for the cancer diagnoses in the family.  It is also possible there is a hereditary cause for the cancer in the family that Ms. Pluta did not inherit.  Therefore, it is important to remain in touch with cancer genetics in the future so that we can continue to offer Ms. Scholze  the most up to date genetic testing.     ADDITIONAL GENETIC TESTING:   Ms. Hanak genetic testing was fairly extensive.  If there are additional relevant genes identified to increase cancer risk that can be analyzed in the future, we would be happy to discuss and coordinate this testing at that time.      CANCER SCREENING RECOMMENDATIONS:  Ms. Palmatier test result is considered negative (normal).  This means that we have not identified a hereditary cause for her personal history of breast cancer at this time.   An individual's cancer risk and medical management are not determined by genetic test results alone. Overall cancer risk assessment incorporates additional factors, including personal medical history, family history, and any available genetic information that may result in a personalized plan for cancer prevention and surveillance. Therefore, it is recommended she continue to follow the cancer management and screening guidelines provided by her oncology and primary healthcare provider.  RECOMMENDATIONS FOR FAMILY MEMBERS:   Individuals in this family might be at some increased risk of developing cancer, over the general population risk, due to the family history of cancer.  Individuals in the family should notify their providers of the family history of cancer. We recommend women in this family have a yearly mammogram beginning at age 51, or 47 years younger than the earliest onset of cancer, an annual clinical breast exam, and perform monthly breast self-exams.  Risk models that take into account family history and hormonal history may be helpful in determining appropriate breast cancer screening options for family members.  FOLLOW-UP:  Cancer genetics is a rapidly advancing field and it is possible that new genetic tests will be appropriate for her and/or her family members in the future. We encourage Ms. Schupp to remain in contact with cancer genetics, so we can update her personal  and family histories and let her know of advances in cancer genetics that may benefit this family.   Our contact number was provided.  They are welcome to call us at anytime with additional questions or concerns.   Shron Ozer M. Rennie Plowman, MS, [facility] Genetic Counselor [email or web address] (P) [phone]

## 2023-06-25 DIAGNOSIS — M6281 Muscle weakness (generalized): Secondary | ICD-10-CM | POA: Diagnosis not present

## 2023-06-25 DIAGNOSIS — M5416 Radiculopathy, lumbar region: Secondary | ICD-10-CM | POA: Diagnosis not present

## 2023-06-30 NOTE — Progress Notes (Signed)
Location of Breast Cancer: Malignant neoplasm of upper-outer quadrant of left breast in female, estrogen receptor positive   Histology per Pathology Report:  FINAL MICROSCOPIC DIAGNOSIS:  A. BREAST, LEFT, LUMPECTOMY:      Invasive ductal carcinoma, 1.8 cm, grade 3 Ductal carcinoma in situ:  Present, solid type, intermediate to high grade Margins, invasive:  Negative           Closest, invasive:  1.5 mm from the posterior margin Margins, DCIS:  Negative           Closest, DCIS:  4 mm from the posterior margin Lymphovascular invasion:  Not identified Biopsy site and clips (Heart shaped and dumbbell shaped): Identified Prognostic markers: ER: 80%, positive, moderately to strong staining intensity PR: 0%, negative Her2: Negative by FISH Ki-67: 30% Other: Fibrocystic changes with stromal fibrosis, adenosis, microcysts, columnar cell             change, usual ductal hyperplasia and calcifications. See oncology table  B. BREAST, LEFT ADDITIONAL LATERAL MARGIN, EXCISION:      Benign breast tissue with adenosis.      Negative for atypia or malignancy.  C. BREAST, LEFT ADDITIONAL ANTERIOR MARGIN, EXCISION:      Benign breast tissue.      Negative for atypia or malignancy.   ONCOLOGY TABLE:  INVASIVE CARCINOMA OF THE BREAST:  Resection  Procedure: Lumpectomy Specimen Laterality: Left Histologic Type: Invasive ductal carcinoma Histologic Grade:      Glandular (Acinar)/Tubular Differentiation: 3      Nuclear Pleomorphism: 3      Mitotic Rate: 2      Overall Grade: 3 Tumor Size: 1.8 cm in maximal dimension Ductal Carcinoma In Situ: Solid type, intermediate to high grade Lymphatic and/or Vascular Invasion:  Not identified Treatment Effect in the Breast: No known presurgical therapy Margins: All margins negative for invasive carcinoma      Distance from Closest Margin (mm): 1.5 mm from the posterior margin       Specify Closest Margin (required only if <58mm): 1.5 mm from  the posterior margin DCIS Margins: Uninvolved by DCIS      Distance from Closest Margin (mm): 4 mm from the posterior margin      Specify Closest Margin (required only if <29mm): 4 mm from the posterior margin Regional Lymph Nodes: Not applicable (no lymph nodes submitted or found)      Number of Lymph Nodes Examined: 0      Number of Sentinel Nodes Examined: 0      Number of Lymph Nodes with Macrometastases (>2 mm): NA      Number of Lymph Nodes with Micrometastases: NA      Number of Lymph Nodes with Isolated Tumor Cells (=0.2 mm or =200 cells): NA      Size of Largest Metastatic Deposit (mm): NA      Extranodal Extension: NA Distant Metastasis:      Distant Site(s) Involved: Not applicable Breast Biomarker Testing Performed on Previous Biopsy:      Testing Performed on Case Number: [id number]            Estrogen Receptor: 80%, positive, moderately to strong staining intensity            Progesterone Receptor: 0%, negative            HER2: Negative for FISH            Ki-67: 30% Pathologic Stage Classification (pTNM, AJCC 8th Edition): pT1c, pNx Representative Tumor Block: A1, A2 Comment(s):  None (v4.5.0.0)   Receptor Status: ER(80%), PR (0%), Her2-neu (neg), Ki-67(30%)  Did patient present with symptoms (if so, please note symptoms) or was this found on screening mammography?: screening mammogram  Past/Anticipated interventions by surgeon, if any: 06-17-23 PRE-OPERATIVE DIAGNOSIS:  LEFT BREAST CANCER AND COMPLEX SCLEROSING LESION   POST-OPERATIVE DIAGNOSIS:  LEFT BREAST CANCER AND COMPLEX SCLEROSING LESION   PROCEDURE:  Procedure(s) with comments: LEFT BREAST RADIOACTIVE SEED LOCALIZED LUMPECTOMY x2 (Left) - one seed   SURGEON:  Surgeons and Role:    Griselda Miner, MD - Primary  Past/Anticipated interventions by medical oncology, if any:  06-02-23 Dr. Pamelia Hoit SUMMARY OF ONCOLOGIC HISTORY:    Oncology History  Malignant neoplasm of upper-outer quadrant of left  breast in female, estrogen receptor positive (HCC)  05/19/2023 Initial Diagnosis    Screening mammogram detected left breast mass UOQ 2.2 cm (in 2021 patient had CSL: Went on surveillance), by ultrasound measured 1.6 cm at 1 o'clock position (entire area with CSL 2.6 cm), axilla negative, biopsy grade 2 IDC ER 80%, PR 0%, Ki67 30%, HER2 negative by [facility]    06/02/2023 Cancer Staging    Staging form: Breast, AJCC 8th Edition - Clinical: Stage IB (cT2, cN0, cM0, G2, ER+, PR+, HER2-) - Signed by Serena Croissant, MD on 06/02/2023 Stage prefix: Initial diagnosis Histologic grading system: 3 grade system  ASSESSMENT AND PLAN:  Malignant neoplasm of upper-outer quadrant of left breast in female, estrogen receptor positive (HCC) 05/19/2023:Screening mammogram detected left breast mass UOQ 2.2 cm (in 2021 patient had CSL: Went on surveillance), by ultrasound measured 1.6 cm at 1 o'clock position (entire area with CSL 2.6 cm), axilla negative, biopsy grade 2 IDC ER 80%, PR 0%, Ki67 30%, HER2 negative by [facility]   Pathology and radiology counseling:Discussed with the patient, the details of pathology including the type of breast cancer,the clinical staging, the significance of ER, PR and HER-2/neu receptors and the implications for treatment. After reviewing the pathology in detail, we proceeded to discuss the different treatment options between surgery, radiation, chemotherapy, antiestrogen therapies.   Recommendations: 1. Breast conserving surgery followed by 2. Adjuvant radiation therapy followed by 3. Adjuvant antiestrogen therapy   Patient's husband is also one of my patients. Return to clinic after surgery to discuss final pathology report   Lymphedema issues, if any:  none to report  Pain issues, if any:  breast is still tender at this time  SAFETY ISSUES: Prior radiation? no Pacemaker/ICD? no Possible current pregnancy?no Is the patient on methotrexate? no  Current Complaints / other details:  No  major concerns or questions.   Vitals:   07/14/23 1342  BP: 136/78  Pulse: 81  Resp: 18  Temp: 97.7 F (36.5 C)  SpO2: 98%

## 2023-07-01 ENCOUNTER — Inpatient Hospital Stay: Payer: PPO | Attending: Hematology and Oncology | Admitting: Hematology and Oncology

## 2023-07-01 VITALS — BP 119/66 | HR 77 | Temp 97.3°F | Resp 18 | Ht 63.0 in | Wt 122.0 lb

## 2023-07-01 DIAGNOSIS — Z170 Estrogen receptor positive status [ER+]: Secondary | ICD-10-CM | POA: Insufficient documentation

## 2023-07-01 DIAGNOSIS — R002 Palpitations: Secondary | ICD-10-CM | POA: Diagnosis not present

## 2023-07-01 DIAGNOSIS — M810 Age-related osteoporosis without current pathological fracture: Secondary | ICD-10-CM | POA: Diagnosis not present

## 2023-07-01 DIAGNOSIS — D6851 Activated protein C resistance: Secondary | ICD-10-CM | POA: Diagnosis not present

## 2023-07-01 DIAGNOSIS — R7301 Impaired fasting glucose: Secondary | ICD-10-CM | POA: Diagnosis not present

## 2023-07-01 DIAGNOSIS — Z6822 Body mass index (BMI) 22.0-22.9, adult: Secondary | ICD-10-CM | POA: Diagnosis not present

## 2023-07-01 DIAGNOSIS — K30 Functional dyspepsia: Secondary | ICD-10-CM | POA: Diagnosis not present

## 2023-07-01 DIAGNOSIS — I2510 Atherosclerotic heart disease of native coronary artery without angina pectoris: Secondary | ICD-10-CM | POA: Diagnosis not present

## 2023-07-01 DIAGNOSIS — C50412 Malignant neoplasm of upper-outer quadrant of left female breast: Secondary | ICD-10-CM | POA: Diagnosis not present

## 2023-07-01 DIAGNOSIS — N319 Neuromuscular dysfunction of bladder, unspecified: Secondary | ICD-10-CM | POA: Diagnosis not present

## 2023-07-01 DIAGNOSIS — Z0000 Encounter for general adult medical examination without abnormal findings: Secondary | ICD-10-CM | POA: Diagnosis not present

## 2023-07-01 DIAGNOSIS — Z1331 Encounter for screening for depression: Secondary | ICD-10-CM | POA: Diagnosis not present

## 2023-07-01 DIAGNOSIS — Z79899 Other long term (current) drug therapy: Secondary | ICD-10-CM | POA: Diagnosis not present

## 2023-07-01 DIAGNOSIS — Z136 Encounter for screening for cardiovascular disorders: Secondary | ICD-10-CM | POA: Diagnosis not present

## 2023-07-01 DIAGNOSIS — H9193 Unspecified hearing loss, bilateral: Secondary | ICD-10-CM | POA: Diagnosis not present

## 2023-07-01 DIAGNOSIS — R053 Chronic cough: Secondary | ICD-10-CM | POA: Diagnosis not present

## 2023-07-01 DIAGNOSIS — Z86010 Personal history of colonic polyps: Secondary | ICD-10-CM | POA: Diagnosis not present

## 2023-07-01 LAB — HEMOGLOBIN A1C: A1c: 5.8

## 2023-07-01 NOTE — Assessment & Plan Note (Signed)
05/19/2023:Screening mammogram detected left breast mass UOQ 2.2 cm (in 2021 patient had CSL: Went on surveillance), by ultrasound measured 1.6 cm at 1 o'clock position (entire area with CSL 2.6 cm), axilla negative, biopsy grade 2 IDC ER 80%, PR 0%, Ki67 30%, HER2 negative by [facility]   06/17/2023: Left lumpectomy: Grade 3 IDC 1.8 cm with DCIS high-grade negative, lymphovascular invasion not identified, ER 80%, PR 0%, HER2 negative by FISH, Ki-67 30%  Pathology counseling: I discussed the final pathology report of the patient provided  a copy of this report. I discussed the margins as well as lymph node surgeries. We also discussed the final staging along with previously performed ER/PR and HER-2/neu testing.  Treatment plan:  Adjuvant radiation therapy followed by 2.  Adjuvant antiestrogen therapy   Patient's husband is also one of my patients. Return to clinic after radiation is complete.

## 2023-07-02 DIAGNOSIS — M6281 Muscle weakness (generalized): Secondary | ICD-10-CM | POA: Diagnosis not present

## 2023-07-02 DIAGNOSIS — M5416 Radiculopathy, lumbar region: Secondary | ICD-10-CM | POA: Diagnosis not present

## 2023-07-02 NOTE — Progress Notes (Signed)
Patient Care Team: [name], MD as PCP - General (Family Medicine) Donnelly Angelica, RN as Oncology Nurse Navigator Pershing Proud, RN as Oncology Nurse Navigator Serena Croissant, MD as Consulting Physician (Hematology and Oncology) Lonie Peak, MD as Attending Physician (Radiation Oncology) Griselda Miner, MD as Consulting Physician (General Surgery)  DIAGNOSIS:  Encounter Diagnosis  Name Primary?   Malignant neoplasm of upper-outer quadrant of left breast in female, estrogen receptor positive (HCC) Yes    SUMMARY OF ONCOLOGIC HISTORY: Oncology History  Malignant neoplasm of upper-outer quadrant of left breast in female, estrogen receptor positive (HCC)  05/19/2023 Initial Diagnosis   Screening mammogram detected left breast mass UOQ 2.2 cm (in 2021 patient had CSL: Went on surveillance), by ultrasound measured 1.6 cm at 1 o'clock position (entire area with CSL 2.6 cm), axilla negative, biopsy grade 2 IDC ER 80%, PR 0%, Ki67 30%, HER2 negative by [facility]   06/02/2023 Cancer Staging   Staging form: Breast, AJCC 8th Edition - Clinical: Stage IB (cT2, cN0, cM0, G2, ER+, PR+, HER2-) - Signed by Serena Croissant, MD on 06/02/2023 Stage prefix: Initial diagnosis Histologic grading system: 3 grade system   06/12/2023 Genetic Testing   Negative [facility] CustomNext-Cancer +RNA Panel.  Report date is 06/12/2023.   The CustomNext-Cancer+RNAinsight panel offered by [employer] includes sequencing and rearrangement analysis for the following 38 genes:  APC, ATM, BAP1, BARD1, BMPR1A, BRCA1, BRCA2, BRIP1, CDH1, CDK4, CDKN2A, CHEK2, DICER1, MLH1, MSH2, MSH6, MUTYH, NF1, NTHL1, PALB2, PMS2, POT1, PTEN, RAD51C, RAD51D, RB1, SMAD4, SMARCA4, STK11 and TP53 (sequencing and deletion/duplication); AXIN2, HOXB13, MITF, MSH3, POLD1 and POLE (sequencing only); EPCAM and GREM1 (deletion/duplication only). RNA data is routinely analyzed for use in variant interpretation for all genes.   06/17/2023 Surgery   Left  lumpectomy: Grade 3 IDC 1.8 cm with DCIS high-grade negative, lymphovascular invasion not identified, ER 80%, PR 0%, HER2 negative by FISH, Ki-67 30%     CHIEF COMPLIANT:   Discussed the use of AI scribe software for clinical note transcription with the patient, who gave verbal consent to proceed.  History of Present Illness   Regina [name], a patient with a recent history of breast cancer, presents two weeks post-surgery. Regina [name] admits to feeling a little sore at times but attributes this to not slowing down much after surgery. She recently returned from a three-day workshop in [place], where she was careful not to overexert herself.  Regina [name] also discusses the results of her surgery. The final size of the removed tumor was 1.8 cm, slightly larger than the 1.6 cm estimated by imaging. The tumor was graded as a three, indicating it was "angry looking." The surgery also found and removed some precancerous cells, and the margins for both the cancerous and precancerous cells were negative, indicating that all necessary tissue was removed. The cancer was found to be estrogen-positive, progesterone-negative, and HER2-negative, with a growth rate of around 30%.  Regina [name] is an active individual, recently returning from a workshop in [place] and planning future travels to [place] and [place]. She expresses a desire to understand all her treatment options and to maintain her active lifestyle.     ALLERGIES:  is allergic to nsaids, ketoprofen, tramadol, macrodantin [nitrofurantoin macrocrystal], oxycodone, and venlafaxine.  MEDICATIONS:  Current Outpatient Medications  Medication Sig Dispense Refill   albuterol (VENTOLIN HFA) 108 (90 Base) MCG/ACT inhaler Inhale into the lungs every 6 (six) hours as needed for wheezing or shortness of breath.     ALPRAZOLAM PO Take by  mouth as needed.     Ascorbic Acid (VITAMIN C) 1000 MG tablet Take 1,000 mg by mouth daily.      aspirin 81 MG EC tablet Take by mouth 2 (two)  times a week.     atorvastatin (LIPITOR) 10 MG tablet Take 1 tablet (10 mg total) by mouth daily. 90 tablet 2   b complex vitamins capsule Take 1 capsule by mouth daily.     calcium citrate-vitamin D (CITRACAL+D) 315-200 MG-UNIT tablet Take 1 tablet by mouth every morning.     Cholecalciferol (VITAMIN D3) 2000 units TABS Take 2,000 Units by mouth daily.     famotidine (PEPCID) 20 MG tablet Take 1 tablet (20 mg total) by mouth at bedtime as needed for heartburn or indigestion. 90 tablet 1   fluticasone (FLONASE) 50 MCG/ACT nasal spray Place into both nostrils daily.     Fluticasone Furoate (ARNUITY ELLIPTA) 100 MCG/ACT AEPB Inhale into the lungs.     Lutein 6 MG CAPS Take 1 capsule by mouth with breakfast, with lunch, and with evening meal.     Lysine 1000 MG TABS Take 1 tablet by mouth daily.     Magnesium 500 MG CAPS Take 1 capsule by mouth daily.     mirabegron ER (MYRBETRIQ) 50 MG TB24 tablet Take 1 tablet by mouth daily.     Omega-3 Fatty Acids (OMEGA 3 500 PO) Take 1 tablet by mouth daily at 6 (six) AM.     omeprazole (PRILOSEC) 40 MG capsule Take 40 mg by mouth 2 (two) times daily.     Zinc Sulfate (ZINC-220 PO) Take 1 tablet by mouth daily at 6 (six) AM.     No current facility-administered medications for this visit.    PHYSICAL EXAMINATION: ECOG PERFORMANCE STATUS: 1 - Symptomatic but completely ambulatory  Vitals:   07/01/23 1209  BP: 119/66  Pulse: 77  Resp: 18  Temp: (!) 97.3 F (36.3 C)  SpO2: 100%   Filed Weights   07/01/23 1209  Weight: 122 lb (55.3 kg)    LABORATORY DATA:  I have reviewed the data as listed    Latest Ref Rng & Units 06/02/2023   12:22 PM 11/26/2022    8:41 AM 05/01/2022   12:08 PM  CMP  Glucose 70 - 99 mg/dL 259  92  81   BUN 8 - 23 mg/dL 17  17  15    Creatinine 0.44 - 1.00 mg/dL 5.63  8.75  6.43   Sodium 135 - 145 mmol/L 140  143  141   Potassium 3.5 - 5.1 mmol/L 4.4  3.9  3.9   Chloride 98 - 111 mmol/L 105  104  108   CO2 22 - 32  mmol/L 32  26  23   Calcium 8.9 - 10.3 mg/dL 9.2  8.9  8.8   Total Protein 6.5 - 8.1 g/dL 6.6     Total Bilirubin 0.3 - 1.2 mg/dL 0.6     Alkaline Phos 38 - 126 U/L 66     AST 15 - 41 U/L 24     ALT 0 - 44 U/L 24       Lab Results  Component Value Date   WBC 6.5 06/02/2023   HGB 13.7 06/02/2023   HCT 42.4 06/02/2023   MCV 94.2 06/02/2023   PLT 178 06/02/2023   NEUTROABS 3.9 06/02/2023    ASSESSMENT & PLAN:  Malignant neoplasm of upper-outer quadrant of left breast in female, estrogen receptor positive (HCC) 05/19/2023:Screening mammogram  detected left breast mass UOQ 2.2 cm (in 2021 patient had CSL: Went on surveillance), by ultrasound measured 1.6 cm at 1 o'clock position (entire area with CSL 2.6 cm), axilla negative, biopsy grade 2 IDC ER 80%, PR 0%, Ki67 30%, HER2 negative by [facility]   06/17/2023: Left lumpectomy: Grade 3 IDC 1.8 cm with DCIS high-grade negative, lymphovascular invasion not identified, ER 80%, PR 0%, HER2 negative by FISH, Ki-67 30%  Pathology counseling: I discussed the final pathology report of the patient provided  a copy of this report. I discussed the margins as well as lymph node surgeries. We also discussed the final staging along with previously performed ER/PR and HER-2/neu testing.  Treatment plan:  Adjuvant radiation therapy followed by 2.  Adjuvant antiestrogen therapy   Patient's husband is also one of my patients. Return to clinic after radiation is complete.  ------------------------------------- Assessment and Plan    Breast Cancer Post-Operative Two weeks post-surgery, patient reports some soreness but has been active. Final tumor size was 1.8 cm, grade 3, with negative margins. No lymphovascular invasion. Tumor was estrogen receptor positive, progesterone receptor negative, HER2 negative, with a growth rate of 30%. Final stage is 1B. - Patient can resume wearing regular bras. - Discuss radiation therapy options with Dr. Basilio Cairo on October 2nd,  2024. - After radiation therapy completion, start anti-estrogen therapy.  General Health Maintenance / Followup Plans - Follow up after radiation therapy completion to start anti-estrogen therapy. - Regular monitoring and follow-up appointments as needed.      No orders of the defined types were placed in this encounter.  The patient has a good understanding of the overall plan. she agrees with it. she will call with any problems that may develop before the next visit here. Total time spent: 30 mins including face to face time and time spent for planning, charting and co-ordination of care   Tamsen Meek, MD 07/02/23

## 2023-07-05 ENCOUNTER — Telehealth: Payer: Self-pay | Admitting: Emergency Medicine

## 2023-07-05 NOTE — Telephone Encounter (Signed)
PT would like to see Dr. Delton Coombes again. Her last appt was w/Dr. Celine Mans. I told her as a courtesy we needed to ask both Dr's to get there OK.  Once approved she states she has the same cough, only worse, and Dr. Delton Coombes had put her thru several tests at the time.  Please advise if it is OK with you both to make the change and I will call PT for appt. T&Y.

## 2023-07-06 DIAGNOSIS — M6281 Muscle weakness (generalized): Secondary | ICD-10-CM | POA: Diagnosis not present

## 2023-07-06 DIAGNOSIS — M5416 Radiculopathy, lumbar region: Secondary | ICD-10-CM | POA: Diagnosis not present

## 2023-07-07 NOTE — Telephone Encounter (Signed)
I asked Dr Sherene Sires if he would be willing to see her regarding cough. If it is OK w Dr Celine Mans, then we can refer her to see MW. If the pt doesn't want to see MW, then ok to send to [facility]

## 2023-07-09 DIAGNOSIS — M5416 Radiculopathy, lumbar region: Secondary | ICD-10-CM | POA: Diagnosis not present

## 2023-07-09 DIAGNOSIS — M6281 Muscle weakness (generalized): Secondary | ICD-10-CM | POA: Diagnosis not present

## 2023-07-11 NOTE — Telephone Encounter (Signed)
I am covering for Dr. Celine Mans. Shouldn't be an issues to switch providers.  Thanks, JD

## 2023-07-13 ENCOUNTER — Telehealth: Payer: Self-pay | Admitting: Emergency Medicine

## 2023-07-13 NOTE — Telephone Encounter (Signed)
Per last tel encounter OK to sched PT w/Dr. Delton Coombes. Please try again. I got no answer.

## 2023-07-13 NOTE — Telephone Encounter (Signed)
Appt made. NFN

## 2023-07-14 ENCOUNTER — Encounter: Payer: Self-pay | Admitting: Radiation Oncology

## 2023-07-14 ENCOUNTER — Ambulatory Visit
Admission: RE | Admit: 2023-07-14 | Discharge: 2023-07-14 | Discharge status: Home / Self Care | Payer: PPO | Source: Ambulatory Visit | Attending: Radiation Oncology

## 2023-07-14 ENCOUNTER — Ambulatory Visit
Admission: RE | Admit: 2023-07-14 | Discharge: 2023-07-14 | Disposition: A | Discharge status: Home / Self Care | Payer: PPO | Source: Ambulatory Visit | Attending: Radiation Oncology | Admitting: Radiation Oncology

## 2023-07-14 VITALS — BP 136/78 | HR 81 | Temp 97.7°F | Resp 18 | Ht 63.0 in | Wt 124.0 lb

## 2023-07-14 DIAGNOSIS — C50412 Malignant neoplasm of upper-outer quadrant of left female breast: Secondary | ICD-10-CM | POA: Insufficient documentation

## 2023-07-14 DIAGNOSIS — Z170 Estrogen receptor positive status [ER+]: Secondary | ICD-10-CM | POA: Insufficient documentation

## 2023-07-14 DIAGNOSIS — Z79899 Other long term (current) drug therapy: Secondary | ICD-10-CM | POA: Insufficient documentation

## 2023-07-14 DIAGNOSIS — Z510 Encounter for antineoplastic radiation therapy: Secondary | ICD-10-CM | POA: Insufficient documentation

## 2023-07-14 NOTE — Progress Notes (Signed)
Radiation Oncology         [phone] ________________________________  Name: Regina [name] MRN: [id number]  Date: 07/14/2023  DOB: October 30, 1940  Follow-Up Visit Note  Outpatient  CC: [name], MD  Serena Croissant, MD  Diagnosis:      ICD-10-CM   1. Malignant neoplasm of upper-outer quadrant of left breast in female, estrogen receptor positive (HCC)  C50.412    Z17.0      Left Breast UOQ, Invasive ductal carcinoma with intermediate to high grade DCISa, ER+ / PR- / Her2-, Grade 3: s/p lumpectomy    Cancer Staging  Malignant neoplasm of upper-outer quadrant of left breast in female, estrogen receptor positive (HCC) Staging form: Breast, AJCC 8th Edition - Clinical: Stage IB (cT2, cN0, cM0, G2, ER+, PR+, HER2-) - Signed by Serena Croissant, MD on 06/02/2023  pT1c N0  CHIEF COMPLAINT: Here to discuss management of left breast cancer  Narrative:  The patient returns today for follow-up.     On her breast clinic consultation date of 06/02/23, she underwent genetic testing. Results showed no clinically significant variants detected by BRCAplus or +RNAinsight testing.  Since her consultation date, she underwent a left breast lumpectomy without nodal biopsies on 06/17/23 under the care of Dr. Carolynne Edouard. Pathology from the procedure revealed: tumor size of 1.8 cm; histology of grade 2 invasive ductal carcinoma with intermediate to high grade DCIS; all margins negative for invasive and in situ carcinoma; margin status to invasive disease of 1.5 mm from the posterior margin; margin status to in situ disease of 4 mm from the posterior margin; ER status: 80% positive with moderate to strong staining intensity; PR status negative; Proliferation marker Ki67 at 30%; Her2 status negative; Grade 3.  Post-operatively, she developed a small seroma and some lateral left breast pain. Both of these have since resolved on their own.   She will return to Dr. Pamelia Hoit following XRT to discuss antiestrogen  treatment options.   Symptomatically, the patient reports: Lymphedema issues, if any:  none to report  Pain issues, if any:  breast is still tender at this time  SAFETY ISSUES: Prior radiation? no Pacemaker/ICD? no Possible current pregnancy?no Is the patient on methotrexate? no        ALLERGIES:  is allergic to nsaids, ketoprofen, tramadol, macrodantin [nitrofurantoin macrocrystal], oxycodone, and venlafaxine.  Meds: Current Outpatient Medications  Medication Sig Dispense Refill   albuterol (VENTOLIN HFA) 108 (90 Base) MCG/ACT inhaler Inhale into the lungs every 6 (six) hours as needed for wheezing or shortness of breath.     ALPRAZOLAM PO Take by mouth as needed.     Ascorbic Acid (VITAMIN C) 1000 MG tablet Take 1,000 mg by mouth daily.      aspirin 81 MG EC tablet Take by mouth 2 (two) times a week.     atorvastatin (LIPITOR) 10 MG tablet Take 1 tablet (10 mg total) by mouth daily. 90 tablet 2   b complex vitamins capsule Take 1 capsule by mouth daily.     calcium citrate-vitamin D (CITRACAL+D) 315-200 MG-UNIT tablet Take 1 tablet by mouth every morning.     Cholecalciferol (VITAMIN D3) 2000 units TABS Take 2,000 Units by mouth daily.     famotidine (PEPCID) 20 MG tablet Take 1 tablet (20 mg total) by mouth at bedtime as needed for heartburn or indigestion. 90 tablet 1   fluticasone (FLONASE) 50 MCG/ACT nasal spray Place into both nostrils daily.     Fluticasone Furoate (ARNUITY ELLIPTA) 100 MCG/ACT AEPB  Inhale into the lungs.     Lutein 6 MG CAPS Take 1 capsule by mouth with breakfast, with lunch, and with evening meal.     Lysine 1000 MG TABS Take 1 tablet by mouth daily.     Magnesium 500 MG CAPS Take 1 capsule by mouth daily.     mirabegron ER (MYRBETRIQ) 50 MG TB24 tablet Take 1 tablet by mouth daily.     Omega-3 Fatty Acids (OMEGA 3 500 PO) Take 1 tablet by mouth daily at 6 (six) AM.     omeprazole (PRILOSEC) 40 MG capsule Take 40 mg by mouth 2 (two) times daily.     Zinc  Sulfate (ZINC-220 PO) Take 1 tablet by mouth daily at 6 (six) AM.     No current facility-administered medications for this encounter.    Physical Findings:  height is 5\' 3"  (1.6 m) and weight is 124 lb (56.2 kg). Her temporal temperature is 97.7 F (36.5 C). Her blood pressure is 136/78 and her pulse is 81. Her respiration is 18 and oxygen saturation is 98%. .     General: Alert and oriented, in no acute distress HEENT: Head is normocephalic. Extraocular movements are intact.  Extremities: No cyanosis or edema. Musculoskeletal: symmetric strength and muscle tone throughout. Neurologic: No obvious focalities. Speech is fluent.  Psychiatric: Judgment and insight are intact. Affect is appropriate. Breast exam reveals satisfactory healing from left lumpectomy with left nipple inversion  Lab Findings: Lab Results  Component Value Date   WBC 6.5 06/02/2023   HGB 13.7 06/02/2023   HCT 42.4 06/02/2023   MCV 94.2 06/02/2023   PLT 178 06/02/2023      Radiographic Findings: No results found.  Impression/Plan: We discussed adjuvant radiotherapy today.  We again discussed optional radiation.  She understands it will not improve her life expectancy but would improve her likelihood of cure and local control in her left breast.  She would like to proceed with this.  We talked about ultra hypofractionation and standard hypofractionation.  She would like to pursue standard hypofractionation with a boost.  I think this is a reasonable approach for her.  I recommend 4 weeks of standard hypofractionation radiation therapy to the left breast in order to optimize her prognosis and local control.  I reviewed the logistics, benefits, risks, and potential side effects of this treatment in detail. Risks may include but not necessary be limited to acute and late injury tissue in the radiation fields such as skin irritation (change in color/pigmentation, itching, dryness, pain, peeling). She may experience  fatigue. We also discussed possible risk of long term cosmetic changes or scar tissue. There is also a smaller risk for lung toxicity, cardiac toxicity, brachial plexopathy, lymphedema, musculoskeletal changes, rib fragility or induction of a second malignancy, late chronic non-healing soft tissue wound.    The patient asked good questions which I answered to her satisfaction. She is enthusiastic about proceeding with treatment. A consent form has been  signed and placed in her chart.  We will proceed with treatment planning today and start her treatment after she returns from [place] and has a little bit of time to recover from her jet lag.    On date of service, in total, I spent 40 minutes on this encounter. Patient was seen in person.  _____________________________________   Lonie Peak, MD  This document serves as a record of services personally performed by Lonie Peak, MD. It was created on her behalf by Neena Rhymes, a trained  medical scribe. The creation of this record is based on the scribe's personal observations and the provider's statements to them. This document has been checked and approved by the attending provider.

## 2023-07-15 DIAGNOSIS — M5416 Radiculopathy, lumbar region: Secondary | ICD-10-CM | POA: Diagnosis not present

## 2023-07-15 DIAGNOSIS — M6281 Muscle weakness (generalized): Secondary | ICD-10-CM | POA: Diagnosis not present

## 2023-07-15 DIAGNOSIS — R052 Subacute cough: Secondary | ICD-10-CM | POA: Diagnosis not present

## 2023-07-15 DIAGNOSIS — K219 Gastro-esophageal reflux disease without esophagitis: Secondary | ICD-10-CM | POA: Diagnosis not present

## 2023-07-15 DIAGNOSIS — J309 Allergic rhinitis, unspecified: Secondary | ICD-10-CM | POA: Diagnosis not present

## 2023-07-19 DIAGNOSIS — Z170 Estrogen receptor positive status [ER+]: Secondary | ICD-10-CM | POA: Diagnosis not present

## 2023-07-19 DIAGNOSIS — C50412 Malignant neoplasm of upper-outer quadrant of left female breast: Secondary | ICD-10-CM | POA: Diagnosis not present

## 2023-07-19 DIAGNOSIS — Z510 Encounter for antineoplastic radiation therapy: Secondary | ICD-10-CM | POA: Diagnosis not present

## 2023-07-20 ENCOUNTER — Encounter: Payer: Self-pay | Admitting: *Deleted

## 2023-07-20 DIAGNOSIS — C50412 Malignant neoplasm of upper-outer quadrant of left female breast: Secondary | ICD-10-CM

## 2023-07-27 ENCOUNTER — Telehealth: Payer: Self-pay | Admitting: Hematology and Oncology

## 2023-07-27 NOTE — Telephone Encounter (Signed)
Patient is aware of scheduled follow up appointment times/dates with [facility]

## 2023-07-28 ENCOUNTER — Telehealth: Payer: Self-pay

## 2023-07-28 DIAGNOSIS — M810 Age-related osteoporosis without current pathological fracture: Secondary | ICD-10-CM | POA: Diagnosis not present

## 2023-07-28 DIAGNOSIS — Z77018 Contact with and (suspected) exposure to other hazardous metals: Secondary | ICD-10-CM | POA: Diagnosis not present

## 2023-07-28 NOTE — Telephone Encounter (Signed)
Rn left message for pt about question about tens unit being used during radiation treatments. Rn was called by patients physical therapist who had already told pt that the tens unit was contraindicated during radiation therapy. Rn confirmed on message to pt that the tens unit should not be used during treatment. RN left call back details with message in case pt had further questions or concerns.

## 2023-07-30 ENCOUNTER — Telehealth: Payer: Self-pay

## 2023-07-30 NOTE — Telephone Encounter (Signed)
Pt called RN to let us know that she tested positive for Covid today. She ran fevers yesterday but reports she is starting to feel better. She reported to RN that she still feels like she can start on Monday for treatment. Rn reached to support RT to help reschedule this pt based on the current findings of Covid. Support RT will reach out to pt to schedule and discuss how she will enter etc.

## 2023-08-02 ENCOUNTER — Other Ambulatory Visit: Payer: Self-pay

## 2023-08-02 ENCOUNTER — Ambulatory Visit
Admission: RE | Admit: 2023-08-02 | Discharge: 2023-08-02 | Disposition: A | Discharge status: Home / Self Care | Payer: PPO | Source: Ambulatory Visit | Attending: Radiation Oncology | Admitting: Radiation Oncology

## 2023-08-02 ENCOUNTER — Ambulatory Visit: Payer: PPO

## 2023-08-02 DIAGNOSIS — C50412 Malignant neoplasm of upper-outer quadrant of left female breast: Secondary | ICD-10-CM | POA: Diagnosis not present

## 2023-08-02 DIAGNOSIS — Z170 Estrogen receptor positive status [ER+]: Secondary | ICD-10-CM

## 2023-08-02 DIAGNOSIS — Z510 Encounter for antineoplastic radiation therapy: Secondary | ICD-10-CM | POA: Diagnosis not present

## 2023-08-02 LAB — RAD ONC ARIA SESSION SUMMARY
Course Elapsed Days: 0
Plan Fractions Treated to Date: 1
Plan Prescribed Dose Per Fraction: 2.67 Gy
Plan Total Fractions Prescribed: 15
Plan Total Prescribed Dose: 40.05 Gy
Reference Point Dosage Given to Date: 2.67 Gy
Reference Point Session Dosage Given: 2.67 Gy
Session Number: 1

## 2023-08-02 MED ORDER — ALRA NON-METALLIC DEODORANT (RAD-ONC): 1.0000 | Freq: Once | TOPICAL | Status: DC

## 2023-08-02 MED ORDER — RADIAPLEXRX EX GEL: Freq: Once | CUTANEOUS | Status: DC

## 2023-08-02 MED ORDER — RADIAPLEXRX EX GEL
Freq: Once | CUTANEOUS | Status: AC
  Administered 2023-08-02: 1 via TOPICAL

## 2023-08-02 MED ORDER — ALRA NON-METALLIC DEODORANT (RAD-ONC)
1.0000 | Freq: Once | TOPICAL | Status: AC
  Administered 2023-08-02: 1 via TOPICAL

## 2023-08-03 ENCOUNTER — Ambulatory Visit: Payer: PPO

## 2023-08-03 ENCOUNTER — Telehealth: Payer: Self-pay | Admitting: Internal Medicine

## 2023-08-03 NOTE — Telephone Encounter (Signed)
Good morning Dr. Rhea Belton,   I received a call from this patient, stating she tested positive for Covid last week. She states she does not have a fever and only has a congestion. Patient would like to know if she can continue her appointment with you tomorrow at 8:30 AM. If not, she would like to know if she can complete the visit virtually. Would you please advise?  Thank you.

## 2023-08-03 NOTE — Telephone Encounter (Signed)
Please change visit to virtual Thanks JMP

## 2023-08-04 ENCOUNTER — Telehealth (INDEPENDENT_AMBULATORY_CARE_PROVIDER_SITE_OTHER): Payer: PPO | Admitting: Internal Medicine

## 2023-08-04 ENCOUNTER — Ambulatory Visit
Admission: RE | Admit: 2023-08-04 | Discharge: 2023-08-04 | Disposition: A | Discharge status: Home / Self Care | Payer: PPO | Source: Ambulatory Visit | Attending: Radiation Oncology | Admitting: Radiation Oncology

## 2023-08-04 ENCOUNTER — Encounter: Payer: Self-pay | Admitting: Internal Medicine

## 2023-08-04 ENCOUNTER — Other Ambulatory Visit: Payer: Self-pay

## 2023-08-04 DIAGNOSIS — Z170 Estrogen receptor positive status [ER+]: Secondary | ICD-10-CM | POA: Diagnosis not present

## 2023-08-04 DIAGNOSIS — K219 Gastro-esophageal reflux disease without esophagitis: Secondary | ICD-10-CM | POA: Diagnosis not present

## 2023-08-04 DIAGNOSIS — C50412 Malignant neoplasm of upper-outer quadrant of left female breast: Secondary | ICD-10-CM | POA: Diagnosis not present

## 2023-08-04 DIAGNOSIS — Z860100 Personal history of colon polyps, unspecified: Secondary | ICD-10-CM | POA: Diagnosis not present

## 2023-08-04 DIAGNOSIS — K5909 Other constipation: Secondary | ICD-10-CM | POA: Diagnosis not present

## 2023-08-04 DIAGNOSIS — R053 Chronic cough: Secondary | ICD-10-CM

## 2023-08-04 DIAGNOSIS — Z510 Encounter for antineoplastic radiation therapy: Secondary | ICD-10-CM | POA: Diagnosis not present

## 2023-08-04 LAB — RAD ONC ARIA SESSION SUMMARY
Course Elapsed Days: 2
Plan Fractions Treated to Date: 2
Plan Prescribed Dose Per Fraction: 2.67 Gy
Plan Total Fractions Prescribed: 15
Plan Total Prescribed Dose: 40.05 Gy
Reference Point Dosage Given to Date: 5.34 Gy
Reference Point Session Dosage Given: 2.67 Gy
Session Number: 2

## 2023-08-04 NOTE — Progress Notes (Signed)
Subjective:  This service was provided via telemedicine with audio/visual communication. The patient was located at home The provider was located in provider's GI office. The patient did consent to this telephone visit and is aware of possible charges through their insurance for this visit.   The persons participating in this telemedicine service were the patient and I. Time spent on call: 17 min     Patient ID: Regina [name], female    DOB: 1941-09-06, 82 y.o.   MRN: [id number]  HPI Regina [name] is a 82 year old female with a history of GERD, adenomatous colon polyps, chronic constipation, colonic diverticulosis, recent diagnosis of breast cancer status postlumpectomy and on XRT, neurogenic bladder, tethered spinal cord, chronic cough who is seen for follow-up.  She was seen in March 2024 to discuss chronic cough and then had esophageal manometry and pH testing in June.  She is seen today by video conference visit by MyChart.  Her cough has continued and she was surprised to know that her reflux was controlled though she has not had much heartburn.  She has continued omeprazole 40 mg in the morning and famotidine 20 mg in the evening.  She is just getting over COVID diagnosed last week.  Her first known infection.  She had fever for 2 or 3 days but no fevers for 2 or 3 days.  She recently traveled to [place] for her 40th anniversary.  She is not having chest pain or shortness of breath.  Bowel habits have been regular.  No issues with constipation though she has continued magnesium.  No abdominal pain.  She will see Dr. Delton Coombes for pulmonary consultation or reconsultation in November Dr. Uvaldo Rising her primary care provider with [facility] is checking urine for heavy metals.  Review of Systems As per HPI, otherwise negative  Current Medications, Allergies, Past Medical History, Past Surgical History, Family History and Social History were reviewed in [employer]  record.    Objective:   Physical Exam No vital signs taken for this virtual visit Gen: Patient well-appearing by video conference  24-hour pH and impedance with esophageal manometry was performed over the summer 2024.  This was to evaluate chronic cough. Esophageal manometry was normal. 24-hour pH study showed a very low DeMeester score without pathologic esophageal acid exposure.  There was no symptom correlation for heartburn or cough with reflux events.     Assessment & Plan:  82 year old female with a history of GERD, adenomatous colon polyps, chronic constipation, colonic diverticulosis, neurogenic bladder, tethered spinal cord, chronic cough who is seen for follow-up.  GERD/chronic cough --we now know with impedance and pH testing that her GERD is well-controlled on omeprazole 40 mg daily.  Her cough is not associated with her reflux based on pH and impedance testing.  She will follow-up with primary care and pulmonary for her chronic cough --Continue omeprazole 40 mg daily which is controlling her acid reflux very well by pH testing --Discontinue famotidine  2.  Chronic constipation -under control now with magnesium.  3.  History of adenomatous colon polyps --surveillance would be recommended around October 2028 based on overall health at that time.  4.  Breast cancer --status postlumpectomy with current radiation therapy.  She will follow-up with Dr. Pamelia Hoit.  Follow-up in 1 year, sooner if needed  30 minutes total spent today including patient facing time, coordination of care, reviewing medical history/procedures/pertinent radiology studies, and documentation of the encounter.

## 2023-08-05 ENCOUNTER — Other Ambulatory Visit: Payer: Self-pay

## 2023-08-05 ENCOUNTER — Ambulatory Visit
Admission: RE | Admit: 2023-08-05 | Discharge: 2023-08-05 | Disposition: A | Discharge status: Home / Self Care | Payer: PPO | Source: Ambulatory Visit | Attending: Radiation Oncology | Admitting: Radiation Oncology

## 2023-08-05 DIAGNOSIS — C50412 Malignant neoplasm of upper-outer quadrant of left female breast: Secondary | ICD-10-CM | POA: Diagnosis not present

## 2023-08-05 DIAGNOSIS — Z170 Estrogen receptor positive status [ER+]: Secondary | ICD-10-CM | POA: Diagnosis not present

## 2023-08-05 DIAGNOSIS — Z510 Encounter for antineoplastic radiation therapy: Secondary | ICD-10-CM | POA: Diagnosis not present

## 2023-08-05 LAB — RAD ONC ARIA SESSION SUMMARY
Course Elapsed Days: 3
Plan Fractions Treated to Date: 3
Plan Prescribed Dose Per Fraction: 2.67 Gy
Plan Total Fractions Prescribed: 15
Plan Total Prescribed Dose: 40.05 Gy
Reference Point Dosage Given to Date: 8.01 Gy
Reference Point Session Dosage Given: 2.67 Gy
Session Number: 3

## 2023-08-06 ENCOUNTER — Other Ambulatory Visit: Payer: Self-pay

## 2023-08-06 ENCOUNTER — Ambulatory Visit
Admission: RE | Admit: 2023-08-06 | Discharge: 2023-08-06 | Disposition: A | Discharge status: Home / Self Care | Payer: PPO | Source: Ambulatory Visit | Attending: Radiation Oncology | Admitting: Radiation Oncology

## 2023-08-06 DIAGNOSIS — C50412 Malignant neoplasm of upper-outer quadrant of left female breast: Secondary | ICD-10-CM | POA: Diagnosis not present

## 2023-08-06 DIAGNOSIS — Z510 Encounter for antineoplastic radiation therapy: Secondary | ICD-10-CM | POA: Diagnosis not present

## 2023-08-06 DIAGNOSIS — Z170 Estrogen receptor positive status [ER+]: Secondary | ICD-10-CM | POA: Diagnosis not present

## 2023-08-06 LAB — RAD ONC ARIA SESSION SUMMARY
Course Elapsed Days: 4
Plan Fractions Treated to Date: 4
Plan Prescribed Dose Per Fraction: 2.67 Gy
Plan Total Fractions Prescribed: 15
Plan Total Prescribed Dose: 40.05 Gy
Reference Point Dosage Given to Date: 10.68 Gy
Reference Point Session Dosage Given: 2.67 Gy
Session Number: 4

## 2023-08-07 ENCOUNTER — Other Ambulatory Visit: Payer: Self-pay | Admitting: Internal Medicine

## 2023-08-09 ENCOUNTER — Other Ambulatory Visit: Payer: Self-pay

## 2023-08-09 ENCOUNTER — Ambulatory Visit
Admission: RE | Admit: 2023-08-09 | Discharge: 2023-08-09 | Disposition: A | Discharge status: Home / Self Care | Payer: PPO | Source: Ambulatory Visit | Attending: Radiation Oncology | Admitting: Radiation Oncology

## 2023-08-09 ENCOUNTER — Telehealth: Payer: Self-pay | Admitting: Hematology and Oncology

## 2023-08-09 DIAGNOSIS — Z170 Estrogen receptor positive status [ER+]: Secondary | ICD-10-CM | POA: Diagnosis not present

## 2023-08-09 DIAGNOSIS — M6281 Muscle weakness (generalized): Secondary | ICD-10-CM | POA: Diagnosis not present

## 2023-08-09 DIAGNOSIS — Z510 Encounter for antineoplastic radiation therapy: Secondary | ICD-10-CM | POA: Diagnosis not present

## 2023-08-09 DIAGNOSIS — M5416 Radiculopathy, lumbar region: Secondary | ICD-10-CM | POA: Diagnosis not present

## 2023-08-09 DIAGNOSIS — C50412 Malignant neoplasm of upper-outer quadrant of left female breast: Secondary | ICD-10-CM | POA: Diagnosis not present

## 2023-08-09 LAB — RAD ONC ARIA SESSION SUMMARY
Course Elapsed Days: 7
Plan Fractions Treated to Date: 5
Plan Prescribed Dose Per Fraction: 2.67 Gy
Plan Total Fractions Prescribed: 15
Plan Total Prescribed Dose: 40.05 Gy
Reference Point Dosage Given to Date: 13.35 Gy
Reference Point Session Dosage Given: 2.67 Gy
Session Number: 5

## 2023-08-09 NOTE — Telephone Encounter (Signed)
Patient is aware of rescheduled appointment times/dates 

## 2023-08-10 ENCOUNTER — Ambulatory Visit
Admission: RE | Admit: 2023-08-10 | Discharge: 2023-08-10 | Disposition: A | Discharge status: Home / Self Care | Payer: PPO | Source: Ambulatory Visit | Attending: Radiation Oncology | Admitting: Radiation Oncology

## 2023-08-10 ENCOUNTER — Other Ambulatory Visit: Payer: Self-pay

## 2023-08-10 DIAGNOSIS — Z510 Encounter for antineoplastic radiation therapy: Secondary | ICD-10-CM | POA: Diagnosis not present

## 2023-08-10 DIAGNOSIS — C50412 Malignant neoplasm of upper-outer quadrant of left female breast: Secondary | ICD-10-CM | POA: Diagnosis not present

## 2023-08-10 DIAGNOSIS — Z170 Estrogen receptor positive status [ER+]: Secondary | ICD-10-CM | POA: Diagnosis not present

## 2023-08-10 LAB — RAD ONC ARIA SESSION SUMMARY
Course Elapsed Days: 8
Plan Fractions Treated to Date: 6
Plan Prescribed Dose Per Fraction: 2.67 Gy
Plan Total Fractions Prescribed: 15
Plan Total Prescribed Dose: 40.05 Gy
Reference Point Dosage Given to Date: 16.02 Gy
Reference Point Session Dosage Given: 2.67 Gy
Session Number: 6

## 2023-08-11 ENCOUNTER — Other Ambulatory Visit: Payer: Self-pay

## 2023-08-11 ENCOUNTER — Ambulatory Visit
Admission: RE | Admit: 2023-08-11 | Discharge: 2023-08-11 | Disposition: A | Discharge status: Home / Self Care | Payer: PPO | Source: Ambulatory Visit | Attending: Radiation Oncology

## 2023-08-11 DIAGNOSIS — Z510 Encounter for antineoplastic radiation therapy: Secondary | ICD-10-CM | POA: Diagnosis not present

## 2023-08-11 LAB — RAD ONC ARIA SESSION SUMMARY
Course Elapsed Days: 9
Plan Fractions Treated to Date: 7
Plan Prescribed Dose Per Fraction: 2.67 Gy
Plan Total Fractions Prescribed: 15
Plan Total Prescribed Dose: 40.05 Gy
Reference Point Dosage Given to Date: 18.69 Gy
Reference Point Session Dosage Given: 2.67 Gy
Session Number: 7

## 2023-08-12 ENCOUNTER — Other Ambulatory Visit: Payer: Self-pay

## 2023-08-12 ENCOUNTER — Ambulatory Visit
Admission: RE | Admit: 2023-08-12 | Discharge: 2023-08-12 | Disposition: A | Discharge status: Home / Self Care | Payer: PPO | Source: Ambulatory Visit | Attending: Radiation Oncology | Admitting: Radiation Oncology

## 2023-08-12 DIAGNOSIS — Z170 Estrogen receptor positive status [ER+]: Secondary | ICD-10-CM | POA: Diagnosis not present

## 2023-08-12 DIAGNOSIS — Z510 Encounter for antineoplastic radiation therapy: Secondary | ICD-10-CM | POA: Diagnosis not present

## 2023-08-12 DIAGNOSIS — C50412 Malignant neoplasm of upper-outer quadrant of left female breast: Secondary | ICD-10-CM | POA: Diagnosis not present

## 2023-08-12 LAB — RAD ONC ARIA SESSION SUMMARY
Course Elapsed Days: 10
Plan Fractions Treated to Date: 8
Plan Prescribed Dose Per Fraction: 2.67 Gy
Plan Total Fractions Prescribed: 15
Plan Total Prescribed Dose: 40.05 Gy
Reference Point Dosage Given to Date: 21.36 Gy
Reference Point Session Dosage Given: 2.67 Gy
Session Number: 8

## 2023-08-13 ENCOUNTER — Other Ambulatory Visit: Payer: Self-pay

## 2023-08-13 ENCOUNTER — Ambulatory Visit
Admission: RE | Admit: 2023-08-13 | Discharge: 2023-08-13 | Disposition: A | Discharge status: Home / Self Care | Payer: PPO | Source: Ambulatory Visit | Attending: Radiation Oncology | Admitting: Radiation Oncology

## 2023-08-13 DIAGNOSIS — Z510 Encounter for antineoplastic radiation therapy: Secondary | ICD-10-CM | POA: Insufficient documentation

## 2023-08-13 DIAGNOSIS — C50412 Malignant neoplasm of upper-outer quadrant of left female breast: Secondary | ICD-10-CM | POA: Insufficient documentation

## 2023-08-13 DIAGNOSIS — Z170 Estrogen receptor positive status [ER+]: Secondary | ICD-10-CM | POA: Diagnosis not present

## 2023-08-13 LAB — RAD ONC ARIA SESSION SUMMARY
Course Elapsed Days: 11
Plan Fractions Treated to Date: 9
Plan Prescribed Dose Per Fraction: 2.67 Gy
Plan Total Fractions Prescribed: 15
Plan Total Prescribed Dose: 40.05 Gy
Reference Point Dosage Given to Date: 24.03 Gy
Reference Point Session Dosage Given: 2.67 Gy
Session Number: 9

## 2023-08-16 ENCOUNTER — Other Ambulatory Visit: Payer: Self-pay

## 2023-08-16 ENCOUNTER — Ambulatory Visit: Payer: PPO

## 2023-08-16 ENCOUNTER — Ambulatory Visit
Admission: RE | Admit: 2023-08-16 | Discharge: 2023-08-16 | Disposition: A | Discharge status: Home / Self Care | Payer: PPO | Source: Ambulatory Visit | Attending: Radiation Oncology | Admitting: Radiation Oncology

## 2023-08-16 ENCOUNTER — Ambulatory Visit: Payer: PPO | Admitting: Radiation Oncology

## 2023-08-16 DIAGNOSIS — Z170 Estrogen receptor positive status [ER+]: Secondary | ICD-10-CM | POA: Diagnosis not present

## 2023-08-16 DIAGNOSIS — C50412 Malignant neoplasm of upper-outer quadrant of left female breast: Secondary | ICD-10-CM | POA: Diagnosis not present

## 2023-08-16 DIAGNOSIS — Z510 Encounter for antineoplastic radiation therapy: Secondary | ICD-10-CM | POA: Diagnosis not present

## 2023-08-16 LAB — RAD ONC ARIA SESSION SUMMARY
Course Elapsed Days: 14
Plan Fractions Treated to Date: 10
Plan Prescribed Dose Per Fraction: 2.67 Gy
Plan Total Fractions Prescribed: 15
Plan Total Prescribed Dose: 40.05 Gy
Reference Point Dosage Given to Date: 26.7 Gy
Reference Point Session Dosage Given: 2.67 Gy
Session Number: 10

## 2023-08-17 ENCOUNTER — Ambulatory Visit: Payer: PPO

## 2023-08-17 ENCOUNTER — Other Ambulatory Visit: Payer: Self-pay

## 2023-08-17 ENCOUNTER — Ambulatory Visit
Admission: RE | Admit: 2023-08-17 | Discharge: 2023-08-17 | Disposition: A | Discharge status: Home / Self Care | Payer: PPO | Source: Ambulatory Visit | Attending: Radiation Oncology

## 2023-08-17 DIAGNOSIS — Z170 Estrogen receptor positive status [ER+]: Secondary | ICD-10-CM | POA: Diagnosis not present

## 2023-08-17 DIAGNOSIS — C50412 Malignant neoplasm of upper-outer quadrant of left female breast: Secondary | ICD-10-CM | POA: Diagnosis not present

## 2023-08-17 DIAGNOSIS — Z510 Encounter for antineoplastic radiation therapy: Secondary | ICD-10-CM | POA: Diagnosis not present

## 2023-08-17 LAB — RAD ONC ARIA SESSION SUMMARY
Course Elapsed Days: 15
Plan Fractions Treated to Date: 11
Plan Prescribed Dose Per Fraction: 2.67 Gy
Plan Total Fractions Prescribed: 15
Plan Total Prescribed Dose: 40.05 Gy
Reference Point Dosage Given to Date: 29.37 Gy
Reference Point Session Dosage Given: 2.67 Gy
Session Number: 11

## 2023-08-18 ENCOUNTER — Other Ambulatory Visit: Payer: Self-pay

## 2023-08-18 ENCOUNTER — Ambulatory Visit
Admission: RE | Admit: 2023-08-18 | Discharge: 2023-08-18 | Disposition: A | Discharge status: Home / Self Care | Payer: PPO | Source: Ambulatory Visit | Attending: Radiation Oncology | Admitting: Radiation Oncology

## 2023-08-18 DIAGNOSIS — C50412 Malignant neoplasm of upper-outer quadrant of left female breast: Secondary | ICD-10-CM | POA: Diagnosis not present

## 2023-08-18 DIAGNOSIS — Z510 Encounter for antineoplastic radiation therapy: Secondary | ICD-10-CM | POA: Diagnosis not present

## 2023-08-18 DIAGNOSIS — Z170 Estrogen receptor positive status [ER+]: Secondary | ICD-10-CM | POA: Diagnosis not present

## 2023-08-18 LAB — RAD ONC ARIA SESSION SUMMARY
Course Elapsed Days: 16
Plan Fractions Treated to Date: 12
Plan Prescribed Dose Per Fraction: 2.67 Gy
Plan Total Fractions Prescribed: 15
Plan Total Prescribed Dose: 40.05 Gy
Reference Point Dosage Given to Date: 32.04 Gy
Reference Point Session Dosage Given: 2.67 Gy
Session Number: 12

## 2023-08-19 ENCOUNTER — Other Ambulatory Visit: Payer: Self-pay

## 2023-08-19 ENCOUNTER — Ambulatory Visit
Admission: RE | Admit: 2023-08-19 | Discharge: 2023-08-19 | Disposition: A | Discharge status: Home / Self Care | Payer: PPO | Source: Ambulatory Visit | Attending: Radiation Oncology | Admitting: Radiation Oncology

## 2023-08-19 DIAGNOSIS — C50412 Malignant neoplasm of upper-outer quadrant of left female breast: Secondary | ICD-10-CM | POA: Diagnosis not present

## 2023-08-19 DIAGNOSIS — Z510 Encounter for antineoplastic radiation therapy: Secondary | ICD-10-CM | POA: Diagnosis not present

## 2023-08-19 DIAGNOSIS — Z170 Estrogen receptor positive status [ER+]: Secondary | ICD-10-CM | POA: Diagnosis not present

## 2023-08-19 LAB — RAD ONC ARIA SESSION SUMMARY
Course Elapsed Days: 17
Plan Fractions Treated to Date: 13
Plan Prescribed Dose Per Fraction: 2.67 Gy
Plan Total Fractions Prescribed: 15
Plan Total Prescribed Dose: 40.05 Gy
Reference Point Dosage Given to Date: 34.71 Gy
Reference Point Session Dosage Given: 2.67 Gy
Session Number: 13

## 2023-08-20 ENCOUNTER — Ambulatory Visit
Admission: RE | Admit: 2023-08-20 | Discharge: 2023-08-20 | Disposition: A | Discharge status: Home / Self Care | Payer: PPO | Source: Ambulatory Visit | Attending: Radiation Oncology

## 2023-08-20 ENCOUNTER — Other Ambulatory Visit: Payer: Self-pay

## 2023-08-20 ENCOUNTER — Ambulatory Visit: Payer: PPO

## 2023-08-20 DIAGNOSIS — C50412 Malignant neoplasm of upper-outer quadrant of left female breast: Secondary | ICD-10-CM | POA: Diagnosis not present

## 2023-08-20 DIAGNOSIS — Z170 Estrogen receptor positive status [ER+]: Secondary | ICD-10-CM | POA: Diagnosis not present

## 2023-08-20 DIAGNOSIS — Z510 Encounter for antineoplastic radiation therapy: Secondary | ICD-10-CM | POA: Diagnosis not present

## 2023-08-20 LAB — RAD ONC ARIA SESSION SUMMARY
Course Elapsed Days: 18
Plan Fractions Treated to Date: 14
Plan Prescribed Dose Per Fraction: 2.67 Gy
Plan Total Fractions Prescribed: 15
Plan Total Prescribed Dose: 40.05 Gy
Reference Point Dosage Given to Date: 37.38 Gy
Reference Point Session Dosage Given: 2.67 Gy
Session Number: 14

## 2023-08-23 ENCOUNTER — Ambulatory Visit: Payer: PPO

## 2023-08-23 ENCOUNTER — Ambulatory Visit
Admission: RE | Admit: 2023-08-23 | Discharge: 2023-08-23 | Disposition: A | Discharge status: Home / Self Care | Payer: PPO | Source: Ambulatory Visit | Attending: Radiation Oncology | Admitting: Radiation Oncology

## 2023-08-23 ENCOUNTER — Ambulatory Visit: Payer: PPO | Admitting: Hematology and Oncology

## 2023-08-23 ENCOUNTER — Other Ambulatory Visit: Payer: Self-pay

## 2023-08-23 DIAGNOSIS — C50412 Malignant neoplasm of upper-outer quadrant of left female breast: Secondary | ICD-10-CM | POA: Diagnosis not present

## 2023-08-23 DIAGNOSIS — Z170 Estrogen receptor positive status [ER+]: Secondary | ICD-10-CM | POA: Diagnosis not present

## 2023-08-23 DIAGNOSIS — Z510 Encounter for antineoplastic radiation therapy: Secondary | ICD-10-CM | POA: Diagnosis not present

## 2023-08-23 LAB — RAD ONC ARIA SESSION SUMMARY
Course Elapsed Days: 21
Plan Fractions Treated to Date: 15
Plan Prescribed Dose Per Fraction: 2.67 Gy
Plan Total Fractions Prescribed: 15
Plan Total Prescribed Dose: 40.05 Gy
Reference Point Dosage Given to Date: 40.05 Gy
Reference Point Session Dosage Given: 2.67 Gy
Session Number: 15

## 2023-08-24 ENCOUNTER — Ambulatory Visit: Payer: PPO

## 2023-08-24 ENCOUNTER — Ambulatory Visit
Admission: RE | Admit: 2023-08-24 | Discharge: 2023-08-24 | Disposition: A | Discharge status: Home / Self Care | Payer: PPO | Source: Ambulatory Visit | Attending: Radiation Oncology | Admitting: Radiation Oncology

## 2023-08-24 ENCOUNTER — Other Ambulatory Visit: Payer: Self-pay

## 2023-08-24 DIAGNOSIS — C50412 Malignant neoplasm of upper-outer quadrant of left female breast: Secondary | ICD-10-CM | POA: Diagnosis not present

## 2023-08-24 DIAGNOSIS — Z510 Encounter for antineoplastic radiation therapy: Secondary | ICD-10-CM | POA: Diagnosis not present

## 2023-08-24 DIAGNOSIS — Z170 Estrogen receptor positive status [ER+]: Secondary | ICD-10-CM | POA: Diagnosis not present

## 2023-08-24 LAB — RAD ONC ARIA SESSION SUMMARY
Course Elapsed Days: 22
Plan Fractions Treated to Date: 1
Plan Prescribed Dose Per Fraction: 2 Gy
Plan Total Fractions Prescribed: 5
Plan Total Prescribed Dose: 10 Gy
Reference Point Dosage Given to Date: 2 Gy
Reference Point Session Dosage Given: 2 Gy
Session Number: 16

## 2023-08-25 ENCOUNTER — Ambulatory Visit: Payer: PPO

## 2023-08-25 ENCOUNTER — Other Ambulatory Visit: Payer: Self-pay

## 2023-08-25 ENCOUNTER — Ambulatory Visit
Admission: RE | Admit: 2023-08-25 | Discharge: 2023-08-25 | Disposition: A | Discharge status: Home / Self Care | Payer: PPO | Source: Ambulatory Visit | Attending: Radiation Oncology

## 2023-08-25 DIAGNOSIS — Z510 Encounter for antineoplastic radiation therapy: Secondary | ICD-10-CM | POA: Diagnosis not present

## 2023-08-25 LAB — RAD ONC ARIA SESSION SUMMARY
Course Elapsed Days: 23
Plan Fractions Treated to Date: 2
Plan Prescribed Dose Per Fraction: 2 Gy
Plan Total Fractions Prescribed: 5
Plan Total Prescribed Dose: 10 Gy
Reference Point Dosage Given to Date: 4 Gy
Reference Point Session Dosage Given: 2 Gy
Session Number: 17

## 2023-08-26 ENCOUNTER — Other Ambulatory Visit: Payer: Self-pay

## 2023-08-26 ENCOUNTER — Ambulatory Visit
Admission: RE | Admit: 2023-08-26 | Discharge: 2023-08-26 | Disposition: A | Discharge status: Home / Self Care | Payer: PPO | Source: Ambulatory Visit | Attending: Radiation Oncology | Admitting: Radiation Oncology

## 2023-08-26 DIAGNOSIS — Z510 Encounter for antineoplastic radiation therapy: Secondary | ICD-10-CM | POA: Diagnosis not present

## 2023-08-26 LAB — RAD ONC ARIA SESSION SUMMARY
Course Elapsed Days: 24
Plan Fractions Treated to Date: 3
Plan Prescribed Dose Per Fraction: 2 Gy
Plan Total Fractions Prescribed: 5
Plan Total Prescribed Dose: 10 Gy
Reference Point Dosage Given to Date: 6 Gy
Reference Point Session Dosage Given: 2 Gy
Session Number: 18

## 2023-08-27 ENCOUNTER — Ambulatory Visit
Admission: RE | Admit: 2023-08-27 | Discharge: 2023-08-27 | Disposition: A | Discharge status: Home / Self Care | Payer: PPO | Source: Ambulatory Visit | Attending: Radiation Oncology | Admitting: Radiation Oncology

## 2023-08-27 ENCOUNTER — Ambulatory Visit: Payer: PPO

## 2023-08-27 ENCOUNTER — Other Ambulatory Visit: Payer: Self-pay

## 2023-08-27 DIAGNOSIS — M6281 Muscle weakness (generalized): Secondary | ICD-10-CM | POA: Diagnosis not present

## 2023-08-27 DIAGNOSIS — Z510 Encounter for antineoplastic radiation therapy: Secondary | ICD-10-CM | POA: Diagnosis not present

## 2023-08-27 DIAGNOSIS — M5416 Radiculopathy, lumbar region: Secondary | ICD-10-CM | POA: Diagnosis not present

## 2023-08-27 LAB — RAD ONC ARIA SESSION SUMMARY
Course Elapsed Days: 25
Plan Fractions Treated to Date: 4
Plan Prescribed Dose Per Fraction: 2 Gy
Plan Total Fractions Prescribed: 5
Plan Total Prescribed Dose: 10 Gy
Reference Point Dosage Given to Date: 8 Gy
Reference Point Session Dosage Given: 2 Gy
Session Number: 19

## 2023-08-30 ENCOUNTER — Other Ambulatory Visit: Payer: Self-pay

## 2023-08-30 ENCOUNTER — Ambulatory Visit
Admission: RE | Admit: 2023-08-30 | Discharge: 2023-08-30 | Disposition: A | Discharge status: Home / Self Care | Payer: PPO | Source: Ambulatory Visit | Attending: Radiation Oncology

## 2023-08-30 ENCOUNTER — Ambulatory Visit
Admission: RE | Admit: 2023-08-30 | Discharge: 2023-08-30 | Disposition: A | Discharge status: Home / Self Care | Payer: PPO | Source: Ambulatory Visit | Attending: Radiation Oncology | Admitting: Radiation Oncology

## 2023-08-30 ENCOUNTER — Ambulatory Visit: Payer: PPO

## 2023-08-30 DIAGNOSIS — Z510 Encounter for antineoplastic radiation therapy: Secondary | ICD-10-CM | POA: Diagnosis not present

## 2023-08-30 DIAGNOSIS — C50412 Malignant neoplasm of upper-outer quadrant of left female breast: Secondary | ICD-10-CM | POA: Diagnosis not present

## 2023-08-30 DIAGNOSIS — Z170 Estrogen receptor positive status [ER+]: Secondary | ICD-10-CM | POA: Diagnosis not present

## 2023-08-30 LAB — RAD ONC ARIA SESSION SUMMARY
Course Elapsed Days: 28
Plan Fractions Treated to Date: 5
Plan Prescribed Dose Per Fraction: 2 Gy
Plan Total Fractions Prescribed: 5
Plan Total Prescribed Dose: 10 Gy
Reference Point Dosage Given to Date: 10 Gy
Reference Point Session Dosage Given: 2 Gy
Session Number: 20

## 2023-08-31 ENCOUNTER — Ambulatory Visit: Payer: PPO

## 2023-08-31 NOTE — Radiation Completion Notes (Signed)
Patient Name: Regina [name], Regina [name] MRN: [id number] Date of Birth: 17-Jul-1941 Referring Physician: [name], M.D. Date of Service: 2023-08-31 Radiation Oncologist: Lonie Peak, M.D. [facility] [place]                             RADIATION ONCOLOGY END OF TREATMENT NOTE     Diagnosis: C50.412 Malignant neoplasm of upper-outer quadrant of left female breast Staging on 2023-06-02: Malignant neoplasm of upper-outer quadrant of left breast in female, estrogen receptor positive (HCC) T=cT2, N=cN0, M=cM0 Intent: Curative     ==========DELIVERED PLANS==========  First Treatment Date: 2023-08-02 - Last Treatment Date: 2023-08-30   Plan Name: Breast_L_BH Site: Breast, Left Technique: 3D Mode: Photon Dose Per Fraction: 2.67 Gy Prescribed Dose (Delivered / Prescribed): 40.05 Gy / 40.05 Gy Prescribed Fxs (Delivered / Prescribed): 15 / 15   Plan Name: Brst_L_Bst_BH Site: Breast, Left Technique: 3D Mode: Photon Dose Per Fraction: 2 Gy Prescribed Dose (Delivered / Prescribed): 10 Gy / 10 Gy Prescribed Fxs (Delivered / Prescribed): 5 / 5     ==========ON TREATMENT VISIT DATES========== 2023-08-02, 2023-08-09, 2023-08-17, 2023-08-23, 2023-08-30     ==========UPCOMING VISITS========== 2023-10-20 No Location Listed Wait List No Provider Listed        ==========APPENDIX - ON TREATMENT VISIT NOTES==========   See weekly On Treatment Notes in Epic for details.

## 2023-09-06 ENCOUNTER — Other Ambulatory Visit: Payer: Self-pay | Admitting: Cardiology

## 2023-09-06 ENCOUNTER — Inpatient Hospital Stay: Payer: PPO | Attending: Hematology and Oncology | Admitting: Hematology and Oncology

## 2023-09-06 VITALS — BP 120/71 | HR 89 | Temp 97.9°F | Resp 18 | Ht 63.0 in | Wt 122.7 lb

## 2023-09-06 DIAGNOSIS — G43109 Migraine with aura, not intractable, without status migrainosus: Secondary | ICD-10-CM | POA: Diagnosis not present

## 2023-09-06 DIAGNOSIS — Z79810 Long term (current) use of selective estrogen receptor modulators (SERMs): Secondary | ICD-10-CM | POA: Insufficient documentation

## 2023-09-06 DIAGNOSIS — C50412 Malignant neoplasm of upper-outer quadrant of left female breast: Secondary | ICD-10-CM | POA: Insufficient documentation

## 2023-09-06 DIAGNOSIS — Z79899 Other long term (current) drug therapy: Secondary | ICD-10-CM | POA: Diagnosis not present

## 2023-09-06 DIAGNOSIS — Z170 Estrogen receptor positive status [ER+]: Secondary | ICD-10-CM | POA: Insufficient documentation

## 2023-09-06 DIAGNOSIS — M810 Age-related osteoporosis without current pathological fracture: Secondary | ICD-10-CM | POA: Diagnosis not present

## 2023-09-06 DIAGNOSIS — Z923 Personal history of irradiation: Secondary | ICD-10-CM | POA: Insufficient documentation

## 2023-09-06 MED ORDER — TAMOXIFEN CITRATE 10 MG PO TABS: 10.0000 mg | ORAL_TABLET | Freq: Every day | ORAL | 3 refills | Status: DC

## 2023-09-06 NOTE — Assessment & Plan Note (Addendum)
05/19/2023:Screening mammogram detected left breast mass UOQ 2.2 cm (in 2021 patient had CSL: Went on surveillance), by ultrasound measured 1.6 cm at 1 o'clock position (entire area with CSL 2.6 cm), axilla negative, biopsy grade 2 IDC ER 80%, PR 0%, Ki67 30%, HER2 negative by [facility]    06/17/2023: Left lumpectomy: Grade 3 IDC 1.8 cm with DCIS high-grade negative, lymphovascular invasion not identified, ER 80%, PR 0%, HER2 negative by FISH, Ki-67 30%     Treatment plan:  Adjuvant radiation therapy 08/06/2023-08/30/2023 2.  Adjuvant antiestrogen therapy to start 09/12/2023 tamoxifen was chosen because of severe osteoporosis.    Tamoxifen counseling: We discussed the risks and benefits of tamoxifen. These include but not limited to insomnia, hot flashes, mood changes, vaginal dryness, and weight gain. Although rare, serious side effects including endometrial cancer, risk of blood clots were also discussed. We strongly believe that the benefits far outweigh the risks. Patient understands these risks and consented to starting treatment. Planned treatment duration is 5 years.   She has a vague history of blood clots in the eye for which she takes aspirin 3 times a week.  Is unclear to me if that is contraindication.  Since she is doing clinically very well I recommended that we start tamoxifen and keep her at 10 mg dosage.   Patient's husband Regina [name]) is also one of my patients. Return to clinic in 3 months for survivorship care plan visit

## 2023-09-06 NOTE — Progress Notes (Signed)
Patient Care Team: [name], MD as PCP - General (Family Medicine) Donnelly Angelica, RN as Oncology Nurse Navigator Pershing Proud, RN as Oncology Nurse Navigator Serena Croissant, MD as Consulting Physician (Hematology and Oncology) Lonie Peak, MD as Attending Physician (Radiation Oncology) Griselda Miner, MD as Consulting Physician (General Surgery)  DIAGNOSIS:  Encounter Diagnosis  Name Primary?   Malignant neoplasm of upper-outer quadrant of left breast in female, estrogen receptor positive (HCC) Yes    SUMMARY OF ONCOLOGIC HISTORY: Oncology History  Malignant neoplasm of upper-outer quadrant of left breast in female, estrogen receptor positive (HCC)  05/19/2023 Initial Diagnosis   Screening mammogram detected left breast mass UOQ 2.2 cm (in 2021 patient had CSL: Went on surveillance), by ultrasound measured 1.6 cm at 1 o'clock position (entire area with CSL 2.6 cm), axilla negative, biopsy grade 2 IDC ER 80%, PR 0%, Ki67 30%, HER2 negative by [facility]   06/02/2023 Cancer Staging   Staging form: Breast, AJCC 8th Edition - Clinical: Stage IB (cT2, cN0, cM0, G2, ER+, PR+, HER2-) - Signed by Serena Croissant, MD on 06/02/2023 Stage prefix: Initial diagnosis Histologic grading system: 3 grade system   06/12/2023 Genetic Testing   Negative [facility] CustomNext-Cancer +RNA Panel.  Report date is 06/12/2023.   The CustomNext-Cancer+RNAinsight panel offered by [facility] includes sequencing and rearrangement analysis for the following 38 genes:  APC, ATM, BAP1, BARD1, BMPR1A, BRCA1, BRCA2, BRIP1, CDH1, CDK4, CDKN2A, CHEK2, DICER1, MLH1, MSH2, MSH6, MUTYH, NF1, NTHL1, PALB2, PMS2, POT1, PTEN, RAD51C, RAD51D, RB1, SMAD4, SMARCA4, STK11 and TP53 (sequencing and deletion/duplication); AXIN2, HOXB13, MITF, MSH3, POLD1 and POLE (sequencing only); EPCAM and GREM1 (deletion/duplication only). RNA data is routinely analyzed for use in variant interpretation for all genes.   06/17/2023 Surgery   Left  lumpectomy: Grade 3 IDC 1.8 cm with DCIS high-grade negative, lymphovascular invasion not identified, ER 80%, PR 0%, HER2 negative by FISH, Ki-67 30%     CHIEF COMPLIANT: Follow-up after radiation was completed  HISTORY OF PRESENT ILLNESS:  History of Present Illness   The patient, with a history of breast cancer, presents for a follow-up visit after completing treatment. She expresses interest in hormone therapy to reduce the risk of recurrence. The patient's cancer cells had estrogen receptors, making antiestrogen pills a potential option for reducing distant recurrence.  The patient also has a history of osteoporosis, which is severe enough to warrant medication, but she has not been able to take it due to ongoing dental work and implants. She expresses concern about the potential for bone loss with the proposed hormone therapy.  In addition, the patient has a history of migraines, which she believes may be caused by tiny blood clots in the optic vessels. She has been managing this condition with baby aspirin three times a week.  The patient is currently on omeprazole, atorvastatin, and Myrbetriq.         ALLERGIES:  is allergic to nsaids, ketoprofen, tramadol, macrodantin [nitrofurantoin macrocrystal], oxycodone, and venlafaxine.  MEDICATIONS:  Current Outpatient Medications  Medication Sig Dispense Refill   tamoxifen (NOLVADEX) 10 MG tablet Take 1 tablet (10 mg total) by mouth daily. 90 tablet 3   albuterol (VENTOLIN HFA) 108 (90 Base) MCG/ACT inhaler Inhale into the lungs every 6 (six) hours as needed for wheezing or shortness of breath.     ALPRAZOLAM PO Take by mouth as needed.     Ascorbic Acid (VITAMIN C) 1000 MG tablet Take 1,000 mg by mouth daily.  aspirin 81 MG EC tablet Take by mouth 2 (two) times a week.     atorvastatin (LIPITOR) 10 MG tablet TAKE 1 TABLET BY MOUTH DAILY 90 tablet 2   b complex vitamins capsule Take 1 capsule by mouth daily.     calcium  citrate-vitamin D (CITRACAL+D) 315-200 MG-UNIT tablet Take 1 tablet by mouth every morning.     Cholecalciferol (VITAMIN D3) 2000 units TABS Take 2,000 Units by mouth daily.     fluticasone (FLONASE) 50 MCG/ACT nasal spray Place into both nostrils daily.     Fluticasone Furoate (ARNUITY ELLIPTA) 100 MCG/ACT AEPB Inhale into the lungs.     Lutein 6 MG CAPS Take 1 capsule by mouth with breakfast, with lunch, and with evening meal.     Lysine 1000 MG TABS Take 1 tablet by mouth daily.     Magnesium 500 MG CAPS Take 1 capsule by mouth daily.     mirabegron ER (MYRBETRIQ) 50 MG TB24 tablet Take 1 tablet by mouth daily.     Omega-3 Fatty Acids (OMEGA 3 500 PO) Take 1 tablet by mouth daily at 6 (six) AM.     omeprazole (PRILOSEC) 40 MG capsule Take 40 mg by mouth daily.     Zinc Sulfate (ZINC-220 PO) Take 1 tablet by mouth daily at 6 (six) AM.     No current facility-administered medications for this visit.    PHYSICAL EXAMINATION: ECOG PERFORMANCE STATUS: 1 - Symptomatic but completely ambulatory  Vitals:   09/06/23 1332  BP: 120/71  Pulse: 89  Resp: 18  Temp: 97.9 F (36.6 C)  SpO2: 99%   Filed Weights   09/06/23 1332  Weight: 122 lb 11.2 oz (55.7 kg)      LABORATORY DATA:  I have reviewed the data as listed    Latest Ref Rng & Units 06/02/2023   12:22 PM 11/26/2022    8:41 AM 05/01/2022   12:08 PM  CMP  Glucose 70 - 99 mg/dL 096  92  81   BUN 8 - 23 mg/dL 17  17  15    Creatinine 0.44 - 1.00 mg/dL 0.45  4.09  8.11   Sodium 135 - 145 mmol/L 140  143  141   Potassium 3.5 - 5.1 mmol/L 4.4  3.9  3.9   Chloride 98 - 111 mmol/L 105  104  108   CO2 22 - 32 mmol/L 32  26  23   Calcium 8.9 - 10.3 mg/dL 9.2  8.9  8.8   Total Protein 6.5 - 8.1 g/dL 6.6     Total Bilirubin 0.3 - 1.2 mg/dL 0.6     Alkaline Phos 38 - 126 U/L 66     AST 15 - 41 U/L 24     ALT 0 - 44 U/L 24       Lab Results  Component Value Date   WBC 6.5 06/02/2023   HGB 13.7 06/02/2023   HCT 42.4 06/02/2023    MCV 94.2 06/02/2023   PLT 178 06/02/2023   NEUTROABS 3.9 06/02/2023    ASSESSMENT & PLAN:  Malignant neoplasm of upper-outer quadrant of left breast in female, estrogen receptor positive (HCC) 05/19/2023:Screening mammogram detected left breast mass UOQ 2.2 cm (in 2021 patient had CSL: Went on surveillance), by ultrasound measured 1.6 cm at 1 o'clock position (entire area with CSL 2.6 cm), axilla negative, biopsy grade 2 IDC ER 80%, PR 0%, Ki67 30%, HER2 [facility]    06/17/2023: Left lumpectomy: Grade 3  IDC 1.8 cm with DCIS high-grade negative, lymphovascular invasion not identified, ER 80%, PR 0%, HER2 negative by FISH, Ki-67 30%     Treatment plan:  Adjuvant radiation therapy 08/06/2023-08/30/2023 2.  Adjuvant antiestrogen therapy to start 09/12/2023 tamoxifen was chosen because of severe osteoporosis.    Tamoxifen counseling: We discussed the risks and benefits of tamoxifen. These include but not limited to insomnia, hot flashes, mood changes, vaginal dryness, and weight gain. Although rare, serious side effects including endometrial cancer, risk of blood clots were also discussed. We strongly believe that the benefits far outweigh the risks. Patient understands these risks and consented to starting treatment. Planned treatment duration is 5 years.   She has a vague history of blood clots in the eye for which she takes aspirin 3 times a week.  Is unclear to me if that is contraindication.  Since she is doing clinically very well I recommended that we start tamoxifen and keep her at 10 mg dosage.   Patient's husband Sigmund Hazel) is also one of my patients. Return to clinic in 3 months for survivorship care plan visit ------------------------------------- Assessment and Plan    Breast Cancer Post-surgical and radiation therapy, considering hormone therapy for prevention of distant recurrence. Discussed the benefits and side effects of Anastrozole and Tamoxifen. Given the patient's  history of osteoporosis, decided on Tamoxifen due to its bone density improvement properties, despite potential for increased blood clot risk. -Start Tamoxifen 10mg  daily, monitor for side effects. -Continue current medications including Atorvastatin and Myrbetriq, despite potential interaction with Tamoxifen. -Request T-scores from primary care provider for further assessment of osteoporosis severity.  Ocular Migraines Managed with baby aspirin three times a week, with noted improvement in frequency of migraines. -Continue current regimen of baby aspirin.  General Health Maintenance -Continue annual mammograms. -Schedule follow-up in three months for survivorship appointment and to assess tolerance to Tamoxifen.          No orders of the defined types were placed in this encounter.  The patient has a good understanding of the overall plan. she agrees with it. she will call with any problems that may develop before the next visit here. Total time spent: 30 mins including face to face time and time spent for planning, charting and co-ordination of care   Tamsen Meek, MD 09/06/23

## 2023-09-07 ENCOUNTER — Encounter: Payer: Self-pay | Admitting: *Deleted

## 2023-09-08 DIAGNOSIS — H04123 Dry eye syndrome of bilateral lacrimal glands: Secondary | ICD-10-CM | POA: Diagnosis not present

## 2023-09-08 DIAGNOSIS — H40003 Preglaucoma, unspecified, bilateral: Secondary | ICD-10-CM | POA: Diagnosis not present

## 2023-09-08 DIAGNOSIS — Z83511 Family history of glaucoma: Secondary | ICD-10-CM | POA: Diagnosis not present

## 2023-09-14 ENCOUNTER — Encounter: Payer: Self-pay | Admitting: Family Medicine

## 2023-09-16 DIAGNOSIS — M6281 Muscle weakness (generalized): Secondary | ICD-10-CM | POA: Diagnosis not present

## 2023-09-16 DIAGNOSIS — M5416 Radiculopathy, lumbar region: Secondary | ICD-10-CM | POA: Diagnosis not present

## 2023-09-21 DIAGNOSIS — M6281 Muscle weakness (generalized): Secondary | ICD-10-CM | POA: Diagnosis not present

## 2023-09-21 DIAGNOSIS — M5416 Radiculopathy, lumbar region: Secondary | ICD-10-CM | POA: Diagnosis not present

## 2023-09-22 DIAGNOSIS — M6702 Short Achilles tendon (acquired), left ankle: Secondary | ICD-10-CM | POA: Diagnosis not present

## 2023-09-28 DIAGNOSIS — M6281 Muscle weakness (generalized): Secondary | ICD-10-CM | POA: Diagnosis not present

## 2023-09-28 DIAGNOSIS — M5416 Radiculopathy, lumbar region: Secondary | ICD-10-CM | POA: Diagnosis not present

## 2023-09-30 ENCOUNTER — Ambulatory Visit
Admission: RE | Admit: 2023-09-30 | Discharge: 2023-09-30 | Disposition: A | Discharge status: Home / Self Care | Payer: PPO | Source: Ambulatory Visit | Attending: Radiation Oncology | Admitting: Radiation Oncology

## 2023-09-30 NOTE — Progress Notes (Signed)
Regina [name] is here today for follow up post radiation to the breast.   Breast Side: Left  They completed their radiation on: 08/30/2023   Does the patient complain of any of the following:  Post radiation skin issues: No Breast Tenderness: No Breast Swelling: No Lymphadema: No Range of Motion limitations: No Fatigue post radiation: No Appetite good/fair/poor: No  Additional comments if applicable: Hot flashes is only complaint caused by some medication she's taking.  Other than that everything is fine breast looks normal no redness or tenderness per patient.  Advised to monitor for any changes and to call if she has any new concerns.

## 2023-10-19 ENCOUNTER — Telehealth: Payer: Self-pay | Admitting: *Deleted

## 2023-10-19 NOTE — Telephone Encounter (Signed)
 Dr. Celine Mans and Dr. Delton Coombes, Please advise if ok to change providers.  Thank you.

## 2023-10-20 ENCOUNTER — Encounter: Payer: Self-pay | Admitting: Emergency Medicine

## 2023-10-20 ENCOUNTER — Ambulatory Visit: Payer: PPO | Admitting: Emergency Medicine

## 2023-10-20 VITALS — BP 115/58 | HR 78 | Temp 99.0°F | Ht 63.0 in | Wt 122.6 lb

## 2023-10-20 DIAGNOSIS — K219 Gastro-esophageal reflux disease without esophagitis: Secondary | ICD-10-CM

## 2023-10-20 DIAGNOSIS — R053 Chronic cough: Secondary | ICD-10-CM | POA: Diagnosis not present

## 2023-10-20 DIAGNOSIS — J4520 Mild intermittent asthma, uncomplicated: Secondary | ICD-10-CM | POA: Diagnosis not present

## 2023-10-20 DIAGNOSIS — J310 Chronic rhinitis: Secondary | ICD-10-CM | POA: Diagnosis not present

## 2023-10-20 MED ORDER — MONTELUKAST SODIUM 10 MG PO TABS: 10.0000 mg | ORAL_TABLET | Freq: Every day | ORAL | 3 refills | Status: DC

## 2023-10-20 NOTE — Patient Instructions (Signed)
 Please continue your fluticasone  nasal spray 2 sprays each nostril each evening.  Try to avoid taking it right before bedtime so it will not drain down to your throat Please start loratadine 10 mg once daily.  You can get this over-the-counter (generic Claritin). Please restart montelukast  10 mg each evening.  Will send a prescription for this. Stop Arnuity for now Okay to keep your albuterol available to use 2 puffs if needed for shortness of breath, chest tightness or to help with coughing spells Continue your omeprazole 40 mg once daily. We will perform pulmonary function testing We will perform a CT scan of the chest Follow with Dr. Shelah next available after your testing so we can review those results together

## 2023-10-20 NOTE — Assessment & Plan Note (Signed)
 She has good acid suppression on her current omeprazole.  No changes at this time.

## 2023-10-20 NOTE — Assessment & Plan Note (Signed)
 She has longstanding chronic cough but now with some different characteristics.  More productive of thick mucus, yellow in color.  Certainly her chronic rhinitis is a contributor.  She is on good GERD therapy and pH probe in June confirmed no acid.  She may have some persistent nonacid reflux that could be contributing.  Given the increased mucus production need to evaluate for possible worsening for obstructive lung disease, possible bronchiectasis or other cause for cough.  I will check a CT scan of the chest, PFT and continue to work on treating rhinitis more effectively.  She is only using the Arnuity on an as-needed basis and I like for her to avoid powdered inhalers anyway.  I will stop it for now pending the PFTs

## 2023-10-20 NOTE — Progress Notes (Signed)
 Subjective:    Patient ID: Regina [name], female    DOB: [birth year], 83 y.o.   MRN: [id number]  HPI 83 year old woman with a history of tobacco use (12 pack years), whom I have seen in the past for chronic cough and asthma.  She has chronic allergic rhinitis and GERD which both impacted this.  Suspect there was a component of upper airway irritation syndrome in addition to her bronchospasm.  Last seen in our office by Dr. Meade in May 2023 Past medical history significant for breast cancer, GERD with hiatal hernia, chronic rhinitis, osteoporosis, factor V Leiden heterozygosity. Currently managed on Arnuity but does not use every day, albuterol approximately few times a month - with a fit of coughing.  Omeprazole 40 mg daily, Flonase  each evening, not on Singulair  right now.    Today she reports that her cough is increased, associated with increased mucus production, usually thick and yellow globs. No blood. Still w a lot of clear nasal drainage.   She had a pH probe done in June >> showed good acid suppression on omeprazole 40mg   Her pulmonary function testing from 06/05/2014 reviewed by me show evidence for mild obstruction based on a positive bronchodilator response.  Her FEV1 was 1.82 L or 84% predicted and improved to 2.09 L (14% change)  Chest x-ray 10/27/2022 reviewed by me showed no infiltrates, no evidence of active cardiopulmonary disease   Review of Systems As per HPI  Past Medical History:  Diagnosis Date   Breast cancer (HCC)    Diverticulosis    GERD (gastroesophageal reflux disease)    Headache    Heterozygous factor V Leiden mutation (HCC)    Hiatal hernia    Insomnia    Nervous system disorder    spinal cord tethered   Neurogenic bladder    Osteoporosis    Osteoporosis    Tubular adenoma of colon    Urinary incontinence      Family History  Problem Relation Age of Onset   Asthma Mother    COPD Mother    Stroke Mother        TIA   Osteoporosis Mother     Hypertension Mother    Macular degeneration Mother    Glaucoma Mother    Heart attack Father    CAD Father    Skin cancer Father    CAD Brother    Melanoma Brother        dx >50   Brain cancer Maternal Uncle        dx unknown age   Heart attack Maternal Grandmother    Heart attack Maternal Grandfather    Breast cancer Paternal Grandmother 55   Prostate cancer Paternal Grandfather 70   Migraines Neg Hx    Colon cancer Neg Hx    Colon polyps Neg Hx    Liver disease Neg Hx      Social History   Socioeconomic History   Marital status: Married    Spouse name: John   Number of children: 0   Years of education: Some colle   Highest education level: Not on file  Occupational History   Occupation: Self employed   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: [employer]  Tobacco Use   Smoking status: Former    Current packs/day: 0.00    Average packs/day: 3.0 packs/day for 4.0 years (12.0 ttl pk-yrs)    Types: Cigarettes    Start date: 12/10/1968    Quit date: 12/10/1972  Years since quitting: 50.8   Smokeless tobacco: Never  Substance and Sexual Activity   Alcohol use: Yes    Alcohol/week: 0.0 standard drinks of alcohol    Comment: 2-3 glasses wine/week   Drug use: No   Sexual activity: Not on file  Other Topics Concern   Not on file  Social History Narrative   Lives w/ husband    Caffeine use: 2-3 cups coffee per day   Social Drivers of Health   Financial Resource Strain: Not on file  Food Insecurity: No Food Insecurity (06/11/2023)   Hunger Vital Sign    Worried About Running Out of Food in the Last Year: Never true    Ran Out of Food in the Last Year: Never true  Transportation Needs: No Transportation Needs (06/11/2023)   PRAPARE - Administrator, Civil Service (Medical): No    Lack of Transportation (Non-Medical): No  Physical Activity: Not on file  Stress: Not on file  Social Connections: Not on file  Intimate Partner Violence: Not At Risk  (06/11/2023)   Humiliation, Afraid, Rape, and Kick questionnaire    Fear of Current or Ex-Partner: No    Emotionally Abused: No    Physically Abused: No    Sexually Abused: No     Allergies  Allergen Reactions   Nsaids Anaphylaxis and Other (See Comments)    Severe GI irritation.  Acute Gastritis  Severe GI irritation.   Ketoprofen     Stomach issues   Tramadol Itching   Macrodantin [Nitrofurantoin Macrocrystal] Rash   Oxycodone Itching   Venlafaxine Itching     Outpatient Medications Prior to Visit  Medication Sig Dispense Refill   albuterol (VENTOLIN HFA) 108 (90 Base) MCG/ACT inhaler Inhale into the lungs every 6 (six) hours as needed for wheezing or shortness of breath.     ALPRAZOLAM PO Take by mouth as needed.     Ascorbic Acid (VITAMIN C) 1000 MG tablet Take 1,000 mg by mouth daily.      aspirin 81 MG EC tablet Take 81 mg by mouth 3 (three) times a week.     atorvastatin  (LIPITOR) 10 MG tablet TAKE 1 TABLET BY MOUTH DAILY 90 tablet 2   b complex vitamins capsule Take 1 capsule by mouth daily.     calcium  citrate-vitamin D (CITRACAL+D) 315-200 MG-UNIT tablet Take 1 tablet by mouth every morning.     Cholecalciferol (VITAMIN D3) 2000 units TABS Take 2,000 Units by mouth daily.     fluticasone  (FLONASE ) 50 MCG/ACT nasal spray Place into both nostrils daily.     Fluticasone  Furoate (ARNUITY ELLIPTA) 100 MCG/ACT AEPB Inhale into the lungs.     Lutein 6 MG CAPS Take 1 capsule by mouth with breakfast, with lunch, and with evening meal.     Lysine 1000 MG TABS Take 1 tablet by mouth daily.     Magnesium 500 MG CAPS Take 1 capsule by mouth daily.     mirabegron ER (MYRBETRIQ) 50 MG TB24 tablet Take 1 tablet by mouth daily.     Omega-3 Fatty Acids (OMEGA 3 500 PO) Take 1 tablet by mouth daily at 6 (six) AM.     omeprazole (PRILOSEC) 40 MG capsule Take 40 mg by mouth daily.     tamoxifen  (NOLVADEX ) 10 MG tablet Take 1 tablet (10 mg total) by mouth daily. 90 tablet 3   Zinc  Sulfate (ZINC-220 PO) Take 1 tablet by mouth daily at 6 (six) AM.  No facility-administered medications prior to visit.         Objective:   Physical Exam Vitals:   10/20/23 1335  BP: (!) 115/58  Pulse: 78  Temp: 99 F (37.2 C)  TempSrc: Oral  SpO2: 97%  Weight: 122 lb 9.6 oz (55.6 kg)  Height: 5' 3 (1.6 m)    Gen: Pleasant, well-nourished, in no distress,  normal affect  ENT: No lesions,  mouth clear,  oropharynx clear, no postnasal drip  Neck: No JVD, no stridor  Lungs: No use of accessory muscles, no crackles or wheezing on normal respiration, no wheeze on forced expiration  Cardiovascular: RRR, heart sounds normal, no murmur or gallops, no peripheral edema  Musculoskeletal: No deformities, no cyanosis or clubbing  Neuro: alert, awake, non focal  Skin: Warm, no lesions or rash       Assessment & Plan:  Cough She has longstanding chronic cough but now with some different characteristics.  More productive of thick mucus, yellow in color.  Certainly her chronic rhinitis is a contributor.  She is on good GERD therapy and pH probe in June confirmed no acid.  She may have some persistent nonacid reflux that could be contributing.  Given the increased mucus production need to evaluate for possible worsening for obstructive lung disease, possible bronchiectasis or other cause for cough.  I will check a CT scan of the chest, PFT and continue to work on treating rhinitis more effectively.  She is only using the Arnuity on an as-needed basis and I like for her to avoid powdered inhalers anyway.  I will stop it for now pending the PFTs  Asthma, mild intermittent Plan to stop Arnuity as she is only using it as needed.  She can have albuterol to use if needed.  Full PFT ordered to better characterize her lung disease.  Gastroesophageal reflux disease She has good acid suppression on her current omeprazole.  No changes at this time.  Chronic rhinitis Please continue your  fluticasone  nasal spray 2 sprays each nostril each evening.  Try to avoid taking it right before bedtime so it will not drain down to your throat Please start loratadine 10 mg once daily.  You can get this over-the-counter (generic Claritin). Please restart montelukast  10 mg each evening.  Will send a prescription for this.   Time spent 50 minutes  Lamar Chris, MD, PhD 10/20/2023, 5:08 PM [facility] [phone] or if no answer before 7:00PM call [phone] For any issues after 7:00PM please call eLink [phone]

## 2023-10-20 NOTE — Assessment & Plan Note (Signed)
 Plan to stop Arnuity as she is only using it as needed.  She can have albuterol to use if needed.  Full PFT ordered to better characterize her lung disease.

## 2023-10-20 NOTE — Telephone Encounter (Signed)
  w/ me

## 2023-10-20 NOTE — Assessment & Plan Note (Signed)
 Please continue your fluticasone  nasal spray 2 sprays each nostril each evening.  Try to avoid taking it right before bedtime so it will not drain down to your throat Please start loratadine 10 mg once daily.  You can get this over-the-counter (generic Claritin). Please restart montelukast  10 mg each evening.  Will send a prescription for this.

## 2023-10-21 ENCOUNTER — Telehealth: Payer: Self-pay | Admitting: *Deleted

## 2023-10-21 NOTE — Telephone Encounter (Signed)
 Pt called with c/o leg cramps and felt they were coming from Tamoxifen. Advised that Tamoxifen does not cause leg cramps. Recommended pt to drink tonic water as well as take magnesium glycinate 1-2 tabs nightly. Pt verbalized understanding.

## 2023-10-26 ENCOUNTER — Other Ambulatory Visit: Payer: Self-pay | Admitting: General Surgery

## 2023-10-26 DIAGNOSIS — N644 Mastodynia: Secondary | ICD-10-CM

## 2023-11-02 ENCOUNTER — Ambulatory Visit
Admission: RE | Admit: 2023-11-02 | Discharge: 2023-11-02 | Disposition: A | Discharge status: Home / Self Care | Payer: PPO | Source: Ambulatory Visit | Attending: Emergency Medicine | Admitting: Emergency Medicine

## 2023-11-02 DIAGNOSIS — R053 Chronic cough: Secondary | ICD-10-CM

## 2023-11-04 ENCOUNTER — Encounter: Payer: Self-pay | Admitting: General Surgery

## 2023-11-04 DIAGNOSIS — N644 Mastodynia: Secondary | ICD-10-CM

## 2023-12-09 ENCOUNTER — Ambulatory Visit (HOSPITAL_COMMUNITY)
Admission: RE | Admit: 2023-12-09 | Discharge: 2023-12-09 | Disposition: A | Discharge status: Home / Self Care | Payer: PPO | Source: Ambulatory Visit | Attending: Adult Health | Admitting: Adult Health

## 2023-12-09 ENCOUNTER — Other Ambulatory Visit: Payer: Self-pay | Admitting: *Deleted

## 2023-12-09 ENCOUNTER — Encounter: Payer: Self-pay | Admitting: Adult Health

## 2023-12-09 ENCOUNTER — Inpatient Hospital Stay: Payer: PPO | Attending: Adult Health | Admitting: Adult Health

## 2023-12-09 VITALS — BP 117/61 | HR 72 | Temp 97.9°F | Resp 18 | Ht 63.0 in | Wt 122.7 lb

## 2023-12-09 DIAGNOSIS — Z170 Estrogen receptor positive status [ER+]: Secondary | ICD-10-CM

## 2023-12-09 DIAGNOSIS — Z886 Allergy status to analgesic agent status: Secondary | ICD-10-CM | POA: Diagnosis not present

## 2023-12-09 DIAGNOSIS — M79605 Pain in left leg: Secondary | ICD-10-CM | POA: Insufficient documentation

## 2023-12-09 DIAGNOSIS — Z79899 Other long term (current) drug therapy: Secondary | ICD-10-CM | POA: Insufficient documentation

## 2023-12-09 DIAGNOSIS — Z825 Family history of asthma and other chronic lower respiratory diseases: Secondary | ICD-10-CM | POA: Insufficient documentation

## 2023-12-09 DIAGNOSIS — C50412 Malignant neoplasm of upper-outer quadrant of left female breast: Secondary | ICD-10-CM | POA: Insufficient documentation

## 2023-12-09 DIAGNOSIS — Z8269 Family history of other diseases of the musculoskeletal system and connective tissue: Secondary | ICD-10-CM | POA: Diagnosis not present

## 2023-12-09 DIAGNOSIS — Z808 Family history of malignant neoplasm of other organs or systems: Secondary | ICD-10-CM | POA: Insufficient documentation

## 2023-12-09 DIAGNOSIS — Z860101 Personal history of adenomatous and serrated colon polyps: Secondary | ICD-10-CM | POA: Insufficient documentation

## 2023-12-09 DIAGNOSIS — Z885 Allergy status to narcotic agent status: Secondary | ICD-10-CM | POA: Diagnosis not present

## 2023-12-09 DIAGNOSIS — R232 Flushing: Secondary | ICD-10-CM | POA: Diagnosis not present

## 2023-12-09 DIAGNOSIS — K219 Gastro-esophageal reflux disease without esophagitis: Secondary | ICD-10-CM | POA: Diagnosis not present

## 2023-12-09 DIAGNOSIS — Z87891 Personal history of nicotine dependence: Secondary | ICD-10-CM | POA: Diagnosis not present

## 2023-12-09 DIAGNOSIS — Z1732 Human epidermal growth factor receptor 2 negative status: Secondary | ICD-10-CM | POA: Insufficient documentation

## 2023-12-09 DIAGNOSIS — Z90721 Acquired absence of ovaries, unilateral: Secondary | ICD-10-CM | POA: Diagnosis not present

## 2023-12-09 DIAGNOSIS — Z1722 Progesterone receptor negative status: Secondary | ICD-10-CM | POA: Insufficient documentation

## 2023-12-09 DIAGNOSIS — Z83518 Family history of other specified eye disorder: Secondary | ICD-10-CM | POA: Diagnosis not present

## 2023-12-09 DIAGNOSIS — Z803 Family history of malignant neoplasm of breast: Secondary | ICD-10-CM | POA: Insufficient documentation

## 2023-12-09 DIAGNOSIS — Z8249 Family history of ischemic heart disease and other diseases of the circulatory system: Secondary | ICD-10-CM | POA: Insufficient documentation

## 2023-12-09 DIAGNOSIS — Z79810 Long term (current) use of selective estrogen receptor modulators (SERMs): Secondary | ICD-10-CM | POA: Diagnosis not present

## 2023-12-09 DIAGNOSIS — Z823 Family history of stroke: Secondary | ICD-10-CM | POA: Diagnosis not present

## 2023-12-09 DIAGNOSIS — Z8262 Family history of osteoporosis: Secondary | ICD-10-CM | POA: Diagnosis not present

## 2023-12-09 DIAGNOSIS — M79604 Pain in right leg: Secondary | ICD-10-CM

## 2023-12-09 DIAGNOSIS — Z923 Personal history of irradiation: Secondary | ICD-10-CM | POA: Diagnosis not present

## 2023-12-09 DIAGNOSIS — Z83511 Family history of glaucoma: Secondary | ICD-10-CM | POA: Diagnosis not present

## 2023-12-09 DIAGNOSIS — Z8042 Family history of malignant neoplasm of prostate: Secondary | ICD-10-CM | POA: Insufficient documentation

## 2023-12-09 NOTE — Progress Notes (Signed)
 Lower extremity venous duplex completed. Please see CV Procedures for preliminary results.  Shona Simpson, RVT 12/09/23 1:28 PM

## 2023-12-09 NOTE — Progress Notes (Signed)
 SURVIVORSHIP VISIT:  BRIEF ONCOLOGIC HISTORY:  Oncology History  Malignant neoplasm of upper-outer quadrant of left breast in female, estrogen receptor positive (HCC)  05/19/2023 Initial Diagnosis   Screening mammogram detected left breast mass UOQ 2.2 cm (in 2021 patient had CSL: Went on surveillance), by ultrasound measured 1.6 cm at 1 o'clock position (entire area with CSL 2.6 cm), axilla negative, biopsy grade 2 IDC ER 80%, PR 0%, Ki67 30%, HER2 negative by [facility]   06/02/2023 Cancer Staging   Staging form: Breast, AJCC 8th Edition - Clinical: Stage IB (cT2, cN0, cM0, G2, ER+, PR+, HER2-) - Signed by Serena Croissant, MD on 06/02/2023 Stage prefix: Initial diagnosis Histologic grading system: 3 grade system   06/12/2023 Genetic Testing   Negative [facility] CustomNext-Cancer +RNA Panel.  Report date is 06/12/2023.   The CustomNext-Cancer+RNAinsight panel offered by [facility] includes sequencing and rearrangement analysis for the following 38 genes:  APC, ATM, BAP1, BARD1, BMPR1A, BRCA1, BRCA2, BRIP1, CDH1, CDK4, CDKN2A, CHEK2, DICER1, MLH1, MSH2, MSH6, MUTYH, NF1, NTHL1, PALB2, PMS2, POT1, PTEN, RAD51C, RAD51D, RB1, SMAD4, SMARCA4, STK11 and TP53 (sequencing and deletion/duplication); AXIN2, HOXB13, MITF, MSH3, POLD1 and POLE (sequencing only); EPCAM and GREM1 (deletion/duplication only). RNA data is routinely analyzed for use in variant interpretation for all genes.   06/17/2023 Surgery   Left lumpectomy: Grade 3 IDC 1.8 cm with DCIS high-grade negative, lymphovascular invasion not identified, ER 80%, PR 0%, HER2 negative by FISH, Ki-67 30%   08/02/2023 - 08/30/2023 Radiation Therapy   Plan Name: Breast_L_BH Site: Breast, Left Technique: 3D Mode: Photon Dose Per Fraction: 2.67 Gy Prescribed Dose (Delivered / Prescribed): 40.05 Gy / 40.05 Gy Prescribed Fxs (Delivered / Prescribed): 15 / 15   Plan Name: Brst_L_Bst_BH Site: Breast, Left Technique: 3D Mode: Photon Dose Per Fraction: 2  Gy Prescribed Dose (Delivered / Prescribed): 10 Gy / 10 Gy Prescribed Fxs (Delivered / Prescribed): 5 / 5   09/10/2023 -  Anti-estrogen oral therapy   10 mg Tamoxifen x 5 years     INTERVAL HISTORY:  Ms. Regina [name] to review her survivorship care plan detailing her treatment course for breast cancer, as well as monitoring long-term side effects of that treatment, education regarding health maintenance, screening, and overall wellness and health promotion.     Overall, Ms. Regina [name] reports feeling quite well.  He is taking tamoxifen daily and experience hot flashes along with some cramping in her bilateral lower extremities right worse than left.  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  She is traveling monthly and is concerned about the possibility of a blood clot.  She has ensured that she is drinking enough water, she even drinks tonic water.  She is also taking magnesium.  She also notes some left breast fullness and discomfort that has been present more recently.  She underwent a left breast mammogram at [facility] however this did not really image the area where her tenderness is located.  Dr. Carolynne Edouard ordered an ultrasound however this order got sent to the breast center.  REVIEW OF SYSTEMS:  Review of Systems  Constitutional:  Negative for appetite change, chills, fatigue, fever and unexpected weight change.  HENT:   Negative for hearing loss, lump/mass and trouble swallowing.   Eyes:  Negative for eye problems and icterus.  Respiratory:  Negative for chest tightness, cough and shortness of breath.   Cardiovascular:  Negative for chest pain, leg  swelling and palpitations.  Gastrointestinal:  Negative for abdominal distention, abdominal pain, constipation, diarrhea, nausea and vomiting.  Endocrine: Positive for hot flashes.  Genitourinary:  Negative for difficulty urinating.   Musculoskeletal:  Negative  for arthralgias.  Skin:  Negative for itching and rash.  Neurological:  Negative for dizziness, extremity weakness, headaches and numbness.  Hematological:  Negative for adenopathy. Does not bruise/bleed easily.  Psychiatric/Behavioral:  Negative for depression. The patient is not nervous/anxious.    Breast: Denies any new nodularity, masses, tenderness, nipple changes, or nipple discharge.       PAST MEDICAL/SURGICAL HISTORY:  Past Medical History:  Diagnosis Date   Breast cancer (HCC)    Diverticulosis    GERD (gastroesophageal reflux disease)    Headache    Heterozygous factor V Leiden mutation (HCC)    Hiatal hernia    Insomnia    Nervous system disorder    spinal cord tethered   Neurogenic bladder    Osteoporosis    Osteoporosis    Tubular adenoma of colon    Urinary incontinence    Past Surgical History:  Procedure Laterality Date   49 HOUR PH STUDY N/A 03/24/2023   Procedure: 24 HOUR PH STUDY;  Surgeon: Beverley Fiedler, MD;  Location: [facility];  Service: Gastroenterology;  Laterality: N/A;   BREAST LUMPECTOMY WITH RADIOACTIVE SEED LOCALIZATION Left 06/17/2023   Procedure: LEFT BREAST RADIOACTIVE SEED LOCALIZED LUMPECTOMY x2;  Surgeon: Griselda Miner, MD;  [facility];  Service: General;  Laterality: Left;  one seed   dental implants     ESOPHAGEAL MANOMETRY N/A 03/24/2023   Procedure: ESOPHAGEAL MANOMETRY (EM);  Surgeon: Beverley Fiedler, MD;  Location: [facility];  Service: Gastroenterology;  Laterality: N/A;   PARTIAL HYSTERECTOMY  10/12/1994   SALPINGOOPHORECTOMY Right    TUBAL LIGATION       ALLERGIES:  Allergies  Allergen Reactions   Nsaids Anaphylaxis and Other (See Comments)    Severe GI irritation.  Acute Gastritis  Severe GI irritation.   Ketoprofen     Stomach issues   Tramadol Itching   Macrodantin [Nitrofurantoin Macrocrystal] Rash   Oxycodone Itching   Venlafaxine Itching     CURRENT MEDICATIONS:  Outpatient  Encounter Medications as of 12/09/2023  Medication Sig Note   albuterol (VENTOLIN HFA) 108 (90 Base) MCG/ACT inhaler Inhale into the lungs every 6 (six) hours as needed for wheezing or shortness of breath.    ALPRAZOLAM PO Take by mouth as needed.    Ascorbic Acid (VITAMIN C) 1000 MG tablet Take 1,000 mg by mouth daily.     aspirin 81 MG EC tablet Take 81 mg by mouth 3 (three) times a week.    atorvastatin (LIPITOR) 10 MG tablet TAKE 1 TABLET BY MOUTH DAILY    b complex vitamins capsule Take 1 capsule by mouth daily.    calcium citrate-vitamin D (CITRACAL+D) 315-200 MG-UNIT tablet Take 1 tablet by mouth every morning.    Cholecalciferol (VITAMIN D3) 2000 units TABS Take 2,000 Units by mouth daily. 05/04/2022: Patient takes 1000 units daily   fluticasone (FLONASE) 50 MCG/ACT nasal spray Place into both nostrils daily.    Lutein 6 MG CAPS Take 1 capsule by mouth with breakfast, with lunch, and with evening meal.    Lysine 1000 MG TABS Take 1 tablet by mouth daily.    Magnesium 500 MG CAPS Take 1 capsule by mouth daily.    mirabegron ER (MYRBETRIQ) 50 MG TB24 tablet Take 1 tablet  by mouth daily.    montelukast (SINGULAIR) 10 MG tablet Take 1 tablet (10 mg total) by mouth at bedtime.    Omega-3 Fatty Acids (OMEGA 3 500 PO) Take 1 tablet by mouth daily at 6 (six) AM.    omeprazole (PRILOSEC) 40 MG capsule Take 40 mg by mouth daily.    tamoxifen (NOLVADEX) 10 MG tablet Take 1 tablet (10 mg total) by mouth daily.    Zinc Sulfate (ZINC-220 PO) Take 1 tablet by mouth daily at 6 (six) AM.    [DISCONTINUED] Fluticasone Furoate (ARNUITY ELLIPTA) 100 MCG/ACT AEPB Inhale into the lungs.    No facility-administered encounter medications on file as of 12/09/2023.     ONCOLOGIC FAMILY HISTORY:  Family History  Problem Relation Age of Onset   Asthma Mother    COPD Mother    Stroke Mother        TIA   Osteoporosis Mother    Hypertension Mother    Macular degeneration Mother    Glaucoma Mother    Heart  attack Father    CAD Father    Skin cancer Father    CAD Brother    Melanoma Brother        dx >50   Brain cancer Maternal Uncle        dx unknown age   Heart attack Maternal Grandmother    Heart attack Maternal Grandfather    Breast cancer Paternal Grandmother 42   Prostate cancer Paternal Grandfather 13   Migraines Neg Hx    Colon cancer Neg Hx    Colon polyps Neg Hx    Liver disease Neg Hx      SOCIAL HISTORY:  Social History   Socioeconomic History   Marital status: Married    Spouse name: John   Number of children: 0   Years of education: Some colle   Highest education level: Not on file  Occupational History   Occupation: Self employed   Occupation: Network engineer OF Leisure centre manager: [employer]  Tobacco Use   Smoking status: Former    Current packs/day: 0.00    Average packs/day: 3.0 packs/day for 4.0 years (12.0 ttl pk-yrs)    Types: Cigarettes    Start date: 12/10/1968    Quit date: 12/10/1972    Years since quitting: 51.0   Smokeless tobacco: Never  Substance and Sexual Activity   Alcohol use: Yes    Alcohol/week: 0.0 standard drinks of alcohol    Comment: 2-3 glasses wine/week   Drug use: No   Sexual activity: Not on file  Other Topics Concern   Not on file  Social History Narrative   Lives w/ husband    Caffeine use: 2-3 cups coffee per day   Social Drivers of Health   Financial Resource Strain: Not on file  Food Insecurity: No Food Insecurity (06/11/2023)   Hunger Vital Sign    Worried About Running Out of Food in the Last Year: Never true    Ran Out of Food in the Last Year: Never true  Transportation Needs: No Transportation Needs (06/11/2023)   PRAPARE - Administrator, Civil Service (Medical): No    Lack of Transportation (Non-Medical): No  Physical Activity: Not on file  Stress: Not on file  Social Connections: Not on file  Intimate Partner Violence: Not At Risk (06/11/2023)   Humiliation, Afraid, Rape, and Kick questionnaire     Fear of Current or Ex-Partner: No    Emotionally Abused: No  Physically Abused: No    Sexually Abused: No     OBSERVATIONS/OBJECTIVE:  BP 117/61 (Patient Position: Sitting)   Pulse 72   Temp 97.9 F (36.6 C) (Temporal)   Resp 18   Ht 5\' 3"  (1.6 m)   Wt 122 lb 11.2 oz (55.7 kg)   LMP 08/01/2014   SpO2 99%   BMI 21.74 kg/m  GENERAL: Patient is a well appearing female in no acute distress HEENT:  Sclerae anicteric.  Oropharynx clear and moist. No ulcerations or evidence of oropharyngeal candidiasis. Neck is supple.  NODES:  No cervical, supraclavicular, or axillary lymphadenopathy palpated.  BREAST EXAM:  left breast s/p lumpectomy and radiation, no sign of local recurrence, fullness noted in the left lateral breast with focal tenderness.  Right breast benign LUNGS:  Clear to auscultation bilaterally.  No wheezes or rhonchi. HEART:  Regular rate and rhythm. No murmur appreciated. ABDOMEN:  Soft, nontender.  Positive, normoactive bowel sounds. No organomegaly palpated. MSK:  No focal spinal tenderness to palpation. Full range of motion bilaterally in the upper extremities. EXTREMITIES:  No peripheral edema.   SKIN:  Clear with no obvious rashes or skin changes. No nail dyscrasia. NEURO:  Nonfocal. Well oriented.  Appropriate affect.   LABORATORY DATA:  None for this visit.  DIAGNOSTIC IMAGING:  None for this visit.      ASSESSMENT AND PLAN:  Ms.. Regina [name] is a pleasant 83 y.o. female with Stage IA left breast invasive ductal carcinoma, ER+/PR-/HER2-, diagnosed in 05/2023, treated with lumpectomy, adjuvant radiation therapy, and anti-estrogen therapy with Tamoxifen beginning in 08/2023.  She presents to the Survivorship Clinic for our initial meeting and routine follow-up post-completion of treatment for breast cancer.    1. Stage IA left breast cancer:  Ms. Kuba is continuing to recover from definitive treatment for breast cancer. She will follow-up with her medical  oncologist, Dr.  Pamelia Hoit in 6 months with history and physical exam per surveillance protocol.  She will continue her anti-estrogen therapy with Tamoxifen. Thus far, she is tolerating the Tamoxifen well, with minimal side effects. Her mammogram is due 05/2024; orders placed today.   Today, a comprehensive survivorship care plan and treatment summary was reviewed with the patient today detailing her breast cancer diagnosis, treatment course, potential late/long-term effects of treatment, appropriate follow-up care with recommendations for the future, and patient education resources.  A copy of this summary, along with a letter will be sent to the patient's primary care provider via mail/fax/In Basket message after today's visit.    2.  Left lateral focal breast tenderness: Will obtain ultrasound.  If the ultrasound is negative I counseled Benna Dunks that we could refer her to physical therapy as they can teach her massage technique to help mobilize any fluid in her breast to alleviate any discomfort.  My nurse is reentering an order for [facility] and will fax it over to their office so they can get this scheduled for the patient.  3.  Bilateral leg cramping right greater than left: We will obtain bilateral lower extremity Doppler to rule out DVT in the setting of frequent travel and tamoxifen use.  4. Bone health:  She has not yet undergone bone density testing as orderedShe was given education on specific activities to promote bone health.  5. Cancer screening:  Due to Ms. Dreese's history and her age, she should receive screening for skin cancer.  The information and recommendations are listed on the patient's comprehensive care plan/treatment summary and were reviewed in  detail with the patient.    6. Health maintenance and wellness promotion: Ms. Latka was encouraged to consume 5-7 servings of fruits and vegetables per day. We reviewed the "Nutrition Rainbow" handout.  She was also encouraged to engage in  moderate to vigorous exercise for 30 minutes per day most days of the week.  She was instructed to limit her alcohol consumption and continue to abstain from tobacco use.     7. Support services/counseling: It is not uncommon for this period of the patient's cancer care trajectory to be one of many emotions and stressors.   She was given information regarding our available services and encouraged to contact me with any questions or for help enrolling in any of our support group/programs.    Follow up instructions:    -Return to cancer center in 6 months for f/u with Dr. Pamelia Hoit  -Mammogram due in 05/2024 -She is welcome to return back to the Survivorship Clinic at any time; no additional follow-up needed at this time.  -Consider referral back to survivorship as a long-term survivor for continued surveillance  The patient was provided an opportunity to ask questions and all were answered. The patient agreed with the plan and demonstrated an understanding of the instructions.   Total encounter time:45 minutes*in face-to-face visit time, chart review, lab review, care coordination, order entry, and documentation of the encounter time.    Lillard Anes, NP 12/09/23 11:22 AM Medical Oncology and Hematology [facility] [address] [place], [place] [address] Tel. [phone]    Fax. [phone]  *Total Encounter Time as defined by the Centers for Medicare and Medicaid Services includes, in addition to the face-to-face time of a patient visit (documented in the note above) non-face-to-face time: obtaining and reviewing outside history, ordering and reviewing medications, tests or procedures, care coordination (communications with other health care professionals or caregivers) and documentation in the medical record.

## 2023-12-10 ENCOUNTER — Encounter: Payer: Self-pay | Admitting: Adult Health

## 2023-12-10 DIAGNOSIS — H524 Presbyopia: Secondary | ICD-10-CM | POA: Diagnosis not present

## 2023-12-10 DIAGNOSIS — H52203 Unspecified astigmatism, bilateral: Secondary | ICD-10-CM | POA: Diagnosis not present

## 2023-12-22 DIAGNOSIS — M545 Low back pain, unspecified: Secondary | ICD-10-CM | POA: Diagnosis not present

## 2023-12-30 DIAGNOSIS — N644 Mastodynia: Secondary | ICD-10-CM | POA: Diagnosis not present

## 2023-12-30 DIAGNOSIS — Z853 Personal history of malignant neoplasm of breast: Secondary | ICD-10-CM | POA: Diagnosis not present

## 2024-01-03 DIAGNOSIS — R7301 Impaired fasting glucose: Secondary | ICD-10-CM | POA: Diagnosis not present

## 2024-01-04 ENCOUNTER — Ambulatory Visit: Payer: PPO | Admitting: Emergency Medicine

## 2024-01-06 ENCOUNTER — Encounter: Payer: Self-pay | Admitting: Hematology and Oncology

## 2024-01-14 DIAGNOSIS — K047 Periapical abscess without sinus: Secondary | ICD-10-CM | POA: Diagnosis not present

## 2024-01-20 ENCOUNTER — Encounter: Payer: Self-pay | Admitting: Emergency Medicine

## 2024-01-20 ENCOUNTER — Ambulatory Visit: Payer: PPO | Admitting: Emergency Medicine

## 2024-01-20 VITALS — BP 125/76 | HR 76 | Ht 63.0 in | Wt 125.4 lb

## 2024-01-20 DIAGNOSIS — J4520 Mild intermittent asthma, uncomplicated: Secondary | ICD-10-CM | POA: Diagnosis not present

## 2024-01-20 DIAGNOSIS — J310 Chronic rhinitis: Secondary | ICD-10-CM | POA: Diagnosis not present

## 2024-01-20 DIAGNOSIS — R918 Other nonspecific abnormal finding of lung field: Secondary | ICD-10-CM

## 2024-01-20 NOTE — Assessment & Plan Note (Signed)
 Please continue montelukast 10 mg each evening Continue your loratadine 10 mg daily Start using your fluticasone nasal spray, 2 sprays each nostril twice a day.  Try to avoid taking this right before bedtime because we do not want it to drain to your throat. Use your azelastine (Astelin) nasal spray 2 sprays each nostril 2-3 times daily if you need it for nasal congestion and drainage Follow with APP in 3 months  Follow Dr. Delton Coombes in 6 months, sooner if any problems

## 2024-01-20 NOTE — Assessment & Plan Note (Addendum)
 She is done well off Arnuity, rarely needs albuterol.  Her most significant problem is unrelenting congestion, drainage and cough.  I do not think this is her asthma.  Plan to discontinue her pulmonary function testing planned for next month.

## 2024-01-20 NOTE — Assessment & Plan Note (Signed)
 Subtle groundglass nodular opacities in the right upper lobe of unclear significance.  Do not believe this is related to her cough but should follow for stability.  Plan to repeat in 1 year

## 2024-01-20 NOTE — Patient Instructions (Addendum)
 Keep your albuterol available to use 2 puffs if needed for shortness of breath, chest tightness, wheezing. We will not restart Arnuity at this time. We could hold off on repeating your pulmonary function testing.  We will cancel that test for next month. Please continue montelukast 10 mg each evening Continue your loratadine 10 mg daily Start using your fluticasone nasal spray, 2 sprays each nostril twice a day.  Try to avoid taking this right before bedtime because we do not want it to drain to your throat. Use your azelastine (Astelin) nasal spray 2 sprays each nostril 2-3 times daily if you need it for nasal congestion and drainage We will plan to repeat your CT scan of the chest in 1 year. Follow with APP in 3 months  Follow Dr. Delton Coombes in 6 months, sooner if any problems

## 2024-01-20 NOTE — Progress Notes (Signed)
 Subjective:    Patient ID: Regina [name], female    DOB: [birth year], 83 y.o.   MRN: [id number]  HPI 82 year old woman with a history of tobacco use (12 pack years), whom I have seen in the past for chronic cough and asthma.  She has chronic allergic rhinitis and GERD which both impacted this.  Suspect there was a component of upper airway irritation syndrome in addition to her bronchospasm.  Last seen in our office by Dr. Celine Mans in May 2023 Past medical history significant for breast cancer, GERD with hiatal hernia, chronic rhinitis, osteoporosis, factor V Leiden heterozygosity. Currently managed on Arnuity but does not use every day, albuterol approximately few times a month - with a fit of coughing.  Omeprazole 40 mg daily, Flonase each evening, not on Singulair right now.    Today she reports that her cough is increased, associated with increased mucus production, usually thick and yellow globs. No blood. Still w a lot of clear nasal drainage.   She had a pH probe done in June >> showed good acid suppression on omeprazole 40mg   Her pulmonary function testing from 06/05/2014 reviewed by me show evidence for mild obstruction based on a positive bronchodilator response.  Her FEV1 was 1.82 L or 84% predicted and improved to 2.09 L (14% change)  Chest x-ray 10/27/2022 reviewed by me showed no infiltrates, no evidence of active cardiopulmonary disease   ROV 01/20/2024 --follow-up visit for 83 year old woman with a history of tobacco use, chronic cough and asthma in the setting of chronic rhinitis and GERD.  I saw her in January.  Her cough was still active even on good reflux therapy.  I stopped her Arnuity and plan for pulmonary function testing and a CT chest.  We also restarted montelukast in addition to her Flonase and Claritin. She still has a lot of mucous draining to her throat - coughs. No dyspnea. She did not miss the arnuity, is not having SOB. She is on amoxicillin currently due to lost  crown and oral infection.   CT scan of the chest 11/02/2023 reviewed by me, shows subtle scattered groundglass nodular opacities bilaterally but principally in the right upper lobe, calcified granulomas, splenic and hepatic calcifications.  More focal area of groundglass or scarring in the anterior left upper lobe question due to radiation change  Pulmonary function testing ordered but has not been done   Review of Systems As per HPI  Past Medical History:  Diagnosis Date   Breast cancer (HCC)    Diverticulosis    GERD (gastroesophageal reflux disease)    Headache    Heterozygous factor V Leiden mutation (HCC)    Hiatal hernia    Insomnia    Nervous system disorder    spinal cord tethered   Neurogenic bladder    Osteoporosis    Osteoporosis    Tubular adenoma of colon    Urinary incontinence      Family History  Problem Relation Age of Onset   Asthma Mother    COPD Mother    Stroke Mother        TIA   Osteoporosis Mother    Hypertension Mother    Macular degeneration Mother    Glaucoma Mother    Heart attack Father    CAD Father    Skin cancer Father    CAD Brother    Melanoma Brother        dx >50   Brain cancer Maternal Uncle  dx unknown age   Heart attack Maternal Grandmother    Heart attack Maternal Grandfather    Breast cancer Paternal Grandmother 22   Prostate cancer Paternal Grandfather 66   Migraines Neg Hx    Colon cancer Neg Hx    Colon polyps Neg Hx    Liver disease Neg Hx      Social History   Socioeconomic History   Marital status: Married    Spouse name: John   Number of children: 0   Years of education: Some colle   Highest education level: Not on file  Occupational History   Occupation: Self employed   Occupation: Network engineer OF Leisure centre manager: [employer]  Tobacco Use   Smoking status: Former    Current packs/day: 0.00    Average packs/day: 3.0 packs/day for 4.0 years (12.0 ttl pk-yrs)    Types: Cigarettes    Start date:  12/10/1968    Quit date: 12/10/1972    Years since quitting: 51.1   Smokeless tobacco: Never  Substance and Sexual Activity   Alcohol use: Yes    Alcohol/week: 0.0 standard drinks of alcohol    Comment: 2-3 glasses wine/week   Drug use: No   Sexual activity: Not on file  Other Topics Concern   Not on file  Social History Narrative   Lives w/ husband    Caffeine use: 2-3 cups coffee per day   Social Drivers of Health   Financial Resource Strain: Not on file  Food Insecurity: No Food Insecurity (06/11/2023)   Hunger Vital Sign    Worried About Running Out of Food in the Last Year: Never true    Ran Out of Food in the Last Year: Never true  Transportation Needs: No Transportation Needs (06/11/2023)   PRAPARE - Administrator, Civil Service (Medical): No    Lack of Transportation (Non-Medical): No  Physical Activity: Not on file  Stress: Not on file  Social Connections: Not on file  Intimate Partner Violence: Not At Risk (06/11/2023)   Humiliation, Afraid, Rape, and Kick questionnaire    Fear of Current or Ex-Partner: No    Emotionally Abused: No    Physically Abused: No    Sexually Abused: No     Allergies  Allergen Reactions   Nsaids Anaphylaxis and Other (See Comments)    Severe GI irritation.  Acute Gastritis  Severe GI irritation.   Ketoprofen     Stomach issues   Tramadol Itching   Macrodantin [Nitrofurantoin Macrocrystal] Rash   Oxycodone Itching   Venlafaxine Itching     Outpatient Medications Prior to Visit  Medication Sig Dispense Refill   ALPRAZOLAM PO Take by mouth as needed.     amoxicillin (AMOXIL) 875 MG tablet Take 875 mg by mouth 2 (two) times daily.     Ascorbic Acid (VITAMIN C) 1000 MG tablet Take 1,000 mg by mouth daily.      aspirin 81 MG EC tablet Take 81 mg by mouth 3 (three) times a week.     atorvastatin (LIPITOR) 10 MG tablet TAKE 1 TABLET BY MOUTH DAILY 90 tablet 2   Azelastine HCl 137 MCG/SPRAY SOLN Place 137 mcg into both  nostrils in the morning and at bedtime.     b complex vitamins capsule Take 1 capsule by mouth daily.     calcium citrate-vitamin D (CITRACAL+D) 315-200 MG-UNIT tablet Take 1 tablet by mouth every morning.     Cholecalciferol (VITAMIN D3) 2000 units TABS Take  2,000 Units by mouth daily.     fluticasone (FLONASE SENSIMIST) 27.5 MCG/SPRAY nasal spray Place 2 sprays into the nose daily.     Lutein 6 MG CAPS Take 1 capsule by mouth with breakfast, with lunch, and with evening meal.     Lysine 1000 MG TABS Take 1 tablet by mouth daily.     Magnesium 500 MG CAPS Take 1 capsule by mouth daily.     methocarbamol (ROBAXIN) 500 MG tablet Take 500 mg by mouth every 6 (six) hours as needed for muscle spasms.     mirabegron ER (MYRBETRIQ) 50 MG TB24 tablet Take 1 tablet by mouth daily.     montelukast (SINGULAIR) 10 MG tablet Take 1 tablet (10 mg total) by mouth at bedtime. 90 tablet 3   Omega-3 Fatty Acids (OMEGA 3 500 PO) Take 1 tablet by mouth daily at 6 (six) AM.     omeprazole (PRILOSEC) 40 MG capsule Take 40 mg by mouth daily.     tamoxifen (NOLVADEX) 10 MG tablet Take 1 tablet (10 mg total) by mouth daily. 90 tablet 3   Zinc Sulfate (ZINC-220 PO) Take 1 tablet by mouth daily at 6 (six) AM.     albuterol (VENTOLIN HFA) 108 (90 Base) MCG/ACT inhaler Inhale into the lungs every 6 (six) hours as needed for wheezing or shortness of breath. (Patient not taking: Reported on 01/20/2024)     fluticasone (FLONASE) 50 MCG/ACT nasal spray Place into both nostrils daily. (Patient not taking: Reported on 01/20/2024)     No facility-administered medications prior to visit.         Objective:   Physical Exam Vitals:   01/20/24 1438  BP: 125/76  Pulse: 76  SpO2: 98%  Weight: 125 lb 6.4 oz (56.9 kg)  Height: 5\' 3"  (1.6 m)     Gen: Pleasant, well-nourished, in no distress,  normal affect  ENT: No lesions,  mouth clear,  oropharynx clear, no postnasal drip  Neck: No JVD, no stridor  Lungs: No use of  accessory muscles, no crackles or wheezing on normal respiration, no wheeze on forced expiration  Cardiovascular: RRR, heart sounds normal, no murmur or gallops, no peripheral edema  Musculoskeletal: No deformities, no cyanosis or clubbing  Neuro: alert, awake, non focal  Skin: Warm, no lesions or rash       Assessment & Plan:  Pulmonary nodules Subtle groundglass nodular opacities in the right upper lobe of unclear significance.  Do not believe this is related to her cough but should follow for stability.  Plan to repeat in 1 year  Asthma, mild intermittent She is done well off Arnuity, rarely needs albuterol.  Her most significant problem is unrelenting congestion, drainage and cough.  I do not think this is her asthma.  Plan to discontinue her pulmonary function testing planned for next month.  Chronic rhinitis Please continue montelukast 10 mg each evening Continue your loratadine 10 mg daily Start using your fluticasone nasal spray, 2 sprays each nostril twice a day.  Try to avoid taking this right before bedtime because we do not want it to drain to your throat. Use your azelastine (Astelin) nasal spray 2 sprays each nostril 2-3 times daily if you need it for nasal congestion and drainage Follow with APP in 3 months  Follow Dr. Delton Coombes in 6 months, sooner if any problems    Time spent 30 minutes  Levy Pupa, MD, PhD 01/20/2024, 2:55 PM [facility] [phone] or if no  answer before 7:00PM call [phone] For any issues after 7:00PM please call eLink [phone]

## 2024-01-21 DIAGNOSIS — N644 Mastodynia: Secondary | ICD-10-CM | POA: Diagnosis not present

## 2024-01-21 DIAGNOSIS — H6691 Otitis media, unspecified, right ear: Secondary | ICD-10-CM | POA: Diagnosis not present

## 2024-01-21 DIAGNOSIS — K120 Recurrent oral aphthae: Secondary | ICD-10-CM | POA: Diagnosis not present

## 2024-01-21 DIAGNOSIS — Z6822 Body mass index (BMI) 22.0-22.9, adult: Secondary | ICD-10-CM | POA: Diagnosis not present

## 2024-01-21 DIAGNOSIS — J328 Other chronic sinusitis: Secondary | ICD-10-CM | POA: Diagnosis not present

## 2024-02-03 DIAGNOSIS — G9589 Other specified diseases of spinal cord: Secondary | ICD-10-CM | POA: Diagnosis not present

## 2024-02-03 DIAGNOSIS — M545 Low back pain, unspecified: Secondary | ICD-10-CM | POA: Diagnosis not present

## 2024-02-03 DIAGNOSIS — M461 Sacroiliitis, not elsewhere classified: Secondary | ICD-10-CM | POA: Diagnosis not present

## 2024-02-10 ENCOUNTER — Encounter (HOSPITAL_BASED_OUTPATIENT_CLINIC_OR_DEPARTMENT_OTHER)

## 2024-02-11 DIAGNOSIS — M461 Sacroiliitis, not elsewhere classified: Secondary | ICD-10-CM | POA: Diagnosis not present

## 2024-02-24 ENCOUNTER — Encounter: Payer: Self-pay | Admitting: Emergency Medicine

## 2024-02-25 DIAGNOSIS — U071 COVID-19: Secondary | ICD-10-CM | POA: Diagnosis not present

## 2024-02-28 NOTE — Telephone Encounter (Signed)
 When did the symptoms start? If it is within 5 days then we could consider trying an anti-viral med that would shorten the duration

## 2024-02-29 NOTE — Telephone Encounter (Signed)
 Okay thank you.  I think she is doing all the right things.

## 2024-03-01 ENCOUNTER — Encounter (HOSPITAL_BASED_OUTPATIENT_CLINIC_OR_DEPARTMENT_OTHER): Payer: Self-pay | Admitting: Cardiology

## 2024-03-01 DIAGNOSIS — Z6821 Body mass index (BMI) 21.0-21.9, adult: Secondary | ICD-10-CM | POA: Diagnosis not present

## 2024-03-01 DIAGNOSIS — R399 Unspecified symptoms and signs involving the genitourinary system: Secondary | ICD-10-CM | POA: Diagnosis not present

## 2024-03-01 DIAGNOSIS — R000 Tachycardia, unspecified: Secondary | ICD-10-CM | POA: Diagnosis not present

## 2024-03-02 ENCOUNTER — Encounter

## 2024-03-02 DIAGNOSIS — N319 Neuromuscular dysfunction of bladder, unspecified: Secondary | ICD-10-CM | POA: Diagnosis not present

## 2024-03-02 DIAGNOSIS — N1330 Unspecified hydronephrosis: Secondary | ICD-10-CM | POA: Diagnosis not present

## 2024-03-02 DIAGNOSIS — N3946 Mixed incontinence: Secondary | ICD-10-CM | POA: Diagnosis not present

## 2024-03-02 NOTE — Telephone Encounter (Signed)
 Is heart rate staying elevated or just briefly going high?  Has she seen PCP for treatment of UTI?

## 2024-03-08 NOTE — Telephone Encounter (Signed)
 If heart rate improving with treatment of UTI was likely due to UTI and would hold off on any cardiac workup at this time

## 2024-03-14 DIAGNOSIS — H40003 Preglaucoma, unspecified, bilateral: Secondary | ICD-10-CM | POA: Diagnosis not present

## 2024-03-15 DIAGNOSIS — L720 Epidermal cyst: Secondary | ICD-10-CM | POA: Diagnosis not present

## 2024-03-15 DIAGNOSIS — D1801 Hemangioma of skin and subcutaneous tissue: Secondary | ICD-10-CM | POA: Diagnosis not present

## 2024-03-15 DIAGNOSIS — L821 Other seborrheic keratosis: Secondary | ICD-10-CM | POA: Diagnosis not present

## 2024-03-15 DIAGNOSIS — D225 Melanocytic nevi of trunk: Secondary | ICD-10-CM | POA: Diagnosis not present

## 2024-03-15 DIAGNOSIS — L570 Actinic keratosis: Secondary | ICD-10-CM | POA: Diagnosis not present

## 2024-03-15 DIAGNOSIS — Z85828 Personal history of other malignant neoplasm of skin: Secondary | ICD-10-CM | POA: Diagnosis not present

## 2024-03-15 DIAGNOSIS — D2261 Melanocytic nevi of right upper limb, including shoulder: Secondary | ICD-10-CM | POA: Diagnosis not present

## 2024-03-16 DIAGNOSIS — M461 Sacroiliitis, not elsewhere classified: Secondary | ICD-10-CM | POA: Diagnosis not present

## 2024-03-16 DIAGNOSIS — M4120 Other idiopathic scoliosis, site unspecified: Secondary | ICD-10-CM | POA: Diagnosis not present

## 2024-03-30 DIAGNOSIS — M7602 Gluteal tendinitis, left hip: Secondary | ICD-10-CM | POA: Diagnosis not present

## 2024-04-05 DIAGNOSIS — M7602 Gluteal tendinitis, left hip: Secondary | ICD-10-CM | POA: Diagnosis not present

## 2024-04-07 DIAGNOSIS — M25531 Pain in right wrist: Secondary | ICD-10-CM | POA: Diagnosis not present

## 2024-04-12 DIAGNOSIS — M7602 Gluteal tendinitis, left hip: Secondary | ICD-10-CM | POA: Diagnosis not present

## 2024-04-13 ENCOUNTER — Other Ambulatory Visit (HOSPITAL_COMMUNITY): Payer: Self-pay | Admitting: Family Medicine

## 2024-04-13 ENCOUNTER — Other Ambulatory Visit (HOSPITAL_BASED_OUTPATIENT_CLINIC_OR_DEPARTMENT_OTHER): Payer: Self-pay | Admitting: Family Medicine

## 2024-04-13 ENCOUNTER — Ambulatory Visit: Admitting: Nurse Practitioner

## 2024-04-13 ENCOUNTER — Telehealth: Payer: Self-pay | Admitting: Emergency Medicine

## 2024-04-13 ENCOUNTER — Ambulatory Visit (HOSPITAL_BASED_OUTPATIENT_CLINIC_OR_DEPARTMENT_OTHER)
Admission: RE | Admit: 2024-04-13 | Discharge: 2024-04-13 | Disposition: A | Discharge status: Home / Self Care | Source: Ambulatory Visit | Attending: Family Medicine | Admitting: Family Medicine

## 2024-04-13 ENCOUNTER — Other Ambulatory Visit (HOSPITAL_BASED_OUTPATIENT_CLINIC_OR_DEPARTMENT_OTHER)

## 2024-04-13 DIAGNOSIS — S52571A Other intraarticular fracture of lower end of right radius, initial encounter for closed fracture: Secondary | ICD-10-CM | POA: Diagnosis not present

## 2024-04-13 DIAGNOSIS — S6991XD Unspecified injury of right wrist, hand and finger(s), subsequent encounter: Secondary | ICD-10-CM | POA: Diagnosis not present

## 2024-04-13 DIAGNOSIS — Z6821 Body mass index (BMI) 21.0-21.9, adult: Secondary | ICD-10-CM | POA: Diagnosis not present

## 2024-04-13 DIAGNOSIS — S52351A Displaced comminuted fracture of shaft of radius, right arm, initial encounter for closed fracture: Secondary | ICD-10-CM | POA: Diagnosis not present

## 2024-04-13 NOTE — Telephone Encounter (Signed)
 PT came in for appt today but she missed it due to wrote down wrong time.She says her cough is almost gone thanks to an air purifier starting to be used., In about 3 days most all of cough was gone. Does he still want to see her for a FU? Please contact PT to advise.

## 2024-04-18 DIAGNOSIS — M7602 Gluteal tendinitis, left hip: Secondary | ICD-10-CM | POA: Diagnosis not present

## 2024-04-19 NOTE — Telephone Encounter (Signed)
 Per lov note RB wanted pt to return in 3 mths with APP. Please schedule pt with APP for follow-up.

## 2024-04-19 NOTE — Telephone Encounter (Signed)
 LM for PT to call for appt.

## 2024-05-08 DIAGNOSIS — M25531 Pain in right wrist: Secondary | ICD-10-CM | POA: Diagnosis not present

## 2024-05-09 DIAGNOSIS — Z1231 Encounter for screening mammogram for malignant neoplasm of breast: Secondary | ICD-10-CM | POA: Diagnosis not present

## 2024-05-10 DIAGNOSIS — X58XXXA Exposure to other specified factors, initial encounter: Secondary | ICD-10-CM | POA: Diagnosis not present

## 2024-05-10 DIAGNOSIS — S52571A Other intraarticular fracture of lower end of right radius, initial encounter for closed fracture: Secondary | ICD-10-CM | POA: Diagnosis not present

## 2024-05-10 DIAGNOSIS — Y999 Unspecified external cause status: Secondary | ICD-10-CM | POA: Diagnosis not present

## 2024-05-23 DIAGNOSIS — M4120 Other idiopathic scoliosis, site unspecified: Secondary | ICD-10-CM | POA: Diagnosis not present

## 2024-05-23 DIAGNOSIS — M533 Sacrococcygeal disorders, not elsewhere classified: Secondary | ICD-10-CM | POA: Diagnosis not present

## 2024-05-23 DIAGNOSIS — M7602 Gluteal tendinitis, left hip: Secondary | ICD-10-CM | POA: Diagnosis not present

## 2024-05-23 DIAGNOSIS — M461 Sacroiliitis, not elsewhere classified: Secondary | ICD-10-CM | POA: Diagnosis not present

## 2024-05-25 DIAGNOSIS — S52571D Other intraarticular fracture of lower end of right radius, subsequent encounter for closed fracture with routine healing: Secondary | ICD-10-CM | POA: Diagnosis not present

## 2024-05-25 DIAGNOSIS — M79641 Pain in right hand: Secondary | ICD-10-CM | POA: Diagnosis not present

## 2024-05-30 ENCOUNTER — Other Ambulatory Visit: Payer: Self-pay | Admitting: Cardiology

## 2024-05-30 DIAGNOSIS — M25531 Pain in right wrist: Secondary | ICD-10-CM | POA: Diagnosis not present

## 2024-05-31 DIAGNOSIS — D692 Other nonthrombocytopenic purpura: Secondary | ICD-10-CM | POA: Diagnosis not present

## 2024-05-31 DIAGNOSIS — L570 Actinic keratosis: Secondary | ICD-10-CM | POA: Diagnosis not present

## 2024-05-31 DIAGNOSIS — B353 Tinea pedis: Secondary | ICD-10-CM | POA: Diagnosis not present

## 2024-05-31 DIAGNOSIS — L905 Scar conditions and fibrosis of skin: Secondary | ICD-10-CM | POA: Diagnosis not present

## 2024-05-31 DIAGNOSIS — L821 Other seborrheic keratosis: Secondary | ICD-10-CM | POA: Diagnosis not present

## 2024-05-31 DIAGNOSIS — L814 Other melanin hyperpigmentation: Secondary | ICD-10-CM | POA: Diagnosis not present

## 2024-05-31 DIAGNOSIS — Z85828 Personal history of other malignant neoplasm of skin: Secondary | ICD-10-CM | POA: Diagnosis not present

## 2024-05-31 DIAGNOSIS — L718 Other rosacea: Secondary | ICD-10-CM | POA: Diagnosis not present

## 2024-06-01 DIAGNOSIS — M25531 Pain in right wrist: Secondary | ICD-10-CM | POA: Diagnosis not present

## 2024-06-06 ENCOUNTER — Encounter: Payer: Self-pay | Admitting: Adult Health

## 2024-06-06 ENCOUNTER — Ambulatory Visit: Admitting: Adult Health

## 2024-06-06 VITALS — BP 110/76 | HR 77 | Temp 98.4°F | Ht 63.0 in | Wt 122.8 lb

## 2024-06-06 DIAGNOSIS — M25531 Pain in right wrist: Secondary | ICD-10-CM | POA: Diagnosis not present

## 2024-06-06 DIAGNOSIS — J4520 Mild intermittent asthma, uncomplicated: Secondary | ICD-10-CM

## 2024-06-06 DIAGNOSIS — J310 Chronic rhinitis: Secondary | ICD-10-CM

## 2024-06-06 DIAGNOSIS — R053 Chronic cough: Secondary | ICD-10-CM

## 2024-06-06 DIAGNOSIS — R918 Other nonspecific abnormal finding of lung field: Secondary | ICD-10-CM | POA: Diagnosis not present

## 2024-06-06 NOTE — Progress Notes (Signed)
 "  @Patient  ID: Regina [name], [name]    DOB: [birth year], 83 y.o.   MRN: [id number]  Chief Complaint  Patient presents with   Cough    Referring provider: Aisha Harvey, MD  HPI: 83 yo [name] followed for mild intermittent asthma, chronic allergic rhintis   TEST/EVENTS :   Discussed the use of AI scribe software for clinical note transcription with the patient, who gave verbal consent to proceed.  History of Present Illness Regina [name] is an 83 year old [name] with chronic cough, lung nodules and Asthma presents for a 4 month follow up   She has a long-standing history of chronic cough, asthma, and sinus drainage, with recent production of 'sticky globs' of mucus, described as almost solid and gum-like, for about a year. The mucus is clear, and she reports that it collects in her throat, leading to coughing fits when enough accumulates.  She is currently using montelukast  regularlyShe uses nasal sprays like Flonase  and Azelastine intermittently, particularly at night, to manage her symptoms. Additionally, she uses an over-the-counter bromelain supplement, which she feels helps with her symptoms. An air purifier in her bedroom is believed to be beneficial. She has not been using saline nasal rinses.  Her past medical history includes breast cancer, for which she underwent surgery and radiation    She travels frequently for work, soil scientist, and notes that her symptoms sometimes improve when traveling, although she recently experienced coughing fits while in [place] .  She has been tested for allergies in the past and was surprised to find she was not allergic to dust or cat hair, despite having three cats and living in a dusty old house. She has not had a flu shot but has received four COVID-19 vaccinations.  No difficulty breathing but reports persistent cough and nasal drainage.  CT chest 10/2023 showed scattered ground glass opacities  bilaterally. Plans for CT chest in 10/2024.    Allergies  Allergen Reactions   Nsaids Anaphylaxis and Other (See Comments)    Severe GI irritation.  Acute Gastritis  Severe GI irritation.   Ketoprofen     Stomach issues   Tramadol Itching   Macrodantin [Nitrofurantoin Macrocrystal] Rash   Oxycodone Itching   Venlafaxine Itching    Immunization History  Administered Date(s) Administered   PFIZER Comirnaty(Gray Top)Covid-19 Tri-Sucrose Vaccine 05/09/2021   PFIZER(Purple Top)SARS-COV-2 Vaccination 12/02/2019, 12/26/2019, 08/25/2020    Past Medical History:  Diagnosis Date   Breast cancer (HCC)    Diverticulosis    GERD (gastroesophageal reflux disease)    Headache    Heterozygous factor V Leiden mutation (HCC)    Hiatal hernia    Insomnia    Nervous system disorder    spinal cord tethered   Neurogenic bladder    Osteoporosis    Osteoporosis    Tubular adenoma of colon    Urinary incontinence     Tobacco History: Social History   Tobacco Use  Smoking Status Former   Current packs/day: 0.00   Average packs/day: 3.0 packs/day for 4.0 years (12.0 ttl pk-yrs)   Types: Cigarettes   Start date: 12/10/1968   Quit date: 12/10/1972   Years since quitting: 51.5  Smokeless Tobacco Never   Counseling given: Not Answered   Outpatient Medications Prior to Visit  Medication Sig Dispense Refill   albuterol (VENTOLIN HFA) 108 (90 Base) MCG/ACT inhaler Inhale into the lungs every 6 (six) hours as needed for wheezing or shortness of breath.  ALPRAZOLAM PO Take by mouth as needed.     Ascorbic Acid (VITAMIN C) 1000 MG tablet Take 1,000 mg by mouth daily.      aspirin 81 MG EC tablet Take 81 mg by mouth 3 (three) times a week.     atorvastatin  (LIPITOR) 10 MG tablet TAKE 1 TABLET BY MOUTH DAILY 30 tablet 0   Azelastine HCl 137 MCG/SPRAY SOLN Place 137 mcg into both nostrils in the morning and at bedtime.     b complex vitamins capsule Take 1 capsule by mouth daily.     calcium   citrate-vitamin D (CITRACAL+D) 315-200 MG-UNIT tablet Take 1 tablet by mouth every morning.     Cholecalciferol (VITAMIN D3) 2000 units TABS Take 2,000 Units by mouth daily.     fluticasone  (FLONASE  SENSIMIST) 27.5 MCG/SPRAY nasal spray Place 2 sprays into the nose daily.     fluticasone  (FLONASE ) 50 MCG/ACT nasal spray Place into both nostrils daily.     Lutein 6 MG CAPS Take 1 capsule by mouth with breakfast, with lunch, and with evening meal.     Lysine 1000 MG TABS Take 1 tablet by mouth daily.     Magnesium 500 MG CAPS Take 1 capsule by mouth daily.     methocarbamol (ROBAXIN) 500 MG tablet Take 500 mg by mouth every 6 (six) hours as needed for muscle spasms.     montelukast  (SINGULAIR ) 10 MG tablet Take 1 tablet (10 mg total) by mouth at bedtime. 90 tablet 3   Omega-3 Fatty Acids (OMEGA 3 500 PO) Take 1 tablet by mouth daily at 6 (six) AM.     omeprazole (PRILOSEC) 40 MG capsule Take 40 mg by mouth daily.     OVER THE COUNTER MEDICATION Take 3 tablets by mouth with breakfast, with lunch, and with evening meal. Bromelain Sinus Ease     tamoxifen  (NOLVADEX ) 10 MG tablet Take 1 tablet (10 mg total) by mouth daily. 90 tablet 3   Zinc Sulfate (ZINC-220 PO) Take 1 tablet by mouth daily at 6 (six) AM.     mirabegron ER (MYRBETRIQ) 50 MG TB24 tablet Take 1 tablet by mouth daily.     amoxicillin (AMOXIL) 875 MG tablet Take 875 mg by mouth 2 (two) times daily.     No facility-administered medications prior to visit.     Review of Systems:   Constitutional:   No  weight loss, night sweats,  Fevers, chills, fatigue, or  lassitude.  HEENT:   No headaches,  Difficulty swallowing,  Tooth/dental problems, or  Sore throat,                No sneezing, itching, ear ache, +nasal congestion, post nasal drip,   CV:  No chest pain,  Orthopnea, PND, swelling in lower extremities, anasarca, dizziness, palpitations, syncope.   GI  No heartburn, indigestion, abdominal pain, nausea, vomiting, diarrhea,  change in bowel habits, loss of appetite, bloody stools.   Resp:   No wheezing.  No chest wall deformity  Skin: no rash or lesions.  GU: no dysuria, change in color of urine, no urgency or frequency.  No flank pain, no hematuria   MS:  No joint pain or swelling.  No decreased range of motion.  No back pain.    Physical Exam  BP 110/76   Pulse 77   Temp 98.4 F (36.9 C) (Oral)   Ht 5' 3 (1.6 m)   Wt 122 lb 12.8 oz (55.7 kg)   LMP 08/01/2014  SpO2 98% Comment: RA  BMI 21.Regina kg/m   GEN: A/Ox3; pleasant , NAD, well nourished    HEENT:  [place]/AT,  NOSE-clear, THROAT-clear, no lesions, no postnasal drip or exudate noted.   NECK:  Supple w/ fair ROM; no JVD; normal carotid impulses w/o bruits; no thyromegaly or nodules palpated; no lymphadenopathy.    RESP  Clear  P & A; w/o, wheezes/ rales/ or rhonchi. no accessory muscle use, no dullness to percussion  CARD:  RRR, no m/r/g, no peripheral edema, pulses intact, no cyanosis or clubbing.  GI:   Soft & nt; nml bowel sounds; no organomegaly or masses detected.   Musco: Warm bil, no deformities or joint swelling noted.   Neuro: alert, no focal deficits noted.    Skin: Warm, no lesions or rashes    Lab Results:  CBC    Component Value Date/Time   WBC 6.5 06/02/2023 1222   WBC 6.9 05/01/2022 1208   RBC 4.50 06/02/2023 1222   HGB 13.7 06/02/2023 1222   HCT 42.4 06/02/2023 1222   PLT 178 06/02/2023 1222   MCV 94.2 06/02/2023 1222   MCH 30.4 06/02/2023 1222   MCHC 32.3 06/02/2023 1222   RDW 13.3 06/02/2023 1222   LYMPHSABS 1.9 06/02/2023 1222   MONOABS 0.5 06/02/2023 1222   EOSABS 0.2 06/02/2023 1222   BASOSABS 0.1 06/02/2023 1222    BMET    Component Value Date/Time   NA 140 06/02/2023 1222   NA 143 11/26/2022 0841   K 4.4 06/02/2023 1222   CL 105 06/02/2023 1222   CO2 32 06/02/2023 1222   GLUCOSE 140 (H) 06/02/2023 1222   BUN 17 06/02/2023 1222   BUN 17 11/26/2022 0841   CREATININE 0.74 06/02/2023 1222    CALCIUM  9.2 06/02/2023 1222   GFRNONAA >60 06/02/2023 1222   GFRAA >60 01/18/2020 0202    BNP No results found for: BNP  ProBNP No results found for: PROBNP  Imaging: No results found.  Administration History     None          Latest Ref Rng & Units 06/05/2014    9:59 AM  PFT Results  FVC-Pre L 2.32  P  FVC-Predicted Pre % 81  P  FVC-Post L 2.58  P  FVC-Predicted Post % 90  P  Pre FEV1/FVC % % 79  P  Post FEV1/FCV % % 81  P  FEV1-Pre L 1.82  P  FEV1-Predicted Pre % 84  P  FEV1-Post L 2.09  P  DLCO uncorrected ml/min/mmHg 17.68  P  DLCO UNC% % 77  P  DLVA Predicted % 96  P  TLC L 4.27  P  TLC % Predicted % 87  P  RV % Predicted % 84  P    P Preliminary result    No results found for: NITRICOXIDE      Assessment & Plan:   No problem-specific Assessment & Plan notes found for this encounter.    Assessment and Plan Assessment & Plan Chronic cough with postnasal drip and allergic rhinitis   Chronic cough with postnasal drip and allergic rhinitis persists, characterized by coughing fits and clear, sticky mucus plugs. Nasal drainage is the mucus source, likely worsened by environmental allergens. She prefers natural remedies but intermittently uses montelukast  and a prescription nasal spray. Use saline nasal spray twice daily and consider saline gel at night for moisture. Use Claritin over-the-counter for two weeks during high allergen season. Continue montelukast  and Flonase  and Astelin nasal  spray as needed.  Asthma mild intermittent.  Asthma/reactive airway. Albuterol inhaler was used during recent surgery due to coughing fits. Ensure albuterol inhaler is available as needed.Check PFT on return   Lung nodules- Repeat CT chest in January 2026   Follow-Up   Follow-up plans include scheduling a CT chest in January 2026 .   Plan  Patient Instructions  Saline nasal spray 2 puffs Twice daily   Saline nasal gel At bedtime   Flonase  nasal 2 puffs in  am As needed   Astelin nasal 2 puffs At bedtime  As needed   Claritin 10mg  daily As needed   Continue on Singulair  10mg  At bedtime   CT chest in 4 months  Albuterol inhaler As needed   PFT on return in 4 months  Follow up with Dr. Shelah  in 4 months after CT chest to review.      Madelin Stank, NP 06/06/2024  "

## 2024-06-06 NOTE — Patient Instructions (Addendum)
 Saline nasal spray 2 puffs Twice daily   Saline nasal gel At bedtime   Flonase  nasal 2 puffs in am As needed   Astelin nasal 2 puffs At bedtime  As needed   Claritin 10mg  daily As needed   Continue on Singulair  10mg  At bedtime   CT chest in 4 months  Albuterol inhaler As needed   PFT on return in 4 months  Follow up with Dr. Shelah  in 4 months after CT chest to review.

## 2024-06-08 ENCOUNTER — Inpatient Hospital Stay: Payer: PPO | Attending: Hematology and Oncology | Admitting: Hematology and Oncology

## 2024-06-08 VITALS — BP 112/68 | HR 80 | Temp 97.8°F | Resp 18 | Ht 63.0 in | Wt 121.8 lb

## 2024-06-08 DIAGNOSIS — Z1732 Human epidermal growth factor receptor 2 negative status: Secondary | ICD-10-CM | POA: Diagnosis not present

## 2024-06-08 DIAGNOSIS — C50412 Malignant neoplasm of upper-outer quadrant of left female breast: Secondary | ICD-10-CM | POA: Insufficient documentation

## 2024-06-08 DIAGNOSIS — Z79810 Long term (current) use of selective estrogen receptor modulators (SERMs): Secondary | ICD-10-CM | POA: Insufficient documentation

## 2024-06-08 DIAGNOSIS — Z1722 Progesterone receptor negative status: Secondary | ICD-10-CM | POA: Insufficient documentation

## 2024-06-08 DIAGNOSIS — Z79899 Other long term (current) drug therapy: Secondary | ICD-10-CM | POA: Insufficient documentation

## 2024-06-08 DIAGNOSIS — Z170 Estrogen receptor positive status [ER+]: Secondary | ICD-10-CM | POA: Insufficient documentation

## 2024-06-08 MED ORDER — TAMOXIFEN CITRATE 10 MG PO TABS: 10.0000 mg | ORAL_TABLET | Freq: Every day | ORAL | 3 refills | Status: AC

## 2024-06-08 NOTE — Progress Notes (Signed)
 Patient Care Team: Aisha Harvey, MD as PCP - General (Family Medicine) Odean Potts, MD as Consulting Physician (Hematology and Oncology) Izell Domino, MD as Attending Physician (Radiation Oncology) Curvin Deward MOULD, MD as Consulting Physician (General Surgery)  DIAGNOSIS:  Encounter Diagnosis  Name Primary?   Malignant neoplasm of upper-outer quadrant of left breast in female, estrogen receptor positive (HCC) Yes    SUMMARY OF ONCOLOGIC HISTORY: Oncology History  Malignant neoplasm of upper-outer quadrant of left breast in female, estrogen receptor positive (HCC)  05/19/2023 Initial Diagnosis   Screening mammogram detected left breast mass UOQ 2.2 cm (in 2021 patient had CSL: Went on surveillance), by ultrasound measured 1.6 cm at 1 o'clock position (entire area with CSL 2.6 cm), axilla negative, biopsy grade 2 IDC ER 80%, PR 0%, Ki67 30%, HER2 negative by FISH   06/02/2023 Cancer Staging   Staging form: Breast, AJCC 8th Edition - Clinical: Stage IB (cT2, cN0, cM0, G2, ER+, PR+, HER2-) - Signed by Odean Potts, MD on 06/02/2023 Stage prefix: Initial diagnosis Histologic grading system: 3 grade system   06/12/2023 Genetic Testing   Negative [facility] CustomNext-Cancer +RNA Panel.  Report date is 06/12/2023.   The CustomNext-Cancer+RNAinsight panel offered by [facility] includes sequencing and rearrangement analysis for the following 38 genes:  APC, ATM, BAP1, BARD1, BMPR1A, BRCA1, BRCA2, BRIP1, CDH1, CDK4, CDKN2A, CHEK2, DICER1, MLH1, MSH2, MSH6, MUTYH, NF1, NTHL1, PALB2, PMS2, POT1, PTEN, RAD51C, RAD51D, RB1, SMAD4, SMARCA4, STK11 and TP53 (sequencing and deletion/duplication); AXIN2, HOXB13, MITF, MSH3, POLD1 and POLE (sequencing only); EPCAM and GREM1 (deletion/duplication only). RNA data is routinely analyzed for use in variant interpretation for all genes.   06/17/2023 Surgery   Left lumpectomy: Grade 3 IDC 1.8 cm with DCIS high-grade negative, lymphovascular invasion not  identified, ER 80%, PR 0%, HER2 negative by FISH, Ki-67 30%   06/17/2023 Cancer Staging   Staging form: Breast, AJCC 8th Edition - Pathologic stage from 06/17/2023: Stage IA (pT1c, pN0, cM0, G3, ER+, PR-, HER2-) - Signed by Crawford Morna Pickle, NP on 12/09/2023 Stage prefix: Initial diagnosis Histologic grading system: 3 grade system   08/02/2023 - 08/30/2023 Radiation Therapy   Plan Name: Breast_L_BH Site: Breast, Left Technique: 3D Mode: Photon Dose Per Fraction: 2.67 Gy Prescribed Dose (Delivered / Prescribed): 40.05 Gy / 40.05 Gy Prescribed Fxs (Delivered / Prescribed): 15 / 15   Plan Name: Brst_L_Bst_BH Site: Breast, Left Technique: 3D Mode: Photon Dose Per Fraction: 2 Gy Prescribed Dose (Delivered / Prescribed): 10 Gy / 10 Gy Prescribed Fxs (Delivered / Prescribed): 5 / 5   09/10/2023 -  Anti-estrogen oral therapy   10 mg Tamoxifen  x 5 years     CHIEF COMPLIANT: Surveillance of breast cancer  HISTORY OF PRESENT ILLNESS:   History of Present Illness Regina [name] is an 83 year old female with a history of cancer who presents for follow-up after recent surgery for a compression fracture. She is accompanied by her spouse.  She is currently on tamoxifen  for her cancer, which she has been taking for almost a year without significant side effects such as hot flashes or mood swings. She has not been on other bone-building medications due to ongoing dental procedures, including implants and a recent tooth fracture.  A recent mammogram at [facility] showed no concerns. 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  ALLERGIES:  is allergic to nsaids, ketoprofen, tramadol, macrodantin [nitrofurantoin macrocrystal], oxycodone, and venlafaxine.  MEDICATIONS:  Current Outpatient Medications  Medication Sig Dispense Refill   albuterol (VENTOLIN HFA)  108 (90 Base) MCG/ACT inhaler Inhale into the lungs every 6 (six) hours as needed for wheezing or shortness of breath.     ALPRAZOLAM PO Take by mouth as needed.     Ascorbic Acid (VITAMIN C) 1000 MG tablet Take 1,000 mg by mouth daily.      aspirin 81 MG EC tablet Take 81 mg by mouth 3 (three) times a week.     atorvastatin  (LIPITOR) 10 MG tablet TAKE 1 TABLET BY MOUTH DAILY 30 tablet 0   Azelastine HCl 137 MCG/SPRAY SOLN Place 137 mcg into both nostrils in the morning and at bedtime.     b complex vitamins capsule Take 1 capsule by mouth daily.     calcium  citrate-vitamin D (CITRACAL+D) 315-200 MG-UNIT tablet Take 1 tablet by mouth every morning.     Cholecalciferol (VITAMIN D3) 2000 units TABS Take 2,000 Units by mouth daily.     fluticasone  (FLONASE  SENSIMIST) 27.5 MCG/SPRAY nasal spray Place 2 sprays into the nose daily.     fluticasone  (FLONASE ) 50 MCG/ACT nasal spray Place into both nostrils daily.     Lutein 6 MG CAPS Take 1 capsule by mouth with breakfast, with lunch, and with evening meal.     Lysine 1000 MG TABS Take 1 tablet by mouth daily.     Magnesium 500 MG CAPS Take 1 capsule by mouth daily.     methocarbamol (ROBAXIN) 500 MG tablet Take 500 mg by mouth every 6 (six) hours as needed for muscle spasms.     mirabegron ER (MYRBETRIQ) 50 MG TB24 tablet Take 1 tablet by mouth daily.     montelukast  (SINGULAIR ) 10 MG tablet Take 1 tablet (10 mg total) by mouth at bedtime. 90 tablet 3   Omega-3 Fatty Acids (OMEGA 3 500 PO) Take 1 tablet by mouth daily at 6 (six) AM.     omeprazole (PRILOSEC) 40 MG capsule Take 40 mg by mouth daily.     OVER THE COUNTER MEDICATION Take 3 tablets by mouth with breakfast, with lunch, and with evening meal. Bromelain Sinus Ease     tamoxifen  (NOLVADEX ) 10 MG tablet Take 1 tablet (10 mg total) by mouth daily. 90 tablet 3   Zinc Sulfate (ZINC-220 PO) Take 1 tablet by mouth daily at 6 (six) AM.     No current facility-administered medications for this  visit.    PHYSICAL EXAMINATION: ECOG PERFORMANCE STATUS: 1 - Symptomatic but completely ambulatory  Vitals:   06/08/24 1108  BP: 112/68  Pulse: 80  Resp: 18  Temp: 97.8 F (36.6 C)  SpO2: 98%   Filed Weights   06/08/24 1108  Weight: 121 lb 12.8 oz (55.2 kg)      LABORATORY DATA:  I have reviewed the data as listed    Latest Ref Rng & Units 06/02/2023   12:22 PM 11/26/2022    8:41 AM 05/01/2022   12:08 PM  CMP  Glucose 70 - 99 mg/dL 859  92  81   BUN 8 - 23 mg/dL 17  17  15    Creatinine 0.44 - 1.00 mg/dL 9.25  9.32  9.32   Sodium 135 - 145 mmol/L 140  143  141   Potassium 3.5 - 5.1 mmol/L 4.4  3.9  3.9   Chloride 98 - 111 mmol/L 105  104  108   CO2 22 - 32 mmol/L 32  26  23   Calcium  8.9 - 10.3 mg/dL 9.2  8.9  8.8   Total Protein 6.5 - 8.1 g/dL 6.6     Total Bilirubin 0.3 - 1.2 mg/dL 0.6     Alkaline Phos 38 - 126 U/L 66     AST 15 - 41 U/L 24     ALT 0 - 44 U/L 24       Lab Results  Component Value Date   WBC 6.5 06/02/2023   HGB 13.7 06/02/2023   HCT 42.4 06/02/2023   MCV 94.2 06/02/2023   PLT 178 06/02/2023   NEUTROABS 3.9 06/02/2023    ASSESSMENT & PLAN:  Malignant neoplasm of upper-outer quadrant of left breast in female, estrogen receptor positive (HCC) 05/19/2023:Screening mammogram detected left breast mass UOQ 2.2 cm (in 2021 patient had CSL: Went on surveillance), by ultrasound measured 1.6 cm at 1 o'clock position (entire area with CSL 2.6 cm), axilla negative, biopsy grade 2 IDC ER 80%, PR 0%, Ki67 30%, HER2 negative by FISH    06/17/2023: Left lumpectomy: Grade 3 IDC 1.8 cm with DCIS high-grade negative, lymphovascular invasion not identified, ER 80%, PR 0%, HER2 negative by FISH, Ki-67 30%     Treatment plan:  Adjuvant radiation therapy 08/06/2023-08/30/2023 2.  Adjuvant antiestrogen therapy to start 09/12/2023 tamoxifen  was chosen because of severe osteoporosis.  We kept the dose of 10 mg because of prior history ? Blood clot in the eye)     Tamoxifen  toxicities: Tolerating it extremely well without any problems or concerns  Breast cancer surveillance: Breast exam 05/31/2024: Benign Breast pain: 12/30/2023 ultrasound left breast: Benign Mammogram 11/09/2023: [facility]: Benign breast density category C  Return to clinic in 1 year for follow-up    Assessment & Plan Estrogen receptor positive malignant neoplasm of upper-outer quadrant of left breast Estrogen receptor positive breast cancer in the upper-outer quadrant of the left breast. She has been on tamoxifen  for one year without significant side effects. Tamoxifen  is expected to maintain bone density and manage cancer risk. Continuing tamoxifen  for five years is crucial for optimal outcomes. - Continue tamoxifen  therapy for a total of five years. - Ensure tamoxifen  refills are available at [facility]. - Review mammogram results from [facility]. - Schedule annual follow-up visits.  Compression fracture of forearm Compression fracture of the forearm occurred two months ago, with surgery performed one month ago. She is currently undergoing therapy to regain hand function. There are no significant limitations in arm movement post-surgery, but she is working on improving range of motion. - Continue therapy to improve hand function and range of motion.      No orders of the defined types were placed in this encounter.  The patient has a good understanding of the overall plan. she agrees with it. she will call with any problems that may develop before the next visit here. Total time spent: 30 mins including face to face time and time spent for planning, charting and co-ordination of care   Naomi MARLA Chad, MD 06/08/24

## 2024-06-08 NOTE — Assessment & Plan Note (Signed)
 05/19/2023:Screening mammogram detected left breast mass UOQ 2.2 cm (in 2021 patient had CSL: Went on surveillance), by ultrasound measured 1.6 cm at 1 o'clock position (entire area with CSL 2.6 cm), axilla negative, biopsy grade 2 IDC ER 80%, PR 0%, Ki67 30%, HER2 negative by FISH    06/17/2023: Left lumpectomy: Grade 3 IDC 1.8 cm with DCIS high-grade negative, lymphovascular invasion not identified, ER 80%, PR 0%, HER2 negative by FISH, Ki-67 30%     Treatment plan:  Adjuvant radiation therapy 08/06/2023-08/30/2023 2.  Adjuvant antiestrogen therapy to start 09/12/2023 tamoxifen  was chosen because of severe osteoporosis.  We kept the dose of 10 mg because of prior history ? Blood clot in the eye)    Tamoxifen  toxicities:  Breast cancer surveillance: Breast exam 05/31/2024: Benign Breast pain: 12/30/2023 ultrasound left breast: Benign Mammogram 11/09/2023: [facility]: Benign breast density category C  Return to clinic in 1 year for follow-up

## 2024-06-13 DIAGNOSIS — M25531 Pain in right wrist: Secondary | ICD-10-CM | POA: Diagnosis not present

## 2024-06-15 DIAGNOSIS — M25531 Pain in right wrist: Secondary | ICD-10-CM | POA: Diagnosis not present

## 2024-06-20 DIAGNOSIS — M25531 Pain in right wrist: Secondary | ICD-10-CM | POA: Diagnosis not present

## 2024-06-22 DIAGNOSIS — M25531 Pain in right wrist: Secondary | ICD-10-CM | POA: Diagnosis not present

## 2024-06-23 ENCOUNTER — Other Ambulatory Visit: Payer: Self-pay | Admitting: Cardiology

## 2024-06-27 ENCOUNTER — Other Ambulatory Visit: Payer: Self-pay | Admitting: Cardiology

## 2024-06-27 DIAGNOSIS — S52571D Other intraarticular fracture of lower end of right radius, subsequent encounter for closed fracture with routine healing: Secondary | ICD-10-CM | POA: Diagnosis not present

## 2024-06-27 DIAGNOSIS — M25531 Pain in right wrist: Secondary | ICD-10-CM | POA: Diagnosis not present

## 2024-06-28 DIAGNOSIS — L720 Epidermal cyst: Secondary | ICD-10-CM | POA: Diagnosis not present

## 2024-06-28 DIAGNOSIS — L821 Other seborrheic keratosis: Secondary | ICD-10-CM | POA: Diagnosis not present

## 2024-06-28 DIAGNOSIS — B353 Tinea pedis: Secondary | ICD-10-CM | POA: Diagnosis not present

## 2024-06-28 DIAGNOSIS — L570 Actinic keratosis: Secondary | ICD-10-CM | POA: Diagnosis not present

## 2024-06-28 DIAGNOSIS — Z85828 Personal history of other malignant neoplasm of skin: Secondary | ICD-10-CM | POA: Diagnosis not present

## 2024-06-28 DIAGNOSIS — D485 Neoplasm of uncertain behavior of skin: Secondary | ICD-10-CM | POA: Diagnosis not present

## 2024-06-28 DIAGNOSIS — L718 Other rosacea: Secondary | ICD-10-CM | POA: Diagnosis not present

## 2024-06-29 DIAGNOSIS — M25531 Pain in right wrist: Secondary | ICD-10-CM | POA: Diagnosis not present

## 2024-06-30 DIAGNOSIS — M5451 Vertebrogenic low back pain: Secondary | ICD-10-CM | POA: Diagnosis not present

## 2024-06-30 DIAGNOSIS — M51362 Other intervertebral disc degeneration, lumbar region with discogenic back pain and lower extremity pain: Secondary | ICD-10-CM | POA: Diagnosis not present

## 2024-06-30 DIAGNOSIS — M545 Low back pain, unspecified: Secondary | ICD-10-CM | POA: Diagnosis not present

## 2024-07-04 DIAGNOSIS — M25531 Pain in right wrist: Secondary | ICD-10-CM | POA: Diagnosis not present

## 2024-07-04 DIAGNOSIS — G250 Essential tremor: Secondary | ICD-10-CM | POA: Diagnosis not present

## 2024-07-04 DIAGNOSIS — D6851 Activated protein C resistance: Secondary | ICD-10-CM | POA: Diagnosis not present

## 2024-07-04 DIAGNOSIS — Z79899 Other long term (current) drug therapy: Secondary | ICD-10-CM | POA: Diagnosis not present

## 2024-07-04 DIAGNOSIS — R7301 Impaired fasting glucose: Secondary | ICD-10-CM | POA: Diagnosis not present

## 2024-07-04 DIAGNOSIS — M810 Age-related osteoporosis without current pathological fracture: Secondary | ICD-10-CM | POA: Diagnosis not present

## 2024-07-04 DIAGNOSIS — Z13220 Encounter for screening for lipoid disorders: Secondary | ICD-10-CM | POA: Diagnosis not present

## 2024-07-04 DIAGNOSIS — M51362 Other intervertebral disc degeneration, lumbar region with discogenic back pain and lower extremity pain: Secondary | ICD-10-CM | POA: Diagnosis not present

## 2024-07-04 DIAGNOSIS — R000 Tachycardia, unspecified: Secondary | ICD-10-CM | POA: Diagnosis not present

## 2024-07-05 DIAGNOSIS — K219 Gastro-esophageal reflux disease without esophagitis: Secondary | ICD-10-CM | POA: Diagnosis not present

## 2024-07-05 DIAGNOSIS — R052 Subacute cough: Secondary | ICD-10-CM | POA: Diagnosis not present

## 2024-07-05 DIAGNOSIS — J309 Allergic rhinitis, unspecified: Secondary | ICD-10-CM | POA: Diagnosis not present

## 2024-07-07 DIAGNOSIS — M51362 Other intervertebral disc degeneration, lumbar region with discogenic back pain and lower extremity pain: Secondary | ICD-10-CM | POA: Diagnosis not present

## 2024-07-09 ENCOUNTER — Other Ambulatory Visit: Payer: Self-pay | Admitting: Cardiology

## 2024-07-11 DIAGNOSIS — D6851 Activated protein C resistance: Secondary | ICD-10-CM | POA: Diagnosis not present

## 2024-07-11 DIAGNOSIS — Z0000 Encounter for general adult medical examination without abnormal findings: Secondary | ICD-10-CM | POA: Diagnosis not present

## 2024-07-11 DIAGNOSIS — N319 Neuromuscular dysfunction of bladder, unspecified: Secondary | ICD-10-CM | POA: Diagnosis not present

## 2024-07-11 DIAGNOSIS — C50912 Malignant neoplasm of unspecified site of left female breast: Secondary | ICD-10-CM | POA: Diagnosis not present

## 2024-07-11 DIAGNOSIS — I2510 Atherosclerotic heart disease of native coronary artery without angina pectoris: Secondary | ICD-10-CM | POA: Diagnosis not present

## 2024-07-11 DIAGNOSIS — J328 Other chronic sinusitis: Secondary | ICD-10-CM | POA: Diagnosis not present

## 2024-07-11 DIAGNOSIS — R7303 Prediabetes: Secondary | ICD-10-CM | POA: Diagnosis not present

## 2024-07-11 DIAGNOSIS — K219 Gastro-esophageal reflux disease without esophagitis: Secondary | ICD-10-CM | POA: Diagnosis not present

## 2024-07-11 DIAGNOSIS — M810 Age-related osteoporosis without current pathological fracture: Secondary | ICD-10-CM | POA: Diagnosis not present

## 2024-07-11 DIAGNOSIS — Z23 Encounter for immunization: Secondary | ICD-10-CM | POA: Diagnosis not present

## 2024-07-11 DIAGNOSIS — R002 Palpitations: Secondary | ICD-10-CM | POA: Diagnosis not present

## 2024-07-11 DIAGNOSIS — R053 Chronic cough: Secondary | ICD-10-CM | POA: Diagnosis not present

## 2024-07-13 DIAGNOSIS — M25531 Pain in right wrist: Secondary | ICD-10-CM | POA: Diagnosis not present

## 2024-07-17 DIAGNOSIS — M51362 Other intervertebral disc degeneration, lumbar region with discogenic back pain and lower extremity pain: Secondary | ICD-10-CM | POA: Diagnosis not present

## 2024-07-18 DIAGNOSIS — M25641 Stiffness of right hand, not elsewhere classified: Secondary | ICD-10-CM | POA: Diagnosis not present

## 2024-07-18 DIAGNOSIS — M25531 Pain in right wrist: Secondary | ICD-10-CM | POA: Diagnosis not present

## 2024-07-20 DIAGNOSIS — M25531 Pain in right wrist: Secondary | ICD-10-CM | POA: Diagnosis not present

## 2024-07-20 DIAGNOSIS — M25641 Stiffness of right hand, not elsewhere classified: Secondary | ICD-10-CM | POA: Diagnosis not present

## 2024-07-20 DIAGNOSIS — M51362 Other intervertebral disc degeneration, lumbar region with discogenic back pain and lower extremity pain: Secondary | ICD-10-CM | POA: Diagnosis not present

## 2024-07-20 NOTE — Progress Notes (Unsigned)
   LILLETTE Ileana Collet, PhD, LAT, ATC acting as a scribe for Artist Lloyd, MD.  Regina [name] is a 83 y.o. female who presents to [facility] today for L hip pain x ***. Pt locates pain to ***  Radiates: Aggravates: Treatments tried:  Dx testing: 04/30/22 DEXA  03/25/21 L hip XR  Pertinent review of systems: ***  Relevant historical information: ***   Exam:  LMP 08/01/2014  General: Well Developed, well nourished, and in no acute distress.   MSK: ***    Lab and Radiology Results No results found for this or any previous visit (from the past 72 hours). No results found.     Assessment and Plan: 83 y.o. female with ***   PDMP not reviewed this encounter. No orders of the defined types were placed in this encounter.  No orders of the defined types were placed in this encounter.    Discussed warning signs or symptoms. Please see discharge instructions. Patient expresses understanding.   ***

## 2024-07-21 ENCOUNTER — Encounter: Payer: Self-pay | Admitting: Family Medicine

## 2024-07-21 ENCOUNTER — Ambulatory Visit: Admitting: Family Medicine

## 2024-07-21 ENCOUNTER — Encounter: Payer: Self-pay | Admitting: Emergency Medicine

## 2024-07-21 ENCOUNTER — Ambulatory Visit (INDEPENDENT_AMBULATORY_CARE_PROVIDER_SITE_OTHER)

## 2024-07-21 ENCOUNTER — Other Ambulatory Visit: Payer: Self-pay

## 2024-07-21 ENCOUNTER — Ambulatory Visit: Payer: Self-pay | Admitting: Family Medicine

## 2024-07-21 ENCOUNTER — Ambulatory Visit: Attending: Emergency Medicine | Admitting: Emergency Medicine

## 2024-07-21 VITALS — BP 110/50 | HR 77 | Ht 63.0 in | Wt 118.0 lb

## 2024-07-21 VITALS — BP 110/72 | HR 71 | Ht 63.0 in | Wt 117.0 lb

## 2024-07-21 DIAGNOSIS — M5135 Other intervertebral disc degeneration, thoracolumbar region: Secondary | ICD-10-CM | POA: Diagnosis not present

## 2024-07-21 DIAGNOSIS — M533 Sacrococcygeal disorders, not elsewhere classified: Secondary | ICD-10-CM | POA: Diagnosis not present

## 2024-07-21 DIAGNOSIS — M47816 Spondylosis without myelopathy or radiculopathy, lumbar region: Secondary | ICD-10-CM | POA: Diagnosis not present

## 2024-07-21 DIAGNOSIS — E785 Hyperlipidemia, unspecified: Secondary | ICD-10-CM

## 2024-07-21 DIAGNOSIS — M545 Low back pain, unspecified: Secondary | ICD-10-CM

## 2024-07-21 DIAGNOSIS — D6851 Activated protein C resistance: Secondary | ICD-10-CM | POA: Diagnosis not present

## 2024-07-21 DIAGNOSIS — I471 Supraventricular tachycardia, unspecified: Secondary | ICD-10-CM

## 2024-07-21 DIAGNOSIS — M25552 Pain in left hip: Secondary | ICD-10-CM

## 2024-07-21 DIAGNOSIS — I2510 Atherosclerotic heart disease of native coronary artery without angina pectoris: Secondary | ICD-10-CM | POA: Diagnosis not present

## 2024-07-21 DIAGNOSIS — G8929 Other chronic pain: Secondary | ICD-10-CM

## 2024-07-21 DIAGNOSIS — R002 Palpitations: Secondary | ICD-10-CM

## 2024-07-21 DIAGNOSIS — M5136 Other intervertebral disc degeneration, lumbar region with discogenic back pain only: Secondary | ICD-10-CM | POA: Diagnosis not present

## 2024-07-21 MED ORDER — ATORVASTATIN CALCIUM 10 MG PO TABS: 10.0000 mg | ORAL_TABLET | Freq: Every day | ORAL | 3 refills | Status: AC

## 2024-07-21 NOTE — Progress Notes (Signed)
 Low back x-ray shows severe arthritis at the upper portion of the lumbar spine.  No broken bones or fractures are visible.

## 2024-07-21 NOTE — Patient Instructions (Addendum)
 Thank you for coming in today.   Please get an Xray today before you leave   We've placed an order for a MRI of your lower back. The imaging facility will reach out to assist with scheduling after confirming insurance benefits.   See you back to review results.

## 2024-07-21 NOTE — Progress Notes (Addendum)
 Cardiology Office Note:    Date:  07/21/2024  ID:  Regina [name], Regina [name] [birth year], MRN [id number] PCP: Aisha Harvey, MD  [facility] Providers Cardiologist:  Lonni LITTIE Nanas, MD       Patient Profile:       Chief Complaint: 1 year follow-up History of Present Illness:  Regina [name] is a 83 y.o. female with visit-pertinent history of factor V Leiden  She established with cardiology service on 05/04/2024 for palpitations and chest pain.  She was seen in the ED on 05/01/2022 with palpitations.  Labs notable for troponin 22, 19.  She reported a single episode of palpitations that occurred the night she went to the ED.  Reported heart felt like it was racing and lasted for 30 minutes.  Echocardiogram 05/2022 showed normal biventricular function no significant valvular disease.  Coronary CTA on 06/2022 showed minimal CAD, calcium  score 6 (22nd percentile).  Zio patch 14 days on 06/01/2022 showed 18 episodes of SVT, longest lasting 18 beats.  Last seen in clinic on 06/21/2023.  Her palpitations had resolved and she reported no further episodes of chest pain.  No medication changes were made.  She was to follow-up in 1 year.   Discussed the use of AI scribe software for clinical note transcription with the patient, who gave verbal consent to proceed.  History of Present Illness Regina [name] is an 83 year old female with supraventricular tachycardia and coronary artery plaque who presents for a medication refill and follow-up.  Today she is doing well without acute cardiovascular concerns.  She continues to remain very active  She has not experienced any recent episodes of chest pain or tachycardia since her initial presentation in 2023, when she had a prolonged period of rapid heartbeats.  She continues to take atorvastatin  without significant muscle aches. Her family history includes her mother having transient ischemic attacks and brain atrophy at age 85. She remains  active, participating in [employer] twice a week.  She denies any exertional symptoms.  No recent chest pain, dyspnea, orthopnea, PND, palpitations, tachycardia, leg swelling, or significant muscle aches.   Review of systems:  Please see the history of present illness. All other systems are reviewed and otherwise negative.      Studies Reviewed:    EKG Interpretation Date/Time:  Friday July 21 2024 10:50:17 EDT Ventricular Rate:  77 PR Interval:  134 QRS Duration:  76 QT Interval:  380 QTC Calculation: 430 R Axis:   54  Text Interpretation: Normal sinus rhythm Low voltage QRS When compared with ECG of 21-Jun-2023 16:30, No significant change was found Confirmed by Rana Dixon [phone]) on 07/21/2024 12:10:15 PM    Coronary CTA 06/18/2022 1. Coronary artery calcium  score 6.1 Agatston units. This places the patient in the 22nd percentile for age and gender, suggesting low risk for future cardiac events.   2.  Minimal coronary disease.  Zio 05/04/2022   18 episodes of SVT, longest lasting 18 beats     Patch Wear Time:  13 days and 18 hours (2023-07-26T14:15:06-399 to 2023-08-09T08:19:40-0400)   Patient had a min HR of 58 bpm, max HR of 176 bpm, and avg HR of 79 bpm. Predominant underlying rhythm was Sinus Rhythm. 18 Supraventricular Tachycardia runs occurred, the run with the fastest interval lasting 6 beats with a max rate of 176 bpm, the  longest lasting 18 beats with an avg rate of 144 bpm. Isolated SVEs were rare (<1.0%), SVE Couplets were rare (<1.0%), and  SVE Triplets were rare (<1.0%). Isolated VEs were rare (<1.0%), and no VE Couplets or VE Triplets were present.  No patient triggered event  Echocardiogram 05/14/2022 1. Left ventricular ejection fraction, by estimation, is 55 to 60%. The  left ventricle has normal function. The left ventricle has no regional  wall motion abnormalities. Left ventricular diastolic parameters were  normal.   2. Right ventricular systolic  function is normal. The right ventricular  size is normal.   3. The mitral valve is normal in structure. Trivial mitral valve  regurgitation. No evidence of mitral stenosis.   4. The aortic valve is tricuspid. Aortic valve regurgitation is not  visualized. No aortic stenosis is present.   5. The inferior vena cava is normal in size with greater than 50%  respiratory variability, suggesting right atrial pressure of 3 mmHg.  Risk Assessment/Calculations:              Physical Exam:   VS:  BP (!) 110/50   Pulse 77   Ht 5' 3 (1.6 m)   Wt 118 lb (53.5 kg)   LMP 08/01/2014   SpO2 96%   BMI 20.90 kg/m    Wt Readings from Last 3 Encounters:  07/21/24 118 lb (53.5 kg)  07/21/24 117 lb (53.1 kg)  06/08/24 121 lb 12.8 oz (55.2 kg)    GEN: Well nourished, well developed in no acute distress NECK: No JVD; No carotid bruits CARDIAC: RRR, no murmurs, rubs, gallops RESPIRATORY:  Clear to auscultation without rales, wheezing or rhonchi  ABDOMEN: Soft, non-tender, non-distended EXTREMITIES:  No edema; No acute deformity      Assessment and Plan:  Coronary artery calcification Coronary CTA 06/2022 showed coronary calcium  score of 6.1 (22nd percentile) with minimal coronary disease - EKG today without acute ischemic changes - Today patient is stable without chest pains.  She remains very physically active without exertional symptoms.  No symptoms to suggest active angina, no indication for further ischemic evaluation at this time - Continue atorvastatin  10 mg daily  Palpitations PSVT Zio 05/2022 showing 18 SVT runs occurred, the run with the fastest interval lasting 6 beats with max rate of 176 bpm and longest lasting 18 beats with average rate of 144 bpm - Quiescent and well-controlled  Hyperlipidemia LDL 45 on 06/2024 and well-controlled - Continue atorvastatin  10 mg daily  Factor V Leiden - She reports she is established with a hematologist/oncologist since her breast cancer  treated  History of breast cancer  S/p left lumpectomy on 06/2023.  Underwent radiation therapy which was completed on 08/30/2023.  Managed on tamoxifen  - Management per oncology      Dispo:  Return in about 1 year (around 07/21/2025).  Signed, Lum LITTIE Louis, NP

## 2024-07-21 NOTE — Patient Instructions (Signed)
 Medication Instructions:  Your physician recommends that you continue on your current medications as directed. Please refer to the Current Medication list given to you today.  *If you need a refill on your cardiac medications before your next appointment, please call your pharmacy*  Lab Work: None ordered  If you have labs (blood work) drawn today and your tests are completely normal, you will receive your results only by: MyChart Message (if you have MyChart) OR A paper copy in the mail If you have any lab test that is abnormal or we need to change your treatment, we will call you to review the results.  Testing/Procedures: None ordered  Follow-Up: At [facility], you and your health needs are our priority.  As part of our continuing mission to provide you with exceptional heart care, our providers are all part of one team.  This team includes your primary Cardiologist (physician) and Advanced Practice Providers or APPs (Physician Assistants and Nurse Practitioners) who all work together to provide you with the care you need, when you need it.  Your next appointment:   12 month(s)  Provider:   Lonni LITTIE Nanas, MD or Lum Louis, NP          We recommend signing up for the patient portal called MyChart.  Sign up information is provided on this After Visit Summary.  MyChart is used to connect with patients for Virtual Visits (Telemedicine).  Patients are able to view lab/test results, encounter notes, upcoming appointments, etc.  Non-urgent messages can be sent to your provider as well.   To learn more about what you can do with MyChart, go to [email or web address].   Other Instructions

## 2024-07-25 DIAGNOSIS — M25641 Stiffness of right hand, not elsewhere classified: Secondary | ICD-10-CM | POA: Diagnosis not present

## 2024-07-25 DIAGNOSIS — M25531 Pain in right wrist: Secondary | ICD-10-CM | POA: Diagnosis not present

## 2024-07-25 DIAGNOSIS — M51362 Other intervertebral disc degeneration, lumbar region with discogenic back pain and lower extremity pain: Secondary | ICD-10-CM | POA: Diagnosis not present

## 2024-07-27 DIAGNOSIS — M25641 Stiffness of right hand, not elsewhere classified: Secondary | ICD-10-CM | POA: Diagnosis not present

## 2024-07-27 DIAGNOSIS — S52571D Other intraarticular fracture of lower end of right radius, subsequent encounter for closed fracture with routine healing: Secondary | ICD-10-CM | POA: Diagnosis not present

## 2024-07-27 DIAGNOSIS — M51362 Other intervertebral disc degeneration, lumbar region with discogenic back pain and lower extremity pain: Secondary | ICD-10-CM | POA: Diagnosis not present

## 2024-07-27 DIAGNOSIS — M25531 Pain in right wrist: Secondary | ICD-10-CM | POA: Diagnosis not present

## 2024-07-27 NOTE — Progress Notes (Signed)
 X-ray of the sacrum and coccyx shows no fractures.

## 2024-08-01 DIAGNOSIS — M51362 Other intervertebral disc degeneration, lumbar region with discogenic back pain and lower extremity pain: Secondary | ICD-10-CM | POA: Diagnosis not present

## 2024-08-01 DIAGNOSIS — M25531 Pain in right wrist: Secondary | ICD-10-CM | POA: Diagnosis not present

## 2024-08-01 DIAGNOSIS — M25641 Stiffness of right hand, not elsewhere classified: Secondary | ICD-10-CM | POA: Diagnosis not present

## 2024-08-03 DIAGNOSIS — M51362 Other intervertebral disc degeneration, lumbar region with discogenic back pain and lower extremity pain: Secondary | ICD-10-CM | POA: Diagnosis not present

## 2024-08-04 ENCOUNTER — Ambulatory Visit
Admission: RE | Admit: 2024-08-04 | Discharge: 2024-08-04 | Disposition: A | Discharge status: Home / Self Care | Source: Ambulatory Visit | Attending: Family Medicine | Admitting: Family Medicine

## 2024-08-04 DIAGNOSIS — G8929 Other chronic pain: Secondary | ICD-10-CM

## 2024-08-04 DIAGNOSIS — M25552 Pain in left hip: Secondary | ICD-10-CM

## 2024-08-04 DIAGNOSIS — M25551 Pain in right hip: Secondary | ICD-10-CM | POA: Diagnosis not present

## 2024-08-04 DIAGNOSIS — M5126 Other intervertebral disc displacement, lumbar region: Secondary | ICD-10-CM | POA: Diagnosis not present

## 2024-08-04 DIAGNOSIS — M47816 Spondylosis without myelopathy or radiculopathy, lumbar region: Secondary | ICD-10-CM | POA: Diagnosis not present

## 2024-08-04 MED ORDER — GADOPICLENOL 0.5 MMOL/ML IV SOLN
5.0000 mL | Freq: Once | INTRAVENOUS | Status: AC | PRN
  Administered 2024-08-04: 5 mL via INTRAVENOUS

## 2024-08-08 DIAGNOSIS — M25531 Pain in right wrist: Secondary | ICD-10-CM | POA: Diagnosis not present

## 2024-08-08 DIAGNOSIS — M25641 Stiffness of right hand, not elsewhere classified: Secondary | ICD-10-CM | POA: Diagnosis not present

## 2024-08-11 NOTE — Progress Notes (Signed)
 X-ray images lumbar spine do show some arthritis changes in the back that could cause some of the back pain.  Recommend return to clinic to go over the results of full detail and discuss treatment plan and options.

## 2024-08-11 NOTE — Progress Notes (Signed)
 MRI of the sacrum does show a stable lipoma.  You do have a little bit of arthritis in the left hip.  Recommend return to clinic to go over the results in full detail.

## 2024-08-14 DIAGNOSIS — M51362 Other intervertebral disc degeneration, lumbar region with discogenic back pain and lower extremity pain: Secondary | ICD-10-CM | POA: Diagnosis not present

## 2024-08-18 DIAGNOSIS — M51362 Other intervertebral disc degeneration, lumbar region with discogenic back pain and lower extremity pain: Secondary | ICD-10-CM | POA: Diagnosis not present

## 2024-08-22 DIAGNOSIS — M51362 Other intervertebral disc degeneration, lumbar region with discogenic back pain and lower extremity pain: Secondary | ICD-10-CM | POA: Diagnosis not present

## 2024-08-24 DIAGNOSIS — M25531 Pain in right wrist: Secondary | ICD-10-CM | POA: Diagnosis not present

## 2024-08-24 DIAGNOSIS — M25641 Stiffness of right hand, not elsewhere classified: Secondary | ICD-10-CM | POA: Diagnosis not present

## 2024-08-25 DIAGNOSIS — M51362 Other intervertebral disc degeneration, lumbar region with discogenic back pain and lower extremity pain: Secondary | ICD-10-CM | POA: Diagnosis not present

## 2024-08-26 DIAGNOSIS — M810 Age-related osteoporosis without current pathological fracture: Secondary | ICD-10-CM | POA: Diagnosis not present

## 2024-08-26 LAB — HM DEXA SCAN

## 2024-08-28 ENCOUNTER — Ambulatory Visit: Admitting: Family Medicine

## 2024-08-28 VITALS — BP 100/60 | HR 91 | Ht 63.0 in | Wt 119.0 lb

## 2024-08-28 DIAGNOSIS — M25552 Pain in left hip: Secondary | ICD-10-CM | POA: Diagnosis not present

## 2024-08-28 DIAGNOSIS — M545 Low back pain, unspecified: Secondary | ICD-10-CM

## 2024-08-28 DIAGNOSIS — G8929 Other chronic pain: Secondary | ICD-10-CM | POA: Diagnosis not present

## 2024-08-28 DIAGNOSIS — M51362 Other intervertebral disc degeneration, lumbar region with discogenic back pain and lower extremity pain: Secondary | ICD-10-CM | POA: Diagnosis not present

## 2024-08-28 NOTE — Progress Notes (Unsigned)
 I, Claretha Schimke am a scribe for Dr. Artist Lloyd, MD.  Regina [name] is a 83 y.o. female who presents to [facility] at [facility] today for f/u L hip and low back pain w/ MRI review. Pt was last seen by Dr. Lloyd on 07/21/24 and new XR's were obtained and MRI's ordered.  Today, pt reports that she is here to discuss MRI. Been going to physical therapy for the glute problem. Really depends on how she sleeps the night before whether or not there is pain.    Dx testing: 08/04/24 L-spine & sacrum MRI 07/21/24 L-spine & sacrum/coccyx XR 04/30/22 DEXA             03/25/21 L hip XR  Pertinent review of systems: No fevers or chills  Relevant historical information: Needle phobia   Exam:  BP 100/60   Pulse 91   Ht 5' 3 (1.6 m)   Wt 119 lb (54 kg)   LMP 08/01/2014   SpO2 96%   BMI 21.08 kg/m  General: Well Developed, well nourished, and in no acute distress.   MSK: L-spine nontender palpation midline.  Decreased lumbar motion.    Lab and Radiology Results  EXAM: MRI LUMBAR SPINE WITHOUT CONTRAST   TECHNIQUE: Multiplanar, multisequence MR imaging of the lumbar spine was performed. No intravenous contrast was administered.   COMPARISON:  Lumbar spine radiographs 07/21/2024. Lumbar MRI 04/16/2020. More recent lumbar MRI 04/06/2023 unavailable (upload unsuccessful). Chest CT 11/02/2023. Abdominopelvic CT 01/18/2020.   FINDINGS: Segmentation: Transitional lumbosacral anatomy.As correlated with prior imaging and in keeping with the numbering applied to previous MRI, there is a transitional, partially sacralized L5 segment.   Alignment: Similar convex right scoliosis. No focal angulation or listhesis.   Vertebrae: No worrisome osseous lesion, acute fracture or pars defect. Chronic endplate degenerative changes asymmetric to the left at L2-3. Endplate degenerative changes at T12-L1 have progressed from 2021. The visualized sacroiliac joints appear  unremarkable.   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 The cord is tethered and extends to the lipoma. Stable mild dilatation of the central cord canal at L1 and L2. No acute cord abnormality identified.   Paraspinal and other soft tissues: No significant paraspinal findings. Left renal cyst for which no specific follow-up imaging is recommended.   Disc levels:   T12-L1: Progressive loss of disc height with annular disc bulging and endplate osteophytes since 2021. No spinal stenosis or significant foraminal narrowing.   L1-2: Chronic loss of disc height with annular disc bulging and endplate osteophytes. No significant spinal stenosis or foraminal narrowing.   L2-3: Progressive loss of disc height with annular disc bulging and endplate osteophytes asymmetric to the left. Mild facet hypertrophy. No spinal stenosis or significant foraminal narrowing.   L3-4: Stable mild loss of disc height with mild disc bulging and bilateral facet hypertrophy. No spinal stenosis or significant foraminal narrowing.   L4-5: Stable mild loss of disc height with disc bulging and mild bilateral facet hypertrophy. No spinal stenosis or significant foraminal narrowing.   L5-S1: As numbered, this is a transitional disc space level without acquired abnormality. The spinal canal and foramina are widely patent.   IMPRESSION: 1. Transitional lumbosacral anatomy. In keeping with the numbering applied to previous MRI, there is a transitional, partially sacralized L5 segment. 2. 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 3. Progressive spondylosis at T12-L1 and L2-3 without resulting spinal stenosis or nerve root encroachment. 4. No acute findings or explanation for the patient's symptoms.     Electronically Signed   By: Elsie Perone M.D.   On: 08/10/2024  12:59  EXAM: MR Sacrum with and without intravenous contrast. 08/04/2024 02:03:22 PM   TECHNIQUE: Multiplanar multi-sequence MR imaging of the sacrum was performed with and without intravenous contrast.   COMPARISON: None Available.   CLINICAL HISTORY: Chronic sacral/SI joint pain and left hip pain.   FINDINGS:   SOFT TISSUES: The soft tissues are unremarkable.   BONES: Transitional L5 vertebra noted. Tethered cord appearance with cord-like structure with some prominence of the central canal in the lumbar spine region extending to a 1.9 x 1.6 x 1.8 cm fatty intradural mass in the S2 spinal canal compatible with lipoma of the conus/filum. No well-defined overlying disc erythritum is currently visible. Prominent caliber of the spinal canal extending down to the S2 level mass. No significant abnormal regional marrow edema. No acute fracture or focal osseous lesion.   JOINTS: SI joints unremarkable. Moderate axial degenerative chondral thinning in the left hip.   LIMITED INTRAPELVIC CONTENTS: Partially included 1.1 cm right periurethral cystic lesion nonspecific for Skene duct cyst versus urethral diverticulum, although the former is favored. Sigmoid colon diverticulosis.   IMPRESSION: 1. Tethered cord appearance with a 1.9 x 1.6 x 1.8 cm fatty intradural mass in the S2 spinal canal, compatible with lipoma of the conus/filum. No change from prior MRI of 04/16/2020. 2. Moderate axial degenerative chondral thinning in the left hip.   Electronically signed by: Ryan Salvage MD 08/10/2024 12:51 PM EDT RP Workstation: [id number] I, Artist Lloyd, personally (independently) visualized and performed the interpretation of the images attached in this note.     Assessment and Plan: 83 y.o. female with left buttocks pain.  She has had extensive treatment for piriformis syndrome and gluteus pathology without much benefit.  Based on MRI results this could be referred pain from  facet arthritis or potentially even from hip arthritis.  Typically hip arthritis would be felt in the anterior hip and her pain is a posterior.  However we could attempt a diagnostic and therapeutic hip injection to determine if the pain is coming from the hip joint or not.  She has a fair amount of needle phobia so we will use lorazepam ahead of time and have her return for an injection.  She will need a driver.   PDMP not reviewed this encounter. No orders of the defined types were placed in this encounter.  No orders of the defined types were placed in this encounter.    Discussed warning signs or symptoms. Please see discharge instructions. Patient expresses understanding.   The above documentation has been reviewed and is accurate and complete Artist Lloyd, M.D.

## 2024-08-28 NOTE — Patient Instructions (Addendum)
 Thank you for coming in today.   Take the lorazepam and schedule a hip injection in the future.

## 2024-08-29 DIAGNOSIS — M25531 Pain in right wrist: Secondary | ICD-10-CM | POA: Diagnosis not present

## 2024-08-29 DIAGNOSIS — M25641 Stiffness of right hand, not elsewhere classified: Secondary | ICD-10-CM | POA: Diagnosis not present

## 2024-08-29 DIAGNOSIS — S52571D Other intraarticular fracture of lower end of right radius, subsequent encounter for closed fracture with routine healing: Secondary | ICD-10-CM | POA: Diagnosis not present

## 2024-08-30 DIAGNOSIS — M51362 Other intervertebral disc degeneration, lumbar region with discogenic back pain and lower extremity pain: Secondary | ICD-10-CM | POA: Diagnosis not present

## 2024-09-04 DIAGNOSIS — M51362 Other intervertebral disc degeneration, lumbar region with discogenic back pain and lower extremity pain: Secondary | ICD-10-CM | POA: Diagnosis not present

## 2024-09-05 DIAGNOSIS — M25641 Stiffness of right hand, not elsewhere classified: Secondary | ICD-10-CM | POA: Diagnosis not present

## 2024-09-05 DIAGNOSIS — M25531 Pain in right wrist: Secondary | ICD-10-CM | POA: Diagnosis not present

## 2024-09-05 DIAGNOSIS — S52571D Other intraarticular fracture of lower end of right radius, subsequent encounter for closed fracture with routine healing: Secondary | ICD-10-CM | POA: Diagnosis not present

## 2024-09-12 DIAGNOSIS — M25641 Stiffness of right hand, not elsewhere classified: Secondary | ICD-10-CM | POA: Diagnosis not present

## 2024-09-12 DIAGNOSIS — S52571D Other intraarticular fracture of lower end of right radius, subsequent encounter for closed fracture with routine healing: Secondary | ICD-10-CM | POA: Diagnosis not present

## 2024-09-12 DIAGNOSIS — M51362 Other intervertebral disc degeneration, lumbar region with discogenic back pain and lower extremity pain: Secondary | ICD-10-CM | POA: Diagnosis not present

## 2024-09-19 DIAGNOSIS — M51362 Other intervertebral disc degeneration, lumbar region with discogenic back pain and lower extremity pain: Secondary | ICD-10-CM | POA: Diagnosis not present

## 2024-09-19 DIAGNOSIS — H353131 Nonexudative age-related macular degeneration, bilateral, early dry stage: Secondary | ICD-10-CM | POA: Diagnosis not present

## 2024-09-19 DIAGNOSIS — Z83511 Family history of glaucoma: Secondary | ICD-10-CM | POA: Diagnosis not present

## 2024-09-19 DIAGNOSIS — M25531 Pain in right wrist: Secondary | ICD-10-CM | POA: Diagnosis not present

## 2024-09-19 DIAGNOSIS — H524 Presbyopia: Secondary | ICD-10-CM | POA: Diagnosis not present

## 2024-09-19 DIAGNOSIS — H40003 Preglaucoma, unspecified, bilateral: Secondary | ICD-10-CM | POA: Diagnosis not present

## 2024-09-19 DIAGNOSIS — H52203 Unspecified astigmatism, bilateral: Secondary | ICD-10-CM | POA: Diagnosis not present

## 2024-09-19 DIAGNOSIS — H26492 Other secondary cataract, left eye: Secondary | ICD-10-CM | POA: Diagnosis not present

## 2024-09-19 DIAGNOSIS — M25641 Stiffness of right hand, not elsewhere classified: Secondary | ICD-10-CM | POA: Diagnosis not present

## 2024-09-19 DIAGNOSIS — H43813 Vitreous degeneration, bilateral: Secondary | ICD-10-CM | POA: Diagnosis not present

## 2024-09-21 DIAGNOSIS — M25531 Pain in right wrist: Secondary | ICD-10-CM | POA: Diagnosis not present

## 2024-09-21 DIAGNOSIS — M25641 Stiffness of right hand, not elsewhere classified: Secondary | ICD-10-CM | POA: Diagnosis not present

## 2024-09-21 DIAGNOSIS — M51362 Other intervertebral disc degeneration, lumbar region with discogenic back pain and lower extremity pain: Secondary | ICD-10-CM | POA: Diagnosis not present

## 2024-09-22 ENCOUNTER — Other Ambulatory Visit: Payer: Self-pay

## 2024-09-22 MED ORDER — LORAZEPAM 0.5 MG PO TABS: 0.5000 mg | ORAL_TABLET | Freq: Every day | ORAL | 0 refills | Status: AC | PRN

## 2024-09-22 NOTE — Telephone Encounter (Signed)
 Eoin with [facility] interested in getting pt set up for back injection.   Called pt, she has concerns about spinal injections d/t tethered chord. She is willing to try therapeutic hip injections. Has had injections with [facility] in 2021 which provided no relief. Looks like Piriformis trigger point injections and Botox injection.  Scheduled for 10/02/24, needs pre-procedure meds. Will coordinate transportation with Eoin.   Forwarding to Dr. Joane to prescribe pre-procedure meds.

## 2024-09-27 ENCOUNTER — Encounter: Payer: Self-pay | Admitting: Emergency Medicine

## 2024-09-27 ENCOUNTER — Ambulatory Visit: Admitting: Emergency Medicine

## 2024-09-27 ENCOUNTER — Ambulatory Visit (INDEPENDENT_AMBULATORY_CARE_PROVIDER_SITE_OTHER)

## 2024-09-27 VITALS — BP 120/72 | HR 76 | Temp 97.4°F | Ht 62.0 in | Wt 117.0 lb

## 2024-09-27 DIAGNOSIS — R059 Cough, unspecified: Secondary | ICD-10-CM | POA: Diagnosis not present

## 2024-09-27 DIAGNOSIS — R918 Other nonspecific abnormal finding of lung field: Secondary | ICD-10-CM

## 2024-09-27 DIAGNOSIS — J310 Chronic rhinitis: Secondary | ICD-10-CM

## 2024-09-27 DIAGNOSIS — R053 Chronic cough: Secondary | ICD-10-CM | POA: Diagnosis not present

## 2024-09-27 DIAGNOSIS — Z87891 Personal history of nicotine dependence: Secondary | ICD-10-CM | POA: Diagnosis not present

## 2024-09-27 DIAGNOSIS — J4520 Mild intermittent asthma, uncomplicated: Secondary | ICD-10-CM | POA: Diagnosis not present

## 2024-09-27 LAB — PULMONARY FUNCTION TEST
DL/VA % pred: 85 %
DL/VA: 3.51 ml/min/mmHg/L
DLCO unc % pred: 72 %
DLCO unc: 13.12 ml/min/mmHg
FEF 25-75 Post: 2.21 L/s
FEF 25-75 Pre: 1.15 L/s
FEF2575-%Change-Post: 92 %
FEF2575-%Pred-Post: 169 %
FEF2575-%Pred-Pre: 88 %
FEV1-%Change-Post: 13 %
FEV1-%Pred-Post: 96 %
FEV1-%Pred-Pre: 85 %
FEV1-Post: 1.75 L
FEV1-Pre: 1.54 L
FEV1FVC-%Change-Post: -4 %
FEV1FVC-%Pred-Pre: 103 %
FEV6-%Change-Post: 16 %
FEV6-%Pred-Post: 102 %
FEV6-%Pred-Pre: 87 %
FEV6-Post: 2.37 L
FEV6-Pre: 2.03 L
FEV6FVC-%Change-Post: -1 %
FEV6FVC-%Pred-Post: 104 %
FEV6FVC-%Pred-Pre: 106 %
FVC-%Change-Post: 18 %
FVC-%Pred-Post: 97 %
FVC-%Pred-Pre: 82 %
FVC-Post: 2.4 L
FVC-Pre: 2.03 L
Post FEV1/FVC ratio: 73 %
Post FEV6/FVC ratio: 99 %
Pre FEV1/FVC ratio: 76 %
Pre FEV6/FVC Ratio: 100 %
RV % pred: 130 %
RV: 3.08 L
TLC % pred: 106 %
TLC: 5.24 L

## 2024-09-27 LAB — CBC WITH DIFFERENTIAL/PLATELET
Basophils Absolute: 0.1 K/uL (ref 0.0–0.1)
Basophils Relative: 1.7 % (ref 0.0–3.0)
Eosinophils Absolute: 0.1 K/uL (ref 0.0–0.7)
Eosinophils Relative: 2.3 % (ref 0.0–5.0)
HCT: 37.8 % (ref 36.0–46.0)
Hemoglobin: 12.8 g/dL (ref 12.0–15.0)
Lymphocytes Relative: 30.1 % (ref 12.0–46.0)
Lymphs Abs: 1.7 K/uL (ref 0.7–4.0)
MCHC: 33.8 g/dL (ref 30.0–36.0)
MCV: 91.7 fl (ref 78.0–100.0)
Monocytes Absolute: 0.5 K/uL (ref 0.1–1.0)
Monocytes Relative: 9.4 % (ref 3.0–12.0)
Neutro Abs: 3.2 K/uL (ref 1.4–7.7)
Neutrophils Relative %: 56.5 % (ref 43.0–77.0)
Platelets: 164 K/uL (ref 150.0–400.0)
RBC: 4.12 Mil/uL (ref 3.87–5.11)
RDW: 14.4 % (ref 11.5–15.5)
WBC: 5.6 K/uL (ref 4.0–10.5)

## 2024-09-27 NOTE — Patient Instructions (Addendum)
 We need to work hard to continue to manage your sinus congestion and drainage. Start using nasal sinus saline rinses at least once daily. Continue your fluticasone  nasal spray (Flonase ) 2 sprays each nostril once daily. You can use Astelin nasal spray as needed to help with congestion and drainage Continue Singulair  (montelukast ) 10 mg each evening Continue loratadine once daily We will check blood work today to assess for markers of allergic inflammation We reviewed your pulmonary function testing today and there is evidence for mild asthma.  This might be playing a part in your symptoms especially your cough (in addition to what we know about your nasal sinus drainage, congestion).  Try using albuterol 2 puffs when you have coughing spells to see if it is beneficial.  Keep track of what it does so we can talk about it next time. Get your CT scan of the chest in January as planned Follow Dr. Shelah in about 6 weeks so we can review your CT chest, lab work and symptoms.

## 2024-09-27 NOTE — Patient Instructions (Signed)
 Full pft performed today

## 2024-09-27 NOTE — Assessment & Plan Note (Signed)
 Get your CT scan of the chest in January as planned

## 2024-09-27 NOTE — Assessment & Plan Note (Signed)
 We need to work hard to continue to manage your sinus congestion and drainage. Start using nasal sinus saline rinses at least once daily. Continue your fluticasone  nasal spray (Flonase ) 2 sprays each nostril once daily. You can use Astelin nasal spray as needed to help with congestion and drainage Continue Singulair  (montelukast ) 10 mg each evening Continue loratadine once daily We will check blood work today to assess for markers of allergic inflammation

## 2024-09-27 NOTE — Progress Notes (Signed)
 Subjective:    Patient ID: Regina [name], female    DOB: [birth year], 83 y.o.   MRN: [id number]  HPI  ROV 09/27/2024 --Regina [name] is 83 with a history of tobacco use, COPD/asthma, chronic cough impacted by chronic rhinitis and GERD.  Also with subtle ground glass nodular opacities as well as calcified granulomatous disease noted on CT scan of the chest particularly the right upper lobe.  She also has some anterior left upper lobe interstitial change likely related to breast XRT.  She is due for repeat CT in January. Her cough remains intrusive, associated with a lot of postnasal drip and rhinitis symptoms.  Pulmonary function testing was done today as below. She is on singulair , flonase  and astelin.  She reports that she had  CT sinuses done by her dentist > showed sinus fullness. No real breathing difficulty. She exercises. Her biggest annoyance is UA irritation and mucous. No GERD sx. She has albuterol.   Pulmonary function testing 09/27/2024 reviewed by me showed grossly normal airflows but evidence for obstruction given significant risk later (13% improvement in her FEV1).  Lung volumes are normal, diffusion capacity is decreased and corrects to the normal range when adjusted for alveolar volume.     Review of Systems As per HPI  Past Medical History:  Diagnosis Date   Breast cancer (HCC)    Diverticulosis    GERD (gastroesophageal reflux disease)    Headache    Heterozygous factor V Leiden mutation    Hiatal hernia    Insomnia    Nervous system disorder    spinal cord tethered   Neurogenic bladder    Osteoporosis    Osteoporosis    Tubular adenoma of colon    Urinary incontinence      Family History  Problem Relation Age of Onset   Asthma Mother    COPD Mother    Stroke Mother        TIA   Osteoporosis Mother    Hypertension Mother    Macular degeneration Mother    Glaucoma Mother    Heart attack Father    CAD Father    Skin cancer Father    CAD Brother     Melanoma Brother        dx >50   Brain cancer Maternal Uncle        dx unknown age   Heart attack Maternal Grandmother    Heart attack Maternal Grandfather    Breast cancer Paternal Grandmother 17   Prostate cancer Paternal Grandfather 37   Migraines Neg Hx    Colon cancer Neg Hx    Colon polyps Neg Hx    Liver disease Neg Hx      Social History   Socioeconomic History   Marital status: Married    Spouse name: John   Number of children: 0   Years of education: Some colle   Highest education level: Not on file  Occupational History   Occupation: Self employed   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: [employer]  Tobacco Use   Smoking status: Former    Current packs/day: 0.00    Average packs/day: 3.0 packs/day for 4.0 years (12.0 ttl pk-yrs)    Types: Cigarettes    Start date: 12/10/1968    Quit date: 12/10/1972    Years since quitting: 51.8   Smokeless tobacco: Never  Substance and Sexual Activity   Alcohol use: Yes    Alcohol/week: 0.0 standard drinks of alcohol  Comment: 2-3 glasses wine/week   Drug use: No   Sexual activity: Not on file  Other Topics Concern   Not on file  Social History Narrative   Lives w/ husband    Caffeine use: 2-3 cups coffee per day   Social Drivers of Health   Tobacco Use: Medium Risk (09/27/2024)   Patient History    Smoking Tobacco Use: Former    Smokeless Tobacco Use: Never    Passive Exposure: Not on Actuary Strain: Not on file  Food Insecurity: No Food Insecurity (06/11/2023)   Hunger Vital Sign    Worried About Running Out of Food in the Last Year: Never true    Ran Out of Food in the Last Year: Never true  Transportation Needs: No Transportation Needs (06/11/2023)   PRAPARE - Administrator, Civil Service (Medical): No    Lack of Transportation (Non-Medical): No  Physical Activity: Not on file  Stress: Not on file  Social Connections: Not on file  Intimate Partner Violence: Not At Risk  (06/11/2023)   Humiliation, Afraid, Rape, and Kick questionnaire    Fear of Current or Ex-Partner: No    Emotionally Abused: No    Physically Abused: No    Sexually Abused: No  Depression (PHQ2-9): Low Risk (06/11/2023)   Depression (PHQ2-9)    PHQ-2 Score: 0  Alcohol Screen: Not on file  Housing: Low Risk (06/11/2023)   Housing    Last Housing Risk Score: 0  Utilities: Not At Risk (06/11/2023)   AHC Utilities    Threatened with loss of utilities: No  Health Literacy: Adequate Health Literacy (07/14/2023)   B1300 Health Literacy    Frequency of need for help with medical instructions: Never     Allergies  Allergen Reactions   Nsaids Anaphylaxis and Other (See Comments)    Severe GI irritation.  Acute Gastritis  Severe GI irritation.   Ketoprofen     Stomach issues   Tramadol Itching   Macrodantin [Nitrofurantoin Macrocrystal] Rash   Oxycodone Itching   Venlafaxine Itching     Outpatient Medications Prior to Visit  Medication Sig Dispense Refill   albuterol (VENTOLIN HFA) 108 (90 Base) MCG/ACT inhaler Inhale into the lungs every 6 (six) hours as needed for wheezing or shortness of breath.     Ascorbic Acid (VITAMIN C) 1000 MG tablet Take 1,000 mg by mouth daily.      aspirin 81 MG EC tablet Take 81 mg by mouth 3 (three) times a week.     atorvastatin  (LIPITOR) 10 MG tablet Take 1 tablet (10 mg total) by mouth daily. 90 tablet 3   Azelastine HCl 137 MCG/SPRAY SOLN Place 137 mcg into both nostrils in the morning and at bedtime.     b complex vitamins capsule Take 1 capsule by mouth daily.     calcium  citrate-vitamin D (CITRACAL+D) 315-200 MG-UNIT tablet Take 1 tablet by mouth every morning.     Cholecalciferol (VITAMIN D3) 2000 units TABS Take 2,000 Units by mouth daily.     fluticasone  (FLONASE  SENSIMIST) 27.5 MCG/SPRAY nasal spray Place 2 sprays into the nose daily.     fluticasone  (FLONASE ) 50 MCG/ACT nasal spray Place into both nostrils daily.     LORazepam  (ATIVAN ) 0.5 MG  tablet Take 1 tablet (0.5 mg total) by mouth daily as needed for anxiety (dentist). Pre-procedure medication - take 30 min prior to hip injection. 4 tablet 0   Lutein 6 MG CAPS Take 1 capsule  by mouth with breakfast, with lunch, and with evening meal.     Lysine 1000 MG TABS Take 1 tablet by mouth daily.     Magnesium 500 MG CAPS Take 1 capsule by mouth daily.     methocarbamol (ROBAXIN) 500 MG tablet Take 500 mg by mouth every 6 (six) hours as needed for muscle spasms.     montelukast  (SINGULAIR ) 10 MG tablet Take 1 tablet (10 mg total) by mouth at bedtime. 90 tablet 3   Omega-3 Fatty Acids (OMEGA 3 500 PO) Take 1 tablet by mouth daily at 6 (six) AM.     omeprazole (PRILOSEC) 40 MG capsule Take 40 mg by mouth daily.     OVER THE COUNTER MEDICATION Take 3 tablets by mouth with breakfast, with lunch, and with evening meal. Bromelain Sinus Ease     tamoxifen  (NOLVADEX ) 10 MG tablet Take 1 tablet (10 mg total) by mouth daily. 90 tablet 3   Vibegron (GEMTESA) 75 MG TABS Take 1 tablet by mouth daily.     Zinc Sulfate (ZINC-220 PO) Take 1 tablet by mouth daily at 6 (six) AM.     No facility-administered medications prior to visit.         Objective:   Physical Exam Vitals:   09/27/24 1104  BP: 120/72  Pulse: 76  Temp: (!) 97.4 F (36.3 C)  TempSrc: Temporal  SpO2: 100%  Weight: 117 lb (53.1 kg)  Height: 5' 2 (1.575 m)     Gen: Pleasant, well-nourished, in no distress,  normal affect  ENT: No lesions,  mouth clear,  oropharynx clear, no postnasal drip  Neck: No JVD, no stridor  Lungs: No use of accessory muscles, no crackles or wheezing on normal respiration, no wheeze on forced expiration  Cardiovascular: RRR, heart sounds normal, no murmur or gallops, no peripheral edema  Musculoskeletal: No deformities, no cyanosis or clubbing  Neuro: alert, awake, non focal  Skin: Warm, no lesions or rash       Assessment & Plan:  Cough Being impacted principally by her sinus  congestion and drainage.  She wonders based on CT scan of the sinus with her dentist whether she may have chronic sinus disease that would need intervention by ENT.  No significant GERD.  Her pulmonary function testing confirmed mild asthma and this could be a component of her cough although it seems that it is more upper airway in nature based on her description.  We will work to treat rhinitis aggressively, also do a trial of albuterol during coughing to see if there is an impact.  I think she may benefit from Biologics to impact both components of her cough and we will check an IgE and eosinophil count  Chronic rhinitis We need to work hard to continue to manage your sinus congestion and drainage. Start using nasal sinus saline rinses at least once daily. Continue your fluticasone  nasal spray (Flonase ) 2 sprays each nostril once daily. You can use Astelin nasal spray as needed to help with congestion and drainage Continue Singulair  (montelukast ) 10 mg each evening Continue loratadine once daily We will check blood work today to assess for markers of allergic inflammation  Asthma, mild intermittent Mild obstruction confirmed on her pulmonary function testing today.  As above will do a trial of albuterol with coughing to see if there is an impact.  Both mild asthma and her allergy disease might be benefited by Biologics.  We are checking IgE and eosinophil count.  We reviewed your  pulmonary function testing today and there is evidence for mild asthma.  This might be playing a part in your symptoms especially your cough (in addition to what we know about your nasal sinus drainage, congestion).  Try using albuterol 2 puffs when you have coughing spells to see if it is beneficial.  Keep track of what it does so we can talk about it next time.  Pulmonary nodules Get your CT scan of the chest in January as planned   I personally spent a total of 44 minutes in the care of the patient today including  preparing to see the patient, getting/reviewing separately obtained history, performing a medically appropriate exam/evaluation, counseling and educating, placing orders, documenting clinical information in the EHR, independently interpreting results, and communicating results.    Lamar Chris, MD, PhD 09/27/2024, 11:49 AM [facility] [phone] or if no answer before 7:00PM call [phone] For any issues after 7:00PM please call eLink [phone]

## 2024-09-27 NOTE — Assessment & Plan Note (Signed)
 Mild obstruction confirmed on her pulmonary function testing today.  As above will do a trial of albuterol with coughing to see if there is an impact.  Both mild asthma and her allergy disease might be benefited by Biologics.  We are checking IgE and eosinophil count.  We reviewed your pulmonary function testing today and there is evidence for mild asthma.  This might be playing a part in your symptoms especially your cough (in addition to what we know about your nasal sinus drainage, congestion).  Try using albuterol 2 puffs when you have coughing spells to see if it is beneficial.  Keep track of what it does so we can talk about it next time.

## 2024-09-27 NOTE — Progress Notes (Signed)
 Full pft performed today

## 2024-09-27 NOTE — Assessment & Plan Note (Signed)
 Being impacted principally by her sinus congestion and drainage.  She wonders based on CT scan of the sinus with her dentist whether she may have chronic sinus disease that would need intervention by ENT.  No significant GERD.  Her pulmonary function testing confirmed mild asthma and this could be a component of her cough although it seems that it is more upper airway in nature based on her description.  We will work to treat rhinitis aggressively, also do a trial of albuterol during coughing to see if there is an impact.  I think she may benefit from Biologics to impact both components of her cough and we will check an IgE and eosinophil count

## 2024-09-28 LAB — IGE: IgE (Immunoglobulin E), Serum: <2 kU/L (ref <=114)

## 2024-10-02 ENCOUNTER — Ambulatory Visit: Admitting: Family Medicine

## 2024-10-02 ENCOUNTER — Encounter: Payer: Self-pay | Admitting: Family Medicine

## 2024-10-02 ENCOUNTER — Other Ambulatory Visit: Payer: Self-pay

## 2024-10-02 VITALS — BP 112/74 | HR 63 | Ht 62.0 in | Wt 117.0 lb

## 2024-10-02 DIAGNOSIS — M25552 Pain in left hip: Secondary | ICD-10-CM | POA: Diagnosis not present

## 2024-10-02 NOTE — Progress Notes (Signed)
"       ° °  I, Leotis Batter, CMA acting as a scribe for Artist Lloyd, MD.  Regina [name] is a 83 y.o. female who presents to [facility] at [facility] today for f/u L hip pain. Pt was last seen by Dr. Lloyd on 08/28/24 and was prescribed lorazepam  to take prior to hip injection.  Today, pt reports continued hip pain. Sx causing night disturbance. Has taken pre-procedure meds.   Dx testing: 08/26/24 DEXA 08/04/24 L-spine & sacrum MRI 07/21/24 L-spine & sacrum/coccyx XR 04/30/22 DEXA             03/25/21 L hip XR  Pertinent review of systems: No fevers or chills  Relevant historical information: Migraine headache   Exam:  BP 112/74   Pulse 63   Ht 5' 2 (1.575 m)   Wt 117 lb (53.1 kg)   LMP 08/01/2014   SpO2 97%   BMI 21.40 kg/m  General: Well Developed, well nourished, and in no acute distress.   MSK: Left hip normal motion    Lab and Radiology Results  Procedure: Real-time Ultrasound Guided Injection of left hip femoral acetabular joint anterior approach Device: Philips Affiniti 50G/GE Logiq Images permanently stored and available for review in PACS Verbal informed consent obtained.  Discussed risks and benefits of procedure. Warned about infection, bleeding, hyperglycemia damage to structures among others. Patient expresses understanding and agreement Time-out conducted.   Noted no overlying erythema, induration, or other signs of local infection.   Skin prepped in a sterile fashion.   Local anesthesia: Topical Ethyl chloride.   With sterile technique and under real time ultrasound guidance: 40 mg of Kenalog and 2 mL of Marcaine  injected into hip joint. Fluid seen entering the joint capsule.   Completed without difficulty   Pain moderately immediately resolved suggesting accurate placement of the medication.   Advised to call if fevers/chills, erythema, induration, drainage, or persistent bleeding.   Images permanently stored and available for review in  the ultrasound unit.  Impression: Technically successful ultrasound guided injection.        Assessment and Plan: 83 y.o. female with chronic left hip pain.  Etiology is challenging to pin down.  Diagnostic and therapeutic intra-articular hip injection performed today with some results.  Patient will report back how she feels over the next several hours.  Hopefully the steroid component will provide some lasting benefit.   PDMP not reviewed this encounter. Orders Placed This Encounter  Procedures   US  LIMITED JOINT SPACE STRUCTURES LOW LEFT(NO LINKED CHARGES)    Reason for Exam (SYMPTOM  OR DIAGNOSIS REQUIRED):   left hip pain    Preferred imaging location?:   [facility]   No orders of the defined types were placed in this encounter.    Discussed warning signs or symptoms. Please see discharge instructions. Patient expresses understanding.   The above documentation has been reviewed and is accurate and complete Artist Lloyd, M.D.   "

## 2024-10-02 NOTE — Patient Instructions (Addendum)
 Thank you for coming in today.   You received an injection today. Seek immediate medical attention if the joint becomes red, extremely painful, or is oozing fluid.   Report back how you feel with your hip numbed up  Check back as needed

## 2024-10-17 ENCOUNTER — Telehealth: Payer: Self-pay | Admitting: Cardiothoracic Surgery

## 2024-10-20 ENCOUNTER — Ambulatory Visit (HOSPITAL_BASED_OUTPATIENT_CLINIC_OR_DEPARTMENT_OTHER)
Admission: RE | Admit: 2024-10-20 | Discharge: 2024-10-20 | Disposition: A | Source: Ambulatory Visit | Attending: Adult Health | Admitting: Adult Health

## 2024-10-20 DIAGNOSIS — R053 Chronic cough: Secondary | ICD-10-CM | POA: Diagnosis present

## 2024-10-20 DIAGNOSIS — R918 Other nonspecific abnormal finding of lung field: Secondary | ICD-10-CM | POA: Diagnosis present

## 2024-10-27 ENCOUNTER — Ambulatory Visit: Payer: Self-pay | Admitting: Adult Health

## 2024-10-27 DIAGNOSIS — R918 Other nonspecific abnormal finding of lung field: Secondary | ICD-10-CM

## 2024-11-15 ENCOUNTER — Telehealth: Payer: Self-pay | Admitting: Family Medicine

## 2024-11-15 NOTE — Telephone Encounter (Signed)
 Pt just wanted to report to Dr. Joane that the hip injection she recd on 10/02/2024 is wearing off. Offered to make appt, pt declined until she receives some direction from Dr. Joane.

## 2024-11-17 ENCOUNTER — Other Ambulatory Visit: Payer: Self-pay | Admitting: Emergency Medicine

## 2025-06-11 ENCOUNTER — Ambulatory Visit: Admitting: Hematology and Oncology
